# Patient Record
Sex: Female | Born: 1945 | Race: White | Hispanic: No | Smoking: Former smoker
Health system: Southern US, Community
[De-identification: ages and names within clinical notes are randomized; demographics above are authoritative.]

## PROBLEM LIST (undated history)

## (undated) DIAGNOSIS — K7460 Unspecified cirrhosis of liver: Secondary | ICD-10-CM

## (undated) DIAGNOSIS — I8500 Esophageal varices without bleeding: Secondary | ICD-10-CM

## (undated) DIAGNOSIS — C73 Malignant neoplasm of thyroid gland: Secondary | ICD-10-CM

## (undated) DIAGNOSIS — F0390 Unspecified dementia without behavioral disturbance: Secondary | ICD-10-CM

## (undated) DIAGNOSIS — K219 Gastro-esophageal reflux disease without esophagitis: Secondary | ICD-10-CM

## (undated) DIAGNOSIS — I10 Essential (primary) hypertension: Secondary | ICD-10-CM

## (undated) DIAGNOSIS — R413 Other amnesia: Secondary | ICD-10-CM

## (undated) DIAGNOSIS — F319 Bipolar disorder, unspecified: Secondary | ICD-10-CM

## (undated) DIAGNOSIS — S62109A Fracture of unspecified carpal bone, unspecified wrist, initial encounter for closed fracture: Secondary | ICD-10-CM

## (undated) DIAGNOSIS — C801 Malignant (primary) neoplasm, unspecified: Secondary | ICD-10-CM

## (undated) DIAGNOSIS — S0990XA Unspecified injury of head, initial encounter: Secondary | ICD-10-CM

## (undated) DIAGNOSIS — I6529 Occlusion and stenosis of unspecified carotid artery: Secondary | ICD-10-CM

## (undated) HISTORY — DX: Fracture of unspecified carpal bone, unspecified wrist, initial encounter for closed fracture: S62.109A

## (undated) HISTORY — DX: Malignant (primary) neoplasm, unspecified: C80.1

## (undated) HISTORY — DX: Malignant neoplasm of thyroid gland: C73

## (undated) HISTORY — DX: Gastro-esophageal reflux disease without esophagitis: K21.9

## (undated) HISTORY — DX: Essential (primary) hypertension: I10

## (undated) HISTORY — DX: Unspecified injury of head, initial encounter: S09.90XA

## (undated) HISTORY — DX: Bipolar disorder, unspecified: F31.9

## (undated) HISTORY — DX: Other amnesia: R41.3

## (undated) HISTORY — PX: SKIN CANCER EXCISION: SHX779

---

## 2001-07-23 LAB — HM MAMMOGRAPHY

## 2004-03-02 LAB — HM COLONOSCOPY: HM Colonoscopy: DECREASED

## 2004-12-02 HISTORY — PX: CHOLECYSTECTOMY: SHX55

## 2005-01-07 ENCOUNTER — Other Ambulatory Visit: Payer: Self-pay

## 2005-01-08 ENCOUNTER — Inpatient Hospital Stay: Payer: Self-pay

## 2005-01-09 ENCOUNTER — Other Ambulatory Visit: Payer: Self-pay

## 2005-01-31 ENCOUNTER — Emergency Department: Payer: Self-pay | Admitting: Emergency Medicine

## 2005-01-31 ENCOUNTER — Other Ambulatory Visit: Payer: Self-pay

## 2005-02-02 ENCOUNTER — Inpatient Hospital Stay: Payer: Self-pay | Admitting: Internal Medicine

## 2005-02-02 ENCOUNTER — Other Ambulatory Visit: Payer: Self-pay

## 2005-02-06 ENCOUNTER — Ambulatory Visit: Payer: Self-pay | Admitting: Internal Medicine

## 2005-03-05 ENCOUNTER — Inpatient Hospital Stay: Payer: Self-pay

## 2005-03-05 ENCOUNTER — Ambulatory Visit: Payer: Self-pay | Admitting: Nurse Practitioner

## 2005-09-20 ENCOUNTER — Ambulatory Visit: Payer: Self-pay | Admitting: Internal Medicine

## 2005-10-15 ENCOUNTER — Ambulatory Visit: Payer: Self-pay | Admitting: *Deleted

## 2005-10-21 ENCOUNTER — Ambulatory Visit: Payer: Self-pay | Admitting: Internal Medicine

## 2005-11-15 ENCOUNTER — Ambulatory Visit (HOSPITAL_COMMUNITY): Admission: RE | Admit: 2005-11-15 | Discharge: 2005-11-15 | Payer: Self-pay | Admitting: Internal Medicine

## 2005-12-02 HISTORY — PX: HERNIA REPAIR: SHX51

## 2005-12-02 HISTORY — PX: CORONARY STENT PLACEMENT: SHX1402

## 2006-01-23 ENCOUNTER — Other Ambulatory Visit: Payer: Self-pay

## 2006-01-30 ENCOUNTER — Ambulatory Visit: Payer: Self-pay | Admitting: General Surgery

## 2006-02-27 ENCOUNTER — Emergency Department: Payer: Self-pay | Admitting: Emergency Medicine

## 2006-05-05 ENCOUNTER — Emergency Department: Payer: Self-pay | Admitting: Emergency Medicine

## 2006-10-14 ENCOUNTER — Ambulatory Visit (HOSPITAL_COMMUNITY): Admission: RE | Admit: 2006-10-14 | Discharge: 2006-10-14 | Payer: Self-pay | Admitting: Internal Medicine

## 2006-12-02 HISTORY — PX: NASAL SINUS SURGERY: SHX719

## 2006-12-02 HISTORY — PX: COLONOSCOPY: SHX174

## 2007-03-13 ENCOUNTER — Ambulatory Visit: Payer: Self-pay | Admitting: Internal Medicine

## 2008-07-01 ENCOUNTER — Ambulatory Visit: Payer: Self-pay | Admitting: Internal Medicine

## 2008-07-01 ENCOUNTER — Encounter (INDEPENDENT_AMBULATORY_CARE_PROVIDER_SITE_OTHER): Payer: Self-pay | Admitting: Family Medicine

## 2008-07-01 LAB — CONVERTED CEMR LAB
ALT: 20 units/L (ref 0–35)
AST: 29 units/L (ref 0–37)
Albumin: 4.3 g/dL (ref 3.5–5.2)
Alkaline Phosphatase: 97 units/L (ref 39–117)
BUN: 12 mg/dL (ref 6–23)
Basophils Absolute: 0 10*3/uL (ref 0.0–0.1)
Basophils Relative: 1 % (ref 0–1)
CO2: 23 meq/L (ref 19–32)
Calcium: 8.5 mg/dL (ref 8.4–10.5)
Chloride: 107 meq/L (ref 96–112)
Cholesterol: 128 mg/dL (ref 0–200)
Creatinine, Ser: 0.74 mg/dL (ref 0.40–1.20)
Eosinophils Absolute: 0.1 10*3/uL (ref 0.0–0.7)
Eosinophils Relative: 2 % (ref 0–5)
Glucose, Bld: 103 mg/dL — ABNORMAL HIGH (ref 70–99)
HCT: 34.6 % — ABNORMAL LOW (ref 36.0–46.0)
HDL: 45 mg/dL (ref >39)
Hemoglobin: 11.5 g/dL — ABNORMAL LOW (ref 12.0–15.0)
LDL Cholesterol: 51 mg/dL (ref 0–99)
Lymphocytes Relative: 30 % (ref 12–46)
Lymphs Abs: 1.2 10*3/uL (ref 0.7–4.0)
MCHC: 33.2 g/dL (ref 30.0–36.0)
MCV: 98.3 fL (ref 78.0–100.0)
Monocytes Absolute: 0.4 10*3/uL (ref 0.1–1.0)
Monocytes Relative: 9 % (ref 3–12)
Neutro Abs: 2.4 10*3/uL (ref 1.7–7.7)
Neutrophils Relative %: 58 % (ref 43–77)
Platelets: 135 10*3/uL — ABNORMAL LOW (ref 150–400)
Potassium: 4.1 meq/L (ref 3.5–5.3)
RBC: 3.52 M/uL — ABNORMAL LOW (ref 3.87–5.11)
RDW: 13.9 % (ref 11.5–15.5)
Sodium: 141 meq/L (ref 135–145)
TSH: 1.882 microintl units/mL (ref 0.350–4.50)
Total Bilirubin: 0.9 mg/dL (ref 0.3–1.2)
Total CHOL/HDL Ratio: 2.8
Total Protein: 6.7 g/dL (ref 6.0–8.3)
Triglycerides: 160 mg/dL — ABNORMAL HIGH (ref <150)
VLDL: 32 mg/dL (ref 0–40)
WBC: 4.1 10*3/uL (ref 4.0–10.5)

## 2008-07-04 ENCOUNTER — Ambulatory Visit (HOSPITAL_COMMUNITY): Admission: RE | Admit: 2008-07-04 | Discharge: 2008-07-04 | Payer: Self-pay | Admitting: Family Medicine

## 2008-07-19 ENCOUNTER — Ambulatory Visit: Payer: Self-pay

## 2008-07-21 ENCOUNTER — Ambulatory Visit: Payer: Self-pay | Admitting: Otolaryngology

## 2008-07-28 ENCOUNTER — Ambulatory Visit: Payer: Self-pay | Admitting: Otolaryngology

## 2008-09-08 ENCOUNTER — Ambulatory Visit: Payer: Self-pay | Admitting: Family Medicine

## 2009-12-29 LAB — HM PAP SMEAR: HM Pap smear: NEGATIVE

## 2010-07-11 ENCOUNTER — Ambulatory Visit: Payer: Self-pay | Admitting: Cardiology

## 2010-12-02 HISTORY — PX: THYROIDECTOMY: SHX17

## 2011-01-15 ENCOUNTER — Ambulatory Visit: Payer: Self-pay | Admitting: General Surgery

## 2011-01-17 ENCOUNTER — Other Ambulatory Visit: Payer: Self-pay | Admitting: General Surgery

## 2011-01-18 LAB — CEA: CEA: 32.1 ng/mL — ABNORMAL HIGH (ref 0.0–4.7)

## 2011-01-23 ENCOUNTER — Ambulatory Visit: Payer: Self-pay | Admitting: General Surgery

## 2011-01-23 DIAGNOSIS — C73 Malignant neoplasm of thyroid gland: Secondary | ICD-10-CM

## 2011-01-23 HISTORY — DX: Malignant neoplasm of thyroid gland: C73

## 2011-01-25 ENCOUNTER — Other Ambulatory Visit: Payer: Self-pay | Admitting: General Surgery

## 2011-01-31 LAB — PATHOLOGY REPORT

## 2011-05-09 ENCOUNTER — Other Ambulatory Visit: Payer: Self-pay | Admitting: General Surgery

## 2011-06-09 ENCOUNTER — Emergency Department: Payer: Self-pay | Admitting: Emergency Medicine

## 2013-02-27 ENCOUNTER — Other Ambulatory Visit: Payer: Self-pay | Admitting: General Surgery

## 2013-02-27 ENCOUNTER — Emergency Department: Payer: Self-pay | Admitting: Emergency Medicine

## 2013-03-01 NOTE — Telephone Encounter (Signed)
Please approve or deny this refill request.

## 2013-03-02 ENCOUNTER — Inpatient Hospital Stay: Payer: Self-pay | Admitting: Internal Medicine

## 2013-03-02 LAB — COMPREHENSIVE METABOLIC PANEL
Anion Gap: 6 — ABNORMAL LOW (ref 7–16)
BUN: 14 mg/dL (ref 7–18)
Bilirubin,Total: 0.8 mg/dL (ref 0.2–1.0)
Calcium, Total: 8.3 mg/dL — ABNORMAL LOW (ref 8.5–10.1)
Chloride: 105 mmol/L (ref 98–107)
Glucose: 97 mg/dL (ref 65–99)
Osmolality: 276 (ref 275–301)
Potassium: 4.1 mmol/L (ref 3.5–5.1)
SGPT (ALT): 45 U/L (ref 12–78)
Total Protein: 7.1 g/dL (ref 6.4–8.2)

## 2013-03-02 LAB — CBC
HCT: 34.8 % — ABNORMAL LOW (ref 35.0–47.0)
MCH: 32.7 pg (ref 26.0–34.0)
MCHC: 33.6 g/dL (ref 32.0–36.0)
Platelet: 95 10*3/uL — ABNORMAL LOW (ref 150–440)
RBC: 3.59 10*6/uL — ABNORMAL LOW (ref 3.80–5.20)
RDW: 14.1 % (ref 11.5–14.5)
WBC: 3.4 10*3/uL — ABNORMAL LOW (ref 3.6–11.0)

## 2013-03-02 LAB — URINALYSIS, COMPLETE
Glucose,UR: NEGATIVE mg/dL (ref 0–75)
Nitrite: NEGATIVE
Ph: 6 (ref 4.5–8.0)
Protein: NEGATIVE
Specific Gravity: 1.005 (ref 1.003–1.030)
Squamous Epithelial: 1

## 2013-03-02 LAB — TROPONIN I: Troponin-I: <0.02 ng/mL

## 2013-03-03 LAB — CBC WITH DIFFERENTIAL/PLATELET
Eosinophil #: 0 10*3/uL (ref 0.0–0.7)
Eosinophil %: 2.3 %
HGB: 10.6 g/dL — ABNORMAL LOW (ref 12.0–16.0)
Lymphocyte #: 0.8 10*3/uL — ABNORMAL LOW (ref 1.0–3.6)
Lymphocyte %: 38.3 %
MCH: 33.2 pg (ref 26.0–34.0)
MCHC: 34.1 g/dL (ref 32.0–36.0)
Monocyte #: 0.3 x10 3/mm (ref 0.2–0.9)
Monocyte %: 12.3 %
Neutrophil #: 1 10*3/uL — ABNORMAL LOW (ref 1.4–6.5)
RDW: 13.9 % (ref 11.5–14.5)
WBC: 2.1 10*3/uL — ABNORMAL LOW (ref 3.6–11.0)

## 2013-03-03 LAB — BASIC METABOLIC PANEL
Anion Gap: 6 — ABNORMAL LOW (ref 7–16)
BUN: 10 mg/dL (ref 7–18)
Calcium, Total: 7.6 mg/dL — ABNORMAL LOW (ref 8.5–10.1)
Creatinine: 0.87 mg/dL (ref 0.60–1.30)
EGFR (African American): >60
EGFR (Non-African Amer.): >60
Osmolality: 283 (ref 275–301)
Potassium: 3.9 mmol/L (ref 3.5–5.1)

## 2013-03-04 LAB — CBC WITH DIFFERENTIAL/PLATELET
Basophil #: 0 10*3/uL (ref 0.0–0.1)
Basophil %: 0.3 %
Comment - H1-Com1: NORMAL
Comment - H1-Com2: NORMAL
Eosinophil #: 0 10*3/uL (ref 0.0–0.7)
Eosinophil %: 2.6 %
HGB: 10.6 g/dL — ABNORMAL LOW (ref 12.0–16.0)
Lymphocyte #: 0.6 10*3/uL — ABNORMAL LOW (ref 1.0–3.6)
MCH: 32.4 pg (ref 26.0–34.0)
MCHC: 33.4 g/dL (ref 32.0–36.0)
MCV: 97 fL (ref 80–100)
Monocytes: 7 %
Neutrophil %: 49.5 %
Platelet: 58 10*3/uL — ABNORMAL LOW (ref 150–440)
RDW: 13.9 % (ref 11.5–14.5)

## 2013-03-04 LAB — BASIC METABOLIC PANEL
BUN: 7 mg/dL (ref 7–18)
Calcium, Total: 7.4 mg/dL — ABNORMAL LOW (ref 8.5–10.1)
Chloride: 110 mmol/L — ABNORMAL HIGH (ref 98–107)
Creatinine: 0.88 mg/dL (ref 0.60–1.30)
EGFR (Non-African Amer.): >60
Osmolality: 279 (ref 275–301)

## 2013-03-04 LAB — RETICULOCYTES
Absolute Retic Count: 0.0398 10*6/uL
Reticulocyte: 1.24 %

## 2013-03-04 LAB — TSH: Thyroid Stimulating Horm: 4.47 u[IU]/mL

## 2013-03-05 LAB — STOOL CULTURE

## 2013-03-05 LAB — BASIC METABOLIC PANEL
BUN: 6 mg/dL — ABNORMAL LOW (ref 7–18)
Calcium, Total: 7.6 mg/dL — ABNORMAL LOW (ref 8.5–10.1)
Chloride: 112 mmol/L — ABNORMAL HIGH (ref 98–107)
Creatinine: 0.74 mg/dL (ref 0.60–1.30)
EGFR (African American): >60
EGFR (Non-African Amer.): >60
Sodium: 143 mmol/L (ref 136–145)

## 2013-03-05 LAB — CBC WITH DIFFERENTIAL/PLATELET
Comment - H1-Com1: NORMAL
Comment - H1-Com2: NORMAL
Eosinophil: 1 %
HCT: 31.8 % — ABNORMAL LOW (ref 35.0–47.0)
HGB: 10.6 g/dL — ABNORMAL LOW (ref 12.0–16.0)
Lymphocytes: 44 %
MCH: 32.3 pg (ref 26.0–34.0)
Monocytes: 5 %
RBC: 3.29 10*6/uL — ABNORMAL LOW (ref 3.80–5.20)
Segmented Neutrophils: 50 %

## 2013-03-06 LAB — CBC WITH DIFFERENTIAL/PLATELET
Basophil #: 0 10*3/uL (ref 0.0–0.1)
Basophil %: 0.4 %
Eosinophil %: 1.9 %
HCT: 33 % — ABNORMAL LOW (ref 35.0–47.0)
HGB: 11.2 g/dL — ABNORMAL LOW (ref 12.0–16.0)
Lymphocyte #: 0.9 10*3/uL — ABNORMAL LOW (ref 1.0–3.6)
Lymphocyte %: 29.6 %
MCH: 32.8 pg (ref 26.0–34.0)
MCHC: 34.1 g/dL (ref 32.0–36.0)
MCV: 96 fL (ref 80–100)
Monocyte #: 0.3 x10 3/mm (ref 0.2–0.9)
Monocyte %: 9.1 %
Neutrophil #: 1.7 10*3/uL (ref 1.4–6.5)
Neutrophil %: 59 %
RBC: 3.43 10*6/uL — ABNORMAL LOW (ref 3.80–5.20)
RDW: 14 % (ref 11.5–14.5)

## 2013-03-06 LAB — BASIC METABOLIC PANEL
Calcium, Total: 8.1 mg/dL — ABNORMAL LOW (ref 8.5–10.1)
Chloride: 110 mmol/L — ABNORMAL HIGH (ref 98–107)
Co2: 26 mmol/L (ref 21–32)
EGFR (African American): >60
Glucose: 92 mg/dL (ref 65–99)
Osmolality: 280 (ref 275–301)
Potassium: 3.9 mmol/L (ref 3.5–5.1)

## 2013-06-01 ENCOUNTER — Encounter: Payer: Self-pay | Admitting: *Deleted

## 2013-08-16 ENCOUNTER — Emergency Department: Payer: Self-pay | Admitting: Emergency Medicine

## 2013-08-16 LAB — CBC
HGB: 11 g/dL — ABNORMAL LOW (ref 12.0–16.0)
MCH: 32.4 pg (ref 26.0–34.0)
MCV: 97 fL (ref 80–100)
Platelet: 93 10*3/uL — ABNORMAL LOW (ref 150–440)
RDW: 14.4 % (ref 11.5–14.5)
WBC: 6.4 10*3/uL (ref 3.6–11.0)

## 2013-08-16 LAB — BASIC METABOLIC PANEL
BUN: 14 mg/dL (ref 7–18)
Calcium, Total: 8.6 mg/dL (ref 8.5–10.1)
EGFR (African American): >60
Glucose: 109 mg/dL — ABNORMAL HIGH (ref 65–99)
Potassium: 4 mmol/L (ref 3.5–5.1)
Sodium: 139 mmol/L (ref 136–145)

## 2013-08-16 LAB — TROPONIN I: Troponin-I: <0.02 ng/mL

## 2013-08-16 LAB — PROTIME-INR: Prothrombin Time: 14.8 secs — ABNORMAL HIGH (ref 11.5–14.7)

## 2013-08-19 ENCOUNTER — Ambulatory Visit: Payer: Self-pay | Admitting: Vascular Surgery

## 2013-11-06 ENCOUNTER — Other Ambulatory Visit: Payer: Self-pay | Admitting: General Surgery

## 2013-12-16 ENCOUNTER — Other Ambulatory Visit: Payer: Self-pay

## 2013-12-16 DIAGNOSIS — Z85850 Personal history of malignant neoplasm of thyroid: Secondary | ICD-10-CM

## 2014-01-07 LAB — CALCITONIN: Calcitonin: 5 pg/mL (ref 0.0–5.0)

## 2014-01-07 LAB — T4 AND TSH
T4, Total: 10.2 ug/dL (ref 4.5–12.0)
TSH: 15.59 u[IU]/mL — ABNORMAL HIGH (ref 0.450–4.500)

## 2014-01-07 LAB — CEA: CEA: 2.5 ng/mL (ref 0.0–4.7)

## 2014-01-12 ENCOUNTER — Encounter: Payer: Self-pay | Admitting: General Surgery

## 2014-01-12 ENCOUNTER — Ambulatory Visit (INDEPENDENT_AMBULATORY_CARE_PROVIDER_SITE_OTHER): Payer: Medicare Other | Admitting: General Surgery

## 2014-01-12 VITALS — BP 140/66 | HR 76 | Resp 14 | Ht 63.0 in | Wt 137.0 lb

## 2014-01-12 DIAGNOSIS — Z85850 Personal history of malignant neoplasm of thyroid: Secondary | ICD-10-CM

## 2014-01-12 NOTE — Patient Instructions (Signed)
The patient is aware to call back for any questions or concerns.  

## 2014-01-12 NOTE — Progress Notes (Signed)
Patient ID: Margaret Romero, female   DOB: 13-Jun-1946, 68 y.o.   MRN: [id number]  Chief Complaint  Patient presents with  . Follow-up    thyroid cancer    HPI Margaret Romero is a 68 y.o. female.  Here today for follow up thyroid cancer.  The patient reports she is feeling well. She is accompanied by her daughter and).  Followed by Dr Delana Meyer for carotid artery last visit was November, no surgery needed. No new complaints. HPI  Past Medical History  Diagnosis Date  . Heart disease   . Hypertension   . Bipolar disorder   . 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 GERD (gastroesophageal reflux disease)   . Malignant neoplasm of thyroid gland January 23, 2011    medullary carcinoma thyroid T2, Nx.    Past Surgical History  Procedure Laterality Date  . Hernia repair  2007  . Coronary stent placement  2007  . Cholecystectomy  2006  . Thyroidectomy  2012  . Colonoscopy  2008  . Nasal sinus surgery  2008    No family history on file.  Social History History  Substance Use Topics  . Smoking status: Former Smoker -- 1.00 packs/day for 10 years  . Smokeless tobacco: Never Used  . Alcohol Use: No    No Known Allergies  Current Outpatient Prescriptions  Medication Sig Dispense Refill  . aspirin 81 MG tablet Take 81 mg by mouth daily.      . DELZICOL 400 MG CPDR DR capsule       . FLUoxetine (PROZAC) 20 MG capsule Take 1 capsule by mouth 3 x daily with food.      . isosorbide mononitrate (IMDUR) 30 MG 24 hr tablet Take 1 tablet by mouth daily.      Marland Kitchen levothyroxine (SYNTHROID, LEVOTHROID) 75 MCG tablet TAKE ONE TABLET BY MOUTH EVERY DAY  30 tablet  6  . losartan (COZAAR) 50 MG tablet Take 1 tablet by mouth daily.      . Multiple Vitamins-Minerals (MULTIVITAMIN WITH MINERALS) tablet Take 1 tablet by mouth daily.      . pantoprazole (PROTONIX) 40 MG tablet Take 1 tablet by mouth daily.      . simvastatin (ZOCOR) 40 MG tablet Take 1  tablet by mouth daily.       No current facility-administered medications for this visit.    Review of Systems Review of Systems  Constitutional: Negative.   Respiratory: Negative.   Cardiovascular: Negative.     Blood pressure 140/66, pulse 76, resp. rate 14, height 5' 3"  (1.6 m), weight 137 lb (62.143 kg).  Physical Exam Physical Exam  Constitutional: She is oriented to person, place, and time. She appears well-developed and well-nourished.  Neck: Neck supple. No mass (No palpable thyroidal tissue is appreciated.no adenopathy in the central neck appreciated.) present.  Cardiovascular: Normal rate, regular rhythm and normal heart sounds.   Pulmonary/Chest: [facility] normal and breath sounds normal.  Lymphadenopathy:    She has no cervical adenopathy.  Neurological: She is alert and oriented to person, place, and time.  Skin: Skin is warm and dry.    Data Reviewed Laboratory studies show T4 in a normal range of 10.2. TSH was elevated 15. Calcitonin was at the upper limit of normal at 5.0 pg/mL. CEA: 2.5.  Assessment    Doing well status post total thyroidectomy for medullary carcinoma thyroid.     Plan    We'll plan for  a followup examination with repeat laboratory studies in one year.        [name] 01/13/2014, 8:12 PM

## 2014-01-13 ENCOUNTER — Encounter: Payer: Self-pay | Admitting: General Surgery

## 2014-01-13 DIAGNOSIS — Z85850 Personal history of malignant neoplasm of thyroid: Secondary | ICD-10-CM | POA: Insufficient documentation

## 2014-01-17 ENCOUNTER — Encounter: Payer: Self-pay | Admitting: General Surgery

## 2014-04-08 ENCOUNTER — Emergency Department: Payer: Self-pay | Admitting: Emergency Medicine

## 2014-07-22 LAB — BASIC METABOLIC PANEL
BUN: 10 mg/dL (ref 4–21)
Creatinine: 0.8 mg/dL (ref 0.5–1.1)
Glucose: 99 mg/dL
POTASSIUM: 3.7 mmol/L (ref 3.4–5.3)
Sodium: 146 mmol/L (ref 137–147)

## 2014-07-22 LAB — CBC AND DIFFERENTIAL
HCT: 12 % — AB (ref 36–46)
HEMOGLOBIN: 10.8 g/dL — AB (ref 12.0–16.0)
PLATELETS: 119 10*3/uL — AB (ref 150–399)
WBC: 5.8 10*3/mL

## 2014-09-01 ENCOUNTER — Ambulatory Visit: Payer: Self-pay | Admitting: Vascular Surgery

## 2014-09-14 ENCOUNTER — Ambulatory Visit: Payer: Self-pay | Admitting: Vascular Surgery

## 2014-09-14 LAB — BASIC METABOLIC PANEL
Anion Gap: 3 — ABNORMAL LOW (ref 7–16)
BUN: 10 mg/dL (ref 7–18)
CHLORIDE: 112 mmol/L — AB (ref 98–107)
CO2: 31 mmol/L (ref 21–32)
Calcium, Total: 8.2 mg/dL — ABNORMAL LOW (ref 8.5–10.1)
Creatinine: 0.85 mg/dL (ref 0.60–1.30)
EGFR (African American): >60
EGFR (Non-African Amer.): >60
Glucose: 88 mg/dL (ref 65–99)
OSMOLALITY: 289 (ref 275–301)
Potassium: 3.9 mmol/L (ref 3.5–5.1)
SODIUM: 146 mmol/L — AB (ref 136–145)

## 2014-09-30 ENCOUNTER — Ambulatory Visit: Payer: Self-pay | Admitting: Vascular Surgery

## 2014-10-02 ENCOUNTER — Emergency Department: Payer: Self-pay | Admitting: Emergency Medicine

## 2014-10-02 HISTORY — PX: CAROTID ENDARTERECTOMY: SUR193

## 2014-10-03 ENCOUNTER — Encounter: Payer: Self-pay | Admitting: General Surgery

## 2014-10-05 ENCOUNTER — Ambulatory Visit: Payer: Self-pay | Admitting: Vascular Surgery

## 2014-10-05 LAB — CBC WITH DIFFERENTIAL/PLATELET
BASOS ABS: 0 10*3/uL (ref 0.0–0.1)
BASOS PCT: 0.5 %
EOS ABS: 0.1 10*3/uL (ref 0.0–0.7)
EOS PCT: 1.7 %
HCT: 32.5 % — ABNORMAL LOW (ref 35.0–47.0)
HGB: 10.5 g/dL — ABNORMAL LOW (ref 12.0–16.0)
LYMPHS ABS: 0.8 10*3/uL — AB (ref 1.0–3.6)
LYMPHS PCT: 22.9 %
MCH: 31.3 pg (ref 26.0–34.0)
MCHC: 32.2 g/dL (ref 32.0–36.0)
MCV: 97 fL (ref 80–100)
Monocyte #: 0.2 x10 3/mm (ref 0.2–0.9)
Monocyte %: 6.9 %
NEUTROS PCT: 68 %
Neutrophil #: 2.3 10*3/uL (ref 1.4–6.5)
PLATELETS: 87 10*3/uL — AB (ref 150–440)
RBC: 3.35 10*6/uL — ABNORMAL LOW (ref 3.80–5.20)
RDW: 15.7 % — AB (ref 11.5–14.5)
WBC: 3.4 10*3/uL — ABNORMAL LOW (ref 3.6–11.0)

## 2014-10-11 ENCOUNTER — Ambulatory Visit: Payer: Self-pay | Admitting: Vascular Surgery

## 2014-10-14 ENCOUNTER — Inpatient Hospital Stay: Payer: Self-pay | Admitting: Vascular Surgery

## 2014-10-15 LAB — CBC WITH DIFFERENTIAL/PLATELET
Basophil #: 0 10*3/uL (ref 0.0–0.1)
Basophil %: 0.3 %
EOS ABS: 0 10*3/uL (ref 0.0–0.7)
EOS PCT: 0.1 %
HCT: 31.3 % — AB (ref 35.0–47.0)
HGB: 10.1 g/dL — AB (ref 12.0–16.0)
LYMPHS PCT: 17 %
Lymphocyte #: 0.7 10*3/uL — ABNORMAL LOW (ref 1.0–3.6)
MCH: 31.2 pg (ref 26.0–34.0)
MCHC: 32.2 g/dL (ref 32.0–36.0)
MCV: 97 fL (ref 80–100)
MONO ABS: 0.3 x10 3/mm (ref 0.2–0.9)
MONOS PCT: 5.8 %
Neutrophil #: 3.3 10*3/uL (ref 1.4–6.5)
Neutrophil %: 76.8 %
Platelet: 77 10*3/uL — ABNORMAL LOW (ref 150–440)
RBC: 3.24 10*6/uL — AB (ref 3.80–5.20)
RDW: 15.5 % — AB (ref 11.5–14.5)
WBC: 4.3 10*3/uL (ref 3.6–11.0)

## 2014-10-15 LAB — BASIC METABOLIC PANEL
ANION GAP: 8 (ref 7–16)
BUN: 15 mg/dL (ref 7–18)
CHLORIDE: 110 mmol/L — AB (ref 98–107)
Calcium, Total: 7.6 mg/dL — ABNORMAL LOW (ref 8.5–10.1)
Co2: 27 mmol/L (ref 21–32)
Creatinine: 0.99 mg/dL (ref 0.60–1.30)
EGFR (African American): >60
GFR CALC NON AF AMER: 59 — AB
GLUCOSE: 94 mg/dL (ref 65–99)
Osmolality: 289 (ref 275–301)
POTASSIUM: 4.1 mmol/L (ref 3.5–5.1)
Sodium: 145 mmol/L (ref 136–145)

## 2014-10-15 LAB — APTT: Activated PTT: 31.6 secs (ref 23.6–35.9)

## 2014-10-15 LAB — PROTIME-INR
INR: 1.3
Prothrombin Time: 15.5 secs — ABNORMAL HIGH (ref 11.5–14.7)

## 2014-10-15 LAB — DG CHEST 1 VIEW

## 2014-10-17 DIAGNOSIS — J9601 Acute respiratory failure with hypoxia: Secondary | ICD-10-CM

## 2014-10-17 DIAGNOSIS — I340 Nonrheumatic mitral (valve) insufficiency: Secondary | ICD-10-CM

## 2014-10-19 ENCOUNTER — Telehealth: Payer: Self-pay | Admitting: Internal Medicine

## 2014-10-19 NOTE — Telephone Encounter (Signed)
I spoke w/ [facility] @ [facility].  VM has not seen pt outside of [facility].  I gave [facility] VM's pager number as instructed by Joellen Jersey.  Nothing further needed at this time.  Satira Anis

## 2014-10-19 NOTE — Telephone Encounter (Signed)
ATC x 3 fast busy signal wcb

## 2014-10-20 ENCOUNTER — Telehealth: Payer: Self-pay | Admitting: Internal Medicine

## 2014-10-20 NOTE — Telephone Encounter (Signed)
Spoke with the pt's caregiver  She states that the pt was advised by Dr. Stevenson Clinch to only use o2 2lpm with exertion  Then, 2 days later when she was d/c'ed from [facility], her d/c instructions were to use the o2 2lpm 24/7  Pt is doing well with this, but wonders which is correct- the prn use of o2 or 24/7 use  She has HFU in scheduled for 10/31/14 with Dr Stevenson Clinch I advised to use the o2 as they instructed on d/c until further notice  She verbalized understanding and okay with this plan  Dr Stevenson Clinch, please advise, thanks!!

## 2014-10-20 NOTE — Telephone Encounter (Signed)
Based on results from her partial 20mn walk test in the hospital she will require 2L of O2 with exertion (any exertion) and 1L PRN at rest. Orders will be adjusted once she follows up with me.

## 2014-10-21 ENCOUNTER — Telehealth: Payer: Self-pay | Admitting: Internal Medicine

## 2014-10-21 NOTE — Telephone Encounter (Signed)
Duplicate phone note. Please see note from 10/20/14. Will sign off.

## 2014-10-21 NOTE — Telephone Encounter (Signed)
Pt returned call. Informed Flonnie of the recs per VM. Flonnie verbalized understanding with readback. Nothing further needed.

## 2014-10-21 NOTE — Telephone Encounter (Signed)
LMTCB for [employer]

## 2014-10-31 ENCOUNTER — Encounter: Payer: Self-pay | Admitting: Internal Medicine

## 2014-10-31 ENCOUNTER — Telehealth: Payer: Self-pay | Admitting: *Deleted

## 2014-10-31 ENCOUNTER — Inpatient Hospital Stay: Payer: Self-pay | Admitting: Internal Medicine

## 2014-10-31 ENCOUNTER — Ambulatory Visit (INDEPENDENT_AMBULATORY_CARE_PROVIDER_SITE_OTHER): Payer: Medicare Other | Admitting: Internal Medicine

## 2014-10-31 VITALS — BP 142/60 | HR 69 | Ht 63.0 in | Wt 140.0 lb

## 2014-10-31 DIAGNOSIS — R0602 Shortness of breath: Secondary | ICD-10-CM

## 2014-10-31 DIAGNOSIS — R0902 Hypoxemia: Secondary | ICD-10-CM

## 2014-10-31 NOTE — Patient Instructions (Addendum)
We will set you up for a breathing test at [facility]. We will get you set up for overnight oximetry as well. Please schedule a  6-8 week follow-up with Dr. Stevenson Clinch. You may discontinue use of oxygen.

## 2014-10-31 NOTE — Progress Notes (Deleted)
blank

## 2014-10-31 NOTE — Telephone Encounter (Signed)
I called patient per Dr. Merian Capron direction. He feels that the patient could be best evaluated in one month. He would like to reschedule today's office visit for 2 more weeks out. If the patient feels that she needs to be seen today and or has made preparations he will still see her. I left message on her daughter's number.

## 2014-10-31 NOTE — Progress Notes (Signed)
Resting hr 97 Resting Pulse 69 02   HR 02 HR 96 81 95 94  96 89 95 95  95 93 96 91  95 94 96 82 94 91

## 2014-11-01 ENCOUNTER — Encounter: Payer: Self-pay | Admitting: Internal Medicine

## 2014-11-01 DIAGNOSIS — R0602 Shortness of breath: Secondary | ICD-10-CM | POA: Insufficient documentation

## 2014-11-01 DIAGNOSIS — R0902 Hypoxemia: Secondary | ICD-10-CM | POA: Insufficient documentation

## 2014-11-01 NOTE — Assessment & Plan Note (Addendum)
Postop secondary to atelectasis. Now resolving-not requiring oxygen at rest or with exertion. STOP supplemental oxygen (no desaturation events on 6MWT) Plan for overnight pulse oximetry testing.

## 2014-11-01 NOTE — Progress Notes (Signed)
MRN# [id number] Margaret Romero [birth year]   CC:"follow up from hospital and see if I still need oxygen" Chief Complaint  Patient presents with  . Hospitalization Follow-up    Patient here for 6 min walk eval to monitor 02 levels. She has not used 02 for the past 2 nights. She has not used since 11/21 during the day.      Brief History: Ms. Dunford presents today for hospital follow-up, along with her friend, who is HCPOA. See below for Hospital history. Patient was recently admitted for right carotid endarterectomy, postop she was noted to be hypoxic, mainly with ambulation descending down to 78%, and initially requiring 2 L of oxygen with exertion. She was also requiring 1 L of oxygen at night due to desats at sleep.  Upon discharge, she needed 1 L of oxygen with exertion and sleep. CT during hospitalization showed bilateral basal atelectasis. In the office for 6 minute walk test showed no desaturations. Patient states overall she is doing well, and has a portable pulse oximetry monitor with her at times , that has shown no desaturation at rest or with exertion over the past 1 week.  She does have a history of smoking, quit in 1975, previously smoked 1-2 packs per day. Prior to hospitalization she had no cough, no wheeze, no sputum production, she has mild dyspnea on exertion, no shortness of breath at rest.   Hospital Consult HPI : Date of Service - 10/14/14-10/19/14 10/17/14 This is a very pleasant, 68 year old female with a history of carotid artery stenosis, postoperative day #4 for right CEA, hypertension, hypothyroidism. [facility] was consulted for postoperative hypoxia especially with ambulation. Since her procedure on 10/15/14, she was noted to be about 87% to 91% on room air, then started desaturation over the weekend.  Currently, she has desaturation on room air, but with exertion.  A CT Chest showed no PE but moderate basilar bilateral atelecatsis and small bilateral pleural  effusion, with diffuse centrilobular emphysema. . The patient is a very active woman, who does gardening and has no history of lung disease. She had no respiratory issues prior to coming into the hospital. She said she was doing fine. No cough, wheezing, hemoptysis.  She states that she smoked 1-2ppd x 15 years, quit in 1975, has occasional second-hand smoke exposure (once every 3 weeks, she does house cleaning at times). She lives with a friend who does not smoke.       PMHX:   Past Medical History  Diagnosis Date  . Heart disease   . Hypertension   . Bipolar disorder   . 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 GERD (gastroesophageal reflux disease)   . Malignant neoplasm of thyroid gland January 23, 2011    medullary carcinoma thyroid T2, Nx.   Surgical Hx:  Past Surgical History  Procedure Laterality Date  . Hernia repair  2007  . Coronary stent placement  2007  . Cholecystectomy  2006  . Thyroidectomy  2012  . Colonoscopy  2008  . Nasal sinus surgery  2008   Family Hx:  No family history on file. Social Hx:   History  Substance Use Topics  . Smoking status: Former Smoker -- 1.00 packs/day for 10 years  . Smokeless tobacco: Never Used  . Alcohol Use: No   Medication:   Current Outpatient Rx  Name  Route  Sig  Dispense  Refill  . aspirin 81 MG tablet   Oral  Take 81 mg by mouth daily.         . DELZICOL 400 MG CPDR DR capsule               . escitalopram (LEXAPRO) 10 MG tablet   Oral   Take 10 mg by mouth daily.         . Influenza Vac Split High-Dose (FLUZONE HIGH-DOSE) 0.5 ML SUSY   Intramuscular   Inject 0.5 mLs into the muscle once.         . isosorbide mononitrate (IMDUR) 30 MG 24 hr tablet   Oral   Take 1 tablet by mouth daily.         Marland Kitchen levothyroxine (SYNTHROID, LEVOTHROID) 75 MCG tablet      TAKE ONE TABLET BY MOUTH EVERY DAY   30 tablet   6   . losartan (COZAAR) 50 MG tablet   Oral    Take 1 tablet by mouth daily.         . Multiple Vitamins-Minerals (MULTIVITAMIN WITH MINERALS) tablet   Oral   Take 1 tablet by mouth daily.         . pantoprazole (PROTONIX) 40 MG tablet   Oral   Take 1 tablet by mouth daily.         . simvastatin (ZOCOR) 40 MG tablet   Oral   Take 1 tablet by mouth daily.         . nitroGLYCERIN (NITROSTAT) 0.4 MG SL tablet   Sublingual   Place 0.4 mg under the tongue as needed.            Review of Systems: Gen:  Denies  fever, sweats, chills HEENT: Denies blurred vision, double vision, ear pain, eye pain, hearing loss, nose bleeds, sore throat Cvc:  No dizziness, chest pain or heaviness Resp:   Mild sob Gi: Denies swallowing difficulty, stomach pain, nausea or vomiting, diarrhea, constipation, bowel incontinence Gu:  Denies bladder incontinence, burning urine Ext:   No Joint pain, stiffness or swelling Skin: No skin rash, easy bruising or bleeding or hives Endoc:  No polyuria, polydipsia , polyphagia or weight change Psych: No depression, insomnia or hallucinations  Other:  All other systems negative  Allergies:  Review of patient's allergies indicates no known allergies.  Physical Examination:  VS: BP 142/60 mmHg  Pulse 69  Ht 5' 3"  (1.6 m)  Wt 140 lb (63.504 kg)  BMI 24.81 kg/m2  SpO2 96%  General Appearance: No distress  HEENT: PERRLA, EOM intact, no ptosis, Right sided neck scar (5inches) with sutures in place, mild erythema, no drainage Pulmonary:Exam: normal breath sounds., diaphragmatic excursion normal.No wheezing, No rales   Cardiovascular:@ Exam:  Normal S1,S2.  No m/r/g.     Abdomen:Exam: Benign, Soft, non-tender, No masses  Skin:   warm, no rashes, no ecchymosis  Extremities: normal, no cyanosis, clubbing, no edema, warm with normal capillary refill.   Labs results:  BMP Lab Results  Component Value Date   NA 141 07/01/2008   K 4.1 07/01/2008   CL 107 07/01/2008   CO2 23 07/01/2008   GLUCOSE 103*  07/01/2008   BUN 12 07/01/2008   CREATININE 0.74 07/01/2008     CBC CBC Latest Ref Rng 07/01/2008  WBC 4.0-10.5 10*3/microliter 4.1  Hemoglobin 12.0-15.0 g/dL 11.5(L)  Hematocrit 36.0-46.0 % 34.6(L)  Platelets 150-400 K/uL 135(L)     Rad results: (The following images and results were reviewed by Dr. Stevenson Clinch). CT chest 10/16/2014 COMPARISON:  Multiple chest radiographs,  including 1 day prior. No prior CT.  FINDINGS: Lungs/Pleura: Mild motion degradation throughout. Moderate centrilobular emphysema.  Biapical pleural parenchymal scarring, greater on the right.  Right greater than left bibasilar airspace disease. Right greater left small bilateral pleural effusions.  Heart/Mediastinum: The quality of this examination for evaluation of pulmonary embolism is good. No evidence of pulmonary embolism.  Advanced carotid atherosclerosis bilaterally. Prior thyroidectomy. Small left low jugular nodes which were suboptimally evaluated.  Advanced atherosclerosis within the aorta and branch vessels, including the left common carotid and subclavian arteries. Mild cardiomegaly, without pericardial effusion. Multivessel coronary artery atherosclerosis. No mediastinal or hilar adenopathy. Small hiatal hernia. Prevascular mediastinal interstitial thickening is nonspecific, including on image 48 of series 3. Upper Abdomen: Advanced cirrhosis. Probable splenomegaly. Nonspecific hypo attenuating 12 mm low-density central splenic lesion. Normal imaged portions of the stomach, adrenal glands, kidneys. Tortuous enlarged portal vein, suboptimally evaluated secondary to bolus timing. Mildly prominent porta hepatis nodes which are likely reactive.  Bones/Musculoskeletal:  Mild wedging of T8 and T9 vertebral bodies.  Review of the MIP images confirms the above findings.   IMPRESSION: 1.  No evidence of pulmonary embolism. 2. Bibasilar airspace disease is suspicious for infection or aspiration.  Small bilateral pleural effusions. Recommend radiographic follow-up until clearing. 3. Advanced cirrhosis with probable portal venous hypertension. 4. Advanced atherosclerosis, including within the coronary arteries. 5. Interstitial thickening in the anterior mediastinum. Likely related to recent surgery.  ECHO 10/17/14 Summary:  1. Left ventricular ejection fraction, by visual estimation, is 60 to  65%.  2. Normal global left ventricular systolic function.  3. Normal right ventricular size and systolic function.  4. Moderately dilated left atrium.  5. Mild to moderate mitral valve regurgitation.  6. Mild tricuspid regurgitation.  7. Mildly elevated pulmonary artery systolic pressure.     Assessment and Plan: Hypoxia Postop secondary to atelectasis. Now resolving-not requiring oxygen at rest or with exertion. STOP supplemental oxygen (no desaturation events on 6MWT) Plan for overnight pulse oximetry testing.  SOB (shortness of breath) on exertion Multifactorial: Postoperative atelectasis, history of smoking, deconditioning, possible COPD  Patient is now improving, shortness of breath is almost resolved, and she is almost back to baseline breathing.  Plan: - Patient does have a previous significant smoking history, may have some obstructive disease. Will plan for further evaluation with full pulmonary function testing. - Overnight pulse oximetry - Discuss sleep habits and screened for sleep apnea at next visit.   Updated Medication List Outpatient Encounter Prescriptions as of 10/31/2014  Medication Sig  . aspirin 81 MG tablet Take 81 mg by mouth daily.  . DELZICOL 400 MG CPDR DR capsule   . escitalopram (LEXAPRO) 10 MG tablet Take 10 mg by mouth daily.  . Influenza Vac Split High-Dose (FLUZONE HIGH-DOSE) 0.5 ML SUSY Inject 0.5 mLs into the muscle once.  . isosorbide mononitrate (IMDUR) 30 MG 24 hr tablet Take 1 tablet by mouth daily.  Marland Kitchen levothyroxine (SYNTHROID,  LEVOTHROID) 75 MCG tablet TAKE ONE TABLET BY MOUTH EVERY DAY  . losartan (COZAAR) 50 MG tablet Take 1 tablet by mouth daily.  . Multiple Vitamins-Minerals (MULTIVITAMIN WITH MINERALS) tablet Take 1 tablet by mouth daily.  . pantoprazole (PROTONIX) 40 MG tablet Take 1 tablet by mouth daily.  . simvastatin (ZOCOR) 40 MG tablet Take 1 tablet by mouth daily.  . nitroGLYCERIN (NITROSTAT) 0.4 MG SL tablet Place 0.4 mg under the tongue as needed.  . [DISCONTINUED] FLUoxetine (PROZAC) 20 MG capsule Take 1 capsule by mouth 3  x daily with food.    Orders for this visit: Orders Placed This Encounter  Procedures  . Ambulatory Referral for DME    Referral Priority:  Routine    Referral Type:  Durable Medical Equipment Purchase    Number of Visits Requested:  1  . Ambulatory referral to Respiratory Therapy    Referral Priority:  Routine    Referral Type:  Consultation    Referral Reason:  Specialty Services Required    Requested Specialty:  Respiratory Therapy    Number of Visits Requested:  1  . Pulmonary function test    Standing Status: Future     Number of Occurrences:      Standing Expiration Date: 11/01/2015    Scheduling Instructions:     PLEASE SCHEDULE MID JANU 2016. DX DYSPNEA    Order Specific Question:  Where should this test be performed?    Answer:  Other    Order Specific Question:  Full PFT: includes the following: basic spirometry, spirometry pre & post bronchodilator, diffusion capacity (DLCO), lung volumes    Answer:  Full PFT    Order Specific Question:  MIP/MEP    Answer:  No    Order Specific Question:  6 minute walk    Answer:  No    Order Specific Question:  ABG    Answer:  No    Order Specific Question:  Diffusion capacity (DLCO)    Answer:  No    Order Specific Question:  Lung volumes    Answer:  No    Order Specific Question:  Methacholine challenge    Answer:  No    Thank  you for the visitation and for allowing  [facility] to assist  in the care of your patient. Our recommendations are noted above.  Please contact us if we can be of further service.  Vilinda Boehringer, MD [facility] Office Number: [phone]

## 2014-11-01 NOTE — Assessment & Plan Note (Signed)
Multifactorial: Postoperative atelectasis, history of smoking, deconditioning, possible COPD  Patient is now improving, shortness of breath is almost resolved, and she is almost back to baseline breathing.  Plan: - Patient does have a previous significant smoking history, may have some obstructive disease. Will plan for further evaluation with full pulmonary function testing. - Overnight pulse oximetry - Discuss sleep habits and screened for sleep apnea at next visit.

## 2014-11-09 ENCOUNTER — Telehealth: Payer: Self-pay | Admitting: Internal Medicine

## 2014-11-09 DIAGNOSIS — R0902 Hypoxemia: Secondary | ICD-10-CM

## 2014-11-09 NOTE — Telephone Encounter (Signed)
Called and spoke with [facility] and she stated that Dr. Stevenson Clinch stopped the pt from using the [place].  She stated that the pt will have to make 2 more co-pays for the oxygen and would like to have [facility] come and pick this up if she is not going to be on this.  Dr. Stevenson Clinch please advise. Thanks  No Known Allergies  Current Outpatient Prescriptions on File Prior to Visit  Medication Sig Dispense Refill  . aspirin 81 MG tablet Take 81 mg by mouth daily.    . DELZICOL 400 MG CPDR DR capsule     . escitalopram (LEXAPRO) 10 MG tablet Take 10 mg by mouth daily.    . Influenza Vac Split High-Dose (FLUZONE HIGH-DOSE) 0.5 ML SUSY Inject 0.5 mLs into the muscle once.    . isosorbide mononitrate (IMDUR) 30 MG 24 hr tablet Take 1 tablet by mouth daily.    Marland Kitchen levothyroxine (SYNTHROID, LEVOTHROID) 75 MCG tablet TAKE ONE TABLET BY MOUTH EVERY DAY 30 tablet 6  . losartan (COZAAR) 50 MG tablet Take 1 tablet by mouth daily.    . Multiple Vitamins-Minerals (MULTIVITAMIN WITH MINERALS) tablet Take 1 tablet by mouth daily.    . nitroGLYCERIN (NITROSTAT) 0.4 MG SL tablet Place 0.4 mg under the tongue as needed.    . pantoprazole (PROTONIX) 40 MG tablet Take 1 tablet by mouth daily.    . simvastatin (ZOCOR) 40 MG tablet Take 1 tablet by mouth daily.     No current facility-administered medications on file prior to visit.

## 2014-11-10 ENCOUNTER — Other Ambulatory Visit: Payer: Self-pay | Admitting: Internal Medicine

## 2014-11-10 DIAGNOSIS — J9601 Acute respiratory failure with hypoxia: Secondary | ICD-10-CM

## 2014-11-10 NOTE — Telephone Encounter (Signed)
Patient does not require oxygen. She needed it temporarily after her surgery, but now is back to her baseline respiratory status. D\C oxygen and return tanks to DME.  See my clinic note for full details.

## 2014-11-10 NOTE — Addendum Note (Signed)
Addended by: Elie Confer on: 11/10/2014 11:10 AM   Modules accepted: Orders

## 2014-11-30 ENCOUNTER — Telehealth: Payer: Self-pay | Admitting: Internal Medicine

## 2014-12-01 NOTE — Telephone Encounter (Signed)
Left message to call back. x1

## 2014-12-05 NOTE — Telephone Encounter (Signed)
PFT has been scheduled for 12/22/14 at 4pm.

## 2014-12-07 ENCOUNTER — Other Ambulatory Visit: Payer: Self-pay

## 2014-12-07 DIAGNOSIS — Z85850 Personal history of malignant neoplasm of thyroid: Secondary | ICD-10-CM

## 2014-12-14 ENCOUNTER — Telehealth: Payer: Self-pay | Admitting: Internal Medicine

## 2014-12-14 NOTE — Telephone Encounter (Signed)
Margaret Pique do you know why this pt was called?  i cant find anything in the chart.

## 2014-12-15 DIAGNOSIS — K5190 Ulcerative colitis, unspecified, without complications: Secondary | ICD-10-CM | POA: Diagnosis not present

## 2014-12-15 DIAGNOSIS — R195 Other fecal abnormalities: Secondary | ICD-10-CM | POA: Insufficient documentation

## 2014-12-15 DIAGNOSIS — D649 Anemia, unspecified: Secondary | ICD-10-CM | POA: Insufficient documentation

## 2014-12-15 NOTE — Telephone Encounter (Signed)
Dr. Merian Capron schedule changed on 12/22/14. Ria Comment called this patient because he is having morning clinic only. This patient was scheduled for PFT that afternoon that needs to be rescheduled. Thanks.

## 2014-12-15 NOTE — Telephone Encounter (Signed)
Pt needed morning appt for pft. Scheduled & per Ria Comment closed out the note. spm

## 2014-12-22 ENCOUNTER — Ambulatory Visit (INDEPENDENT_AMBULATORY_CARE_PROVIDER_SITE_OTHER): Payer: Medicare Other | Admitting: Internal Medicine

## 2014-12-22 ENCOUNTER — Encounter: Payer: Self-pay | Admitting: Internal Medicine

## 2014-12-22 VITALS — BP 150/52 | HR 76 | Ht 63.0 in | Wt 140.0 lb

## 2014-12-22 DIAGNOSIS — R0602 Shortness of breath: Secondary | ICD-10-CM | POA: Diagnosis not present

## 2014-12-22 LAB — PULMONARY FUNCTION TEST
DL/VA % pred: 50 %
DL/VA: 2.39 ml/min/mmHg/L
DLCO unc % pred: 59 %
DLCO unc: 13.55 ml/min/mmHg
FEF 25-75 Post: 2.6 L/sec
FEF 25-75 Pre: 1.26 L/sec
FEF2575-%Change-Post: 105 %
FEF2575-%Pred-Post: 142 %
FEF2575-%Pred-Pre: 69 %
FEV1-%Change-Post: 19 %
FEV1-%Pred-Post: 112 %
FEV1-%Pred-Pre: 93 %
FEV1-POST: 2.42 L
FEV1-Pre: 2.02 L
FEV1FVC-%CHANGE-POST: 5 %
FEV1FVC-%Pred-Pre: 92 %
FEV6-%Change-Post: 15 %
FEV6-%PRED-PRE: 104 %
FEV6-%Pred-Post: 119 %
FEV6-Post: 3.28 L
FEV6-Pre: 2.84 L
FEV6FVC-%Change-Post: 1 %
FEV6FVC-%Pred-Post: 105 %
FEV6FVC-%Pred-Pre: 103 %
FVC-%Change-Post: 13 %
FVC-%PRED-PRE: 100 %
FVC-%Pred-Post: 114 %
FVC-PRE: 2.88 L
FVC-Post: 3.28 L
Post FEV1/FVC ratio: 74 %
Post FEV6/FVC ratio: 100 %
Pre FEV1/FVC ratio: 70 %
Pre FEV6/FVC Ratio: 99 %
RV % pred: 122 %
RV: 2.63 L
TLC % pred: 123 %
TLC: 6.06 L

## 2014-12-22 MED ORDER — ALBUTEROL SULFATE HFA 108 (90 BASE) MCG/ACT IN AERS: 2.0000 | INHALATION_SPRAY | RESPIRATORY_TRACT | Status: DC | PRN

## 2014-12-22 NOTE — Progress Notes (Signed)
PFT performed today. 

## 2014-12-22 NOTE — Progress Notes (Signed)
MRN# [id number] Harrison G Abel 1945/12/29   CC: Chief Complaint  Patient presents with  . Shortness of Breath    PFT done today.      Brief History: HPI 10/31/14 - Ms. Reames presents today for hospital follow-up, along with her friend, who is HCPOA. See below for Hospital history. Patient was recently admitted for right carotid endarterectomy, postop she was noted to be hypoxic, mainly with ambulation descending down to 78%, and initially requiring 2 L of oxygen with exertion. She was also requiring 1 L of oxygen at night due to desats at sleep. Upon discharge, she needed 1 L of oxygen with exertion and sleep. CT during hospitalization showed bilateral basal atelectasis. In the office for 6 minute walk test showed no desaturations. Patient states overall she is doing well, and has a portable pulse oximetry monitor with her at times , that has shown no desaturation at rest or with exertion over the past 1 week.  She does have a history of smoking, quit in 1975, previously smoked 1-2 packs per day. Prior to hospitalization she had no cough, no wheeze, no sputum production, she has mild dyspnea on exertion, no shortness of breath at rest. PLAN - PFTs, ONO   Events since last clinic visit: She presents today for followup visit. She is accompanied by her health care power of attorney (best friend). Patient states that she is doing well, she is back to her baseline level of activities which includes gardening and being out in the yard. She denies any dyspnea on exertion, cough, wheeze, shortness of breath at rest, fever, night sweats, sudden weight loss. At her last visit given her significant smoking history and postop hypoxia a PFT and overnight pulse ox study was ordered. Today patient would like the results of her ordered studies from her last visit. Overall she states she is doing well.      PMHX:   Past Medical History  Diagnosis Date  . Heart disease   . Hypertension   .  Bipolar disorder   . 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 GERD (gastroesophageal reflux disease)   . Malignant neoplasm of thyroid gland January 23, 2011    medullary carcinoma thyroid T2, Nx.   Surgical Hx:  Past Surgical History  Procedure Laterality Date  . Hernia repair  2007  . Coronary stent placement  2007  . Cholecystectomy  2006  . Thyroidectomy  2012  . Colonoscopy  2008  . Nasal sinus surgery  2008   Family Hx:  No family history on file. Social Hx:   History  Substance Use Topics  . Smoking status: Former Smoker -- 1.00 packs/day for 10 years  . Smokeless tobacco: Never Used  . Alcohol Use: No   Medication:   Current Outpatient Rx  Name  Route  Sig  Dispense  Refill  . aspirin 81 MG tablet   Oral   Take 81 mg by mouth daily.         . DELZICOL 400 MG CPDR DR capsule   Oral   Take 400 mg by mouth 3 (three) times daily.          Marland Kitchen escitalopram (LEXAPRO) 10 MG tablet   Oral   Take 10 mg by mouth daily.         . isosorbide mononitrate (IMDUR) 30 MG 24 hr tablet   Oral   Take 1 tablet by mouth daily.         Marland Kitchen  levothyroxine (SYNTHROID, LEVOTHROID) 75 MCG tablet      TAKE ONE TABLET BY MOUTH EVERY DAY   30 tablet   6   . losartan (COZAAR) 50 MG tablet   Oral   Take 1 tablet by mouth daily.         . Multiple Vitamins-Minerals (MULTIVITAMIN WITH MINERALS) tablet   Oral   Take 1 tablet by mouth daily.         . nitroGLYCERIN (NITROSTAT) 0.4 MG SL tablet   Sublingual   Place 0.4 mg under the tongue as needed.         . pantoprazole (PROTONIX) 40 MG tablet   Oral   Take 1 tablet by mouth daily.         . simvastatin (ZOCOR) 40 MG tablet   Oral   Take 1 tablet by mouth daily.         Marland Kitchen albuterol (PROVENTIL HFA;VENTOLIN HFA) 108 (90 BASE) MCG/ACT inhaler   Inhalation   Inhale 2 puffs into the lungs every 4 (four) hours as needed for wheezing or shortness of breath.   1 Inhaler    5      Review of Systems: Gen:  Denies  fever, sweats, chills HEENT: Denies blurred vision, double vision, ear pain, eye pain, hearing loss, nose bleeds, sore throat Cvc:  No dizziness, chest pain or heaviness Resp:   Denies cough or sputum porduction, shortness of breath Gi: Denies swallowing difficulty, stomach pain, nausea or vomiting, diarrhea, constipation, bowel incontinence Gu:  Denies bladder incontinence, burning urine Ext:   No Joint pain, stiffness or swelling Skin: No skin rash, easy bruising or bleeding or hives Endoc:  No polyuria, polydipsia , polyphagia or weight change Psych: No depression, insomnia or hallucinations  Other:  All other systems negative  Allergies:  Review of patient's allergies indicates no known allergies.  Physical Examination:  VS: BP 150/52 mmHg  Pulse 76  Ht 5' 3"  (1.6 m)  Wt 140 lb (63.504 kg)  BMI 24.81 kg/m2  SpO2 98%  General Appearance: No distress  Neuro: EXAM: without focal findings, mental status, speech normal, alert and oriented, cranial nerves 2-12 grossly normal  HEENT: PERRLA, EOM intact, no ptosis, no other lesions noticed Pulmonary:Exam: normal breath sounds., diaphragmatic excursion normal.No wheezing, No rales   Cardiovascular:@ Exam:  Normal S1,S2.  No m/r/g.     Abdomen:Exam: Benign, Soft, non-tender, No masses  Skin:   warm, no rashes, no ecchymosis  Extremities: normal, no cyanosis, clubbing, no edema, warm with normal capillary refill.   Labs results:  BMP Lab Results  Component Value Date   NA 141 07/01/2008   K 4.1 07/01/2008   CL 107 07/01/2008   CO2 23 07/01/2008   GLUCOSE 103* 07/01/2008   BUN 12 07/01/2008   CREATININE 0.74 07/01/2008     CBC CBC Latest Ref Rng 07/01/2008  WBC 4.0-10.5 10*3/microliter 4.1  Hemoglobin 12.0-15.0 g/dL 11.5(L)  Hematocrit 36.0-46.0 % 34.6(L)  Platelets 150-400 K/uL 135(L)    PFT at this time by ATS criteria, is evident for:  No significant obstructive or restrictive  process, patient has a significant response to bronchodilators.  Moderate reduction in DLCO   Pre-Bronch        Post-Bronch     FEV1: Actual 2.02    Predicted 93%   Actual 2.42 Pred 112%  19%Chg  FEV1/FVC: Actual 70    Predicted 92%   Actual 74 Pred 97%  5%Chg  RV: Actual 2.63  Predicted 122%     TLC: Actual 6.06    Predicted 123%     RV/TLC ([place])(%): Actual 43  Predicted 98%    DLCO2:cor: Actual 13.55   Predicted 59%    Assessment and Plan: SOB (shortness of breath) on exertion Multifactorial: Postoperative atelectasis, history of smoking, deconditioning, possible COPD  Patient is now back to baseline, she is able to perform her daily activities without any significant shortness of breath. A pulmonary function testing showed a very mild reduction in FEV1/FVC, however she did have a significant bronchodilator response.  Plan: - Patient does have a previous significant smoking history, may have some mild obstructive disease, but her symptomatic level is essentially nonexistent, this is also confirmed her pulmonary function testing. Given her significant bronchodilator response, albuterol be ordered as a rescue inhaler only. Patient was educated on the use, demonstration, technique of albuterol (1-2 puffs every 4 hours when necessary shortness of breath/wheezing, coughing spell). - Overnight pulse oximetry performed at last visit -no significant desaturation events requiring oxygen was noted. She does not need and does not qualify for overnight supplemental oxygen.     Updated Medication List Outpatient Encounter Prescriptions as of 12/22/2014  Medication Sig  . aspirin 81 MG tablet Take 81 mg by mouth daily.  . DELZICOL 400 MG CPDR DR capsule Take 400 mg by mouth 3 (three) times daily.   Marland Kitchen escitalopram (LEXAPRO) 10 MG tablet Take 10 mg by mouth daily.  . isosorbide mononitrate (IMDUR) 30 MG 24 hr tablet Take 1 tablet by mouth daily.  Marland Kitchen levothyroxine (SYNTHROID, LEVOTHROID) 75 MCG  tablet TAKE ONE TABLET BY MOUTH EVERY DAY  . losartan (COZAAR) 50 MG tablet Take 1 tablet by mouth daily.  . Multiple Vitamins-Minerals (MULTIVITAMIN WITH MINERALS) tablet Take 1 tablet by mouth daily.  . nitroGLYCERIN (NITROSTAT) 0.4 MG SL tablet Place 0.4 mg under the tongue as needed.  . pantoprazole (PROTONIX) 40 MG tablet Take 1 tablet by mouth daily.  . simvastatin (ZOCOR) 40 MG tablet Take 1 tablet by mouth daily.  Marland Kitchen albuterol (PROVENTIL HFA;VENTOLIN HFA) 108 (90 BASE) MCG/ACT inhaler Inhale 2 puffs into the lungs every 4 (four) hours as needed for wheezing or shortness of breath.  . [DISCONTINUED] Influenza Vac Split High-Dose (FLUZONE HIGH-DOSE) 0.5 ML SUSY Inject 0.5 mLs into the muscle once.    Orders for this visit: No orders of the defined types were placed in this encounter.    Thank  you for the visitation and for allowing  [facility] to assist in the care of your patient. Our recommendations are noted above.  Please contact us if we can be of further service.  Vilinda Boehringer, MD [facility] Office Number: [phone]

## 2014-12-22 NOTE — Assessment & Plan Note (Signed)
Multifactorial: Postoperative atelectasis, history of smoking, deconditioning, possible COPD  Patient is now back to baseline, she is able to perform her daily activities without any significant shortness of breath. A pulmonary function testing showed a very mild reduction in FEV1/FVC, however she did have a significant bronchodilator response.  Plan: - Patient does have a previous significant smoking history, may have some mild obstructive disease, but her symptomatic level is essentially nonexistent, this is also confirmed her pulmonary function testing. Given her significant bronchodilator response, albuterol be ordered as a rescue inhaler only. Patient was educated on the use, demonstration, technique of albuterol (1-2 puffs every 4 hours when necessary shortness of breath/wheezing, coughing spell). - Overnight pulse oximetry performed at last visit -no significant desaturation events requiring oxygen was noted. She does not need and does not qualify for overnight supplemental oxygen.

## 2014-12-22 NOTE — Patient Instructions (Signed)
Follow up with Dr. Stevenson Clinch in 6 months.

## 2015-01-11 ENCOUNTER — Encounter: Payer: Self-pay | Admitting: Internal Medicine

## 2015-01-24 ENCOUNTER — Ambulatory Visit: Payer: Self-pay | Admitting: General Surgery

## 2015-01-24 DIAGNOSIS — Z85850 Personal history of malignant neoplasm of thyroid: Secondary | ICD-10-CM | POA: Diagnosis not present

## 2015-01-25 LAB — CALCITONIN: Calcitonin: 26.9 pg/mL — ABNORMAL HIGH (ref 0.0–5.0)

## 2015-01-25 LAB — CEA: CEA: 4.7 ng/mL (ref 0.0–4.7)

## 2015-01-25 LAB — T4 AND TSH
T4, Total: 10.9 ug/dL (ref 4.5–12.0)
TSH: 7.53 u[IU]/mL — ABNORMAL HIGH (ref 0.450–4.500)

## 2015-01-30 ENCOUNTER — Ambulatory Visit (INDEPENDENT_AMBULATORY_CARE_PROVIDER_SITE_OTHER): Payer: Medicare Other | Admitting: General Surgery

## 2015-01-30 ENCOUNTER — Encounter: Payer: Self-pay | Admitting: General Surgery

## 2015-01-30 ENCOUNTER — Other Ambulatory Visit: Payer: Medicare Other

## 2015-01-30 VITALS — BP 110/58 | HR 68 | Resp 14 | Ht 63.0 in | Wt 141.0 lb

## 2015-01-30 DIAGNOSIS — Z85850 Personal history of malignant neoplasm of thyroid: Secondary | ICD-10-CM | POA: Diagnosis not present

## 2015-01-30 NOTE — Patient Instructions (Addendum)
Patient to return in six months with labs.  Calcitonin and CEA

## 2015-01-30 NOTE — Progress Notes (Signed)
Patient ID: Margaret Romero, female   DOB: Apr 14, 1946, 69 y.o.   MRN: [id number]  Chief Complaint  Patient presents with  . Follow-up    Thyroid Cancer    HPI Margaret Romero is a 69 y.o. female who presents for a 1 year follow up thyroid cancer evaluation. The patient is doing well. No new complaints at this time. Since her last visit she did undergo a right carotid endarterectomy. Apparently there was a more extensive dissection required and she has had some issues with memory since the event.  HPI  Past Medical History  Diagnosis Date  . Heart disease   . Hypertension   . Bipolar disorder   . 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 GERD (gastroesophageal reflux disease)   . Malignant neoplasm of thyroid gland January 23, 2011    medullary carcinoma thyroid T2, Nx.    Past Surgical History  Procedure Laterality Date  . Hernia repair  2007  . Coronary stent placement  2007  . Cholecystectomy  2006  . Thyroidectomy  2012  . Colonoscopy  2008  . Nasal sinus surgery  2008  . Carotid endarterectomy Right November 2015    [name], MD    History reviewed. No pertinent family history.  Social History History  Substance Use Topics  . Smoking status: Former Smoker -- 1.00 packs/day for 10 years  . Smokeless tobacco: Never Used  . Alcohol Use: No    No Known Allergies  Current Outpatient Prescriptions  Medication Sig Dispense Refill  . albuterol (PROVENTIL HFA;VENTOLIN HFA) 108 (90 BASE) MCG/ACT inhaler Inhale 2 puffs into the lungs every 4 (four) hours as needed for wheezing or shortness of breath. 1 Inhaler 5  . aspirin 81 MG tablet Take 81 mg by mouth daily.    . DELZICOL 400 MG CPDR DR capsule Take 400 mg by mouth 3 (three) times daily.     Marland Kitchen escitalopram (LEXAPRO) 10 MG tablet Take 10 mg by mouth daily.    . isosorbide mononitrate (IMDUR) 30 MG 24 hr tablet Take 1 tablet by mouth daily.    Marland Kitchen levothyroxine (SYNTHROID,  LEVOTHROID) 75 MCG tablet TAKE ONE TABLET BY MOUTH EVERY DAY 30 tablet 6  . losartan (COZAAR) 50 MG tablet Take 1 tablet by mouth daily.    . Multiple Vitamins-Minerals (MULTIVITAMIN WITH MINERALS) tablet Take 1 tablet by mouth daily.    . pantoprazole (PROTONIX) 40 MG tablet Take 1 tablet by mouth daily.    . simvastatin (ZOCOR) 40 MG tablet Take 1 tablet by mouth daily.     No current facility-administered medications for this visit.    Review of Systems Review of Systems  Constitutional: Negative.   Respiratory: Negative.   Cardiovascular: Negative.     Blood pressure 110/58, pulse 68, resp. rate 14, height 5' 3"  (1.6 m), weight 141 lb (63.957 kg).  Physical Exam Physical Exam  Constitutional: She is oriented to person, place, and time. She appears well-developed and well-nourished.  Eyes: Conjunctivae are normal. No scleral icterus.  Cardiovascular: Normal rate, regular rhythm and normal heart sounds.   Pulmonary/Chest: Effort normal and breath sounds normal.  Neurological: She is alert and oriented to person, place, and time.  Skin: Skin is warm and dry.    Data Reviewed Serum calcitonin has increased from less than 5 to greater than 26. Mild elevation of CEA. TSH is improved at 7.4. T4 level remains within normal values.  Ultrasound  examination of the neck showed no evidence of recurrent disease. No adenopathy or evidence of residual thyroidal tissue. No images.  Assessment    Rising serum calcitonin now 4 years out from total thyroidectomy for medullary carcinoma.    Plan    Review of the literature as suggested a benefit from blind neck dissection. For values of calcitonin less than 150, observation is reasonable.  The patient's case will be presented at the [facility] tumor board in the near future.  Patient to return in six months with labs. Calcitonin and CEA    PCP:  Etheleen Mayhew 01/31/2015, 8:38 PM

## 2015-01-31 ENCOUNTER — Encounter: Payer: Self-pay | Admitting: General Surgery

## 2015-02-02 ENCOUNTER — Telehealth: Payer: Self-pay | Admitting: *Deleted

## 2015-02-02 ENCOUNTER — Emergency Department: Payer: Self-pay | Admitting: Emergency Medicine

## 2015-02-02 DIAGNOSIS — R58 Hemorrhage, not elsewhere classified: Secondary | ICD-10-CM | POA: Diagnosis not present

## 2015-02-02 DIAGNOSIS — S0190XA Unspecified open wound of unspecified part of head, initial encounter: Secondary | ICD-10-CM | POA: Diagnosis not present

## 2015-02-02 DIAGNOSIS — S0101XA Laceration without foreign body of scalp, initial encounter: Secondary | ICD-10-CM | POA: Diagnosis not present

## 2015-02-02 DIAGNOSIS — W19XXXA Unspecified fall, initial encounter: Secondary | ICD-10-CM | POA: Diagnosis not present

## 2015-02-02 DIAGNOSIS — I10 Essential (primary) hypertension: Secondary | ICD-10-CM | POA: Diagnosis not present

## 2015-02-02 DIAGNOSIS — S0181XA Laceration without foreign body of other part of head, initial encounter: Secondary | ICD-10-CM | POA: Diagnosis not present

## 2015-02-02 NOTE — Telephone Encounter (Signed)
-----   Message from [name], MD sent at 02/02/2015  3:56 PM EST ----- Please notify the patient that our afternoon tumor conference suggested that a CT scan be obtained to look for any possible hiding places of cancer. The patient needs a CT of the neck, chest, abdomen and pelvis with contrast. Reynaldo Minium

## 2015-02-02 NOTE — Telephone Encounter (Signed)
Message left for patient to call the office.

## 2015-02-03 NOTE — Telephone Encounter (Signed)
Spoke with patient's caregiver [name] and she wanted to let you know that the patient was seen in the ER yesterday due to hitting her head on a tree branch. She had a CT of her head done at the hospital. She wants you to review this and then let her know if you still want to have a CT done of Margaret Romero's head, neck, abdomen, and pelvis based on what you find.

## 2015-02-06 NOTE — Telephone Encounter (Signed)
Please notify the patient/caregiver that the CT of the head was reviewed. We do still need to proceed with examination of the neck/chest/abdomen and pelvis.

## 2015-02-07 NOTE — Telephone Encounter (Signed)
Spoke with [name] patent's care giver. Patient is scheduled for a CT of the neck, chest, abdomen, and pelvis with contrast at [facility] location on 02/14/15 at 3:30 pm. She is to pick up a prep kit for this. She is to arrive by 3:15 pm with a list of her medications. She is to have no solid foods 4 hours prior but may have clear liquids up until test time. Patient is aware of date, time, and instructions.

## 2015-02-11 ENCOUNTER — Emergency Department: Payer: Self-pay | Admitting: Emergency Medicine

## 2015-02-11 DIAGNOSIS — F419 Anxiety disorder, unspecified: Secondary | ICD-10-CM | POA: Diagnosis not present

## 2015-02-11 DIAGNOSIS — Z4802 Encounter for removal of sutures: Secondary | ICD-10-CM | POA: Diagnosis not present

## 2015-02-11 DIAGNOSIS — I10 Essential (primary) hypertension: Secondary | ICD-10-CM | POA: Diagnosis not present

## 2015-02-11 DIAGNOSIS — F329 Major depressive disorder, single episode, unspecified: Secondary | ICD-10-CM | POA: Diagnosis not present

## 2015-02-11 DIAGNOSIS — L089 Local infection of the skin and subcutaneous tissue, unspecified: Secondary | ICD-10-CM | POA: Diagnosis not present

## 2015-02-14 ENCOUNTER — Ambulatory Visit: Payer: Self-pay | Admitting: General Surgery

## 2015-02-14 DIAGNOSIS — J432 Centrilobular emphysema: Secondary | ICD-10-CM | POA: Diagnosis not present

## 2015-02-14 DIAGNOSIS — Z09 Encounter for follow-up examination after completed treatment for conditions other than malignant neoplasm: Secondary | ICD-10-CM | POA: Diagnosis not present

## 2015-02-14 DIAGNOSIS — R161 Splenomegaly, not elsewhere classified: Secondary | ICD-10-CM | POA: Diagnosis not present

## 2015-02-14 DIAGNOSIS — K7460 Unspecified cirrhosis of liver: Secondary | ICD-10-CM | POA: Diagnosis not present

## 2015-02-14 DIAGNOSIS — C73 Malignant neoplasm of thyroid gland: Secondary | ICD-10-CM | POA: Diagnosis not present

## 2015-02-14 DIAGNOSIS — K766 Portal hypertension: Secondary | ICD-10-CM | POA: Diagnosis not present

## 2015-02-14 DIAGNOSIS — I2510 Atherosclerotic heart disease of native coronary artery without angina pectoris: Secondary | ICD-10-CM | POA: Diagnosis not present

## 2015-02-14 DIAGNOSIS — Z85850 Personal history of malignant neoplasm of thyroid: Secondary | ICD-10-CM | POA: Diagnosis not present

## 2015-02-15 ENCOUNTER — Encounter: Payer: Self-pay | Admitting: General Surgery

## 2015-02-17 ENCOUNTER — Telehealth: Payer: Self-pay

## 2015-02-17 ENCOUNTER — Telehealth: Payer: Self-pay | Admitting: General Surgery

## 2015-02-17 NOTE — Telephone Encounter (Signed)
The CT results to evaluate for the elevated serum calcitonin level were discussed with the patient's daughter, Fabienne Bruns. (Family contact requested because of the patient's mental status changes post carotid endarterectomy) sees.  The CT scan did not show evidence of recurrent disease in the neck or chest. There were however multiple lesions in the spleen which were not present on a 2006 CT when she was evaluated for acute cholecystitis. An MRI of the spleen is been recommended.  I've spoken to Dr. Posey Pronto in radiology and reviewed this with him and a MRI will be scheduled.  We will contact the patient's daughter to confirm the time and move forward after the scan is complete.

## 2015-02-17 NOTE — Telephone Encounter (Signed)
Message from [name], MD sent at 02/17/2015 10:30 AM EDT -----    Please arrange for an MRI of the spleen: Evaluate multiple filling defects on recent CT. Please contact the patient's daughter, Molli Hazard, for transportation and time. Thank you   Spoke with patient's daughter Molli Hazard to discuss setting up an MRI for her mother. Patient is scheduled for a MRI at [facility] on 03/01/15 at 12:30 pm. She is to arrive there by 12:00 pm. Their is no prep for this procedure. She is aware of date, time, and instructions.

## 2015-02-27 DIAGNOSIS — I10 Essential (primary) hypertension: Secondary | ICD-10-CM | POA: Diagnosis not present

## 2015-02-27 DIAGNOSIS — E785 Hyperlipidemia, unspecified: Secondary | ICD-10-CM | POA: Diagnosis not present

## 2015-02-27 DIAGNOSIS — I6529 Occlusion and stenosis of unspecified carotid artery: Secondary | ICD-10-CM | POA: Diagnosis not present

## 2015-02-27 DIAGNOSIS — J449 Chronic obstructive pulmonary disease, unspecified: Secondary | ICD-10-CM | POA: Diagnosis not present

## 2015-03-01 ENCOUNTER — Ambulatory Visit
Admit: 2015-03-01 | Disposition: A | Discharge status: Home / Self Care | Payer: Self-pay | Attending: General Surgery | Admitting: General Surgery

## 2015-03-01 DIAGNOSIS — R1012 Left upper quadrant pain: Secondary | ICD-10-CM | POA: Diagnosis not present

## 2015-03-01 DIAGNOSIS — K7460 Unspecified cirrhosis of liver: Secondary | ICD-10-CM | POA: Diagnosis not present

## 2015-03-01 DIAGNOSIS — K766 Portal hypertension: Secondary | ICD-10-CM | POA: Diagnosis not present

## 2015-03-01 DIAGNOSIS — R161 Splenomegaly, not elsewhere classified: Secondary | ICD-10-CM | POA: Diagnosis not present

## 2015-03-03 ENCOUNTER — Encounter: Payer: Self-pay | Admitting: General Surgery

## 2015-03-22 DIAGNOSIS — I2510 Atherosclerotic heart disease of native coronary artery without angina pectoris: Secondary | ICD-10-CM | POA: Diagnosis not present

## 2015-03-22 DIAGNOSIS — I6521 Occlusion and stenosis of right carotid artery: Secondary | ICD-10-CM | POA: Diagnosis not present

## 2015-03-22 DIAGNOSIS — R0602 Shortness of breath: Secondary | ICD-10-CM | POA: Diagnosis not present

## 2015-03-22 DIAGNOSIS — I10 Essential (primary) hypertension: Secondary | ICD-10-CM | POA: Diagnosis not present

## 2015-03-24 NOTE — Consult Note (Signed)
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    HISTORY OF PRESENT ILLNESS: A 69 year old female with history of chronic diarrhea and suspected ulcerative colitis. The patient has been on Asacol for about 10 years or so, but about a year ago, her Asacol was changed to be Lialda due to insurance reasons. The patient was admitted with some dizziness as well as some increased diarrhea. She was found to have pyelonephritis and was admitted for further management of urinary tract infection. The patient's cell counts started to go down, with leukopenia, anemia and thrombocytopenia. Hematology was consulted, and there is a possibility that [place] may be contributing to pancytopenia. The patient was evaluated on the request of Dr. Laurin Coder yesterday. Her diarrhea is better. She denies any other specific GI symptoms.   PAST MEDICAL HISTORY: Significant for history of questionable ulcerative colitis. The patient has been on 5-ASA preparations for several years. History of hypertension, hyperlipidemia, hypothyroidism, GERD, history of thyroid cancer, carotid artery stenosis and depression.   ALLERGIES: None.   SOCIAL HISTORY: She does not smoke or drink.   FAMILY HISTORY: Unremarkable.   MEDICINES AT HOME: Include: 1. Aspirin 81 mg a day. 2. Cozaar 50 mg a day.  3. Fluoxetine 20 mg a day. 4. Imdur 60 mg a day. 5. Synthroid 75 mcg daily. 6. Lialda 1.2 gram tablets 2 tablets a day. 7. Protonix 40 mg a day. 8. Zocor 40 mg a day. 9. Zofran p.r.n.   PHYSICAL EXAMINATION:  GENERAL: Fairly well-built female, does not appear to be in any acute distress, quite awake and alert. She appears somewhat pale. No jaundice was noticed.  LUNGS:  Clear. CARDIOVASCULAR: Regular rate and rhythm, a 2/6 systolic murmur.  ABDOMEN: Soft and benign, nontender, nondistended. No rebound or guarding was noted.  NEUROLOGIC: Appears to be unremarkable.  VITAL SIGNS: Temperature 98.7, pulse 68, respirations 18, blood pressure 162/60   LABORATORIES: Her white cell count was about 3.4, which dropped down slowly to 1.7 and is now 3. Hemoglobin was 11.7, that dropped down to 10.6, but now is 11.2. Platelet count was 95 on admission, that dropped down to 58 and is now 69. Electrolytes are fairly unremarkable as well as BUN and creatinine. Ultrasound of the abdomen did not show splenomegaly or evidence of cirrhosis of liver.   ASSESSMENT AND PLAN: The patient with pancytopenia, with further drop in her cell counts. Lialda was suspected by the hematologist. The patient has done very well on Asacol in the past, and I would recommend switching her back to Asacol. The case was discussed with the pharmacist. Asacol is not available, but Delzicol, which is exactly the same preparation as Asacol, is available. The patient has been started on Delzicol 800 mg 3 times a day. Discontinue Lialda. The patient's cell counts seem to be improving, and they may very well be secondary to bone marrow suppression secondary to urinary tract infection. The patient's diarrhea seems to be under control as well. Will follow.   ____________________________ Jill Side, MD si:OSi D: 03/06/2013 08:53:54 ET T: 03/06/2013 10:58:17 ET JOB#: [id number]  cc: Jill Side, MD, <Dictator> Jill Side MD ELECTRONICALLY SIGNED 03/07/2013 17:27

## 2015-03-24 NOTE — Consult Note (Signed)
Chief Complaint:  Subjective/Chief Complaint No new complaints. Cell counts improving. Continue Delzicol and follow CBC.   Electronic Signatures: Jill Side (MD)  (Signed 05-Apr-14 08:54)  Authored: Chief Complaint   Last Updated: 05-Apr-14 08:54 by Jill Side (MD)

## 2015-03-24 NOTE — Consult Note (Signed)
History of Present Illness:  Reason for Consult Pancytopenia   Date of Diagnosis 03-Mar-2013   HPI   This is a 69 year old female who comes into the hospital due to persistent dizziness and diarrhea. On 03/02/13 In the Emergency Room, the patient was noted to be orthostatic and also noted to have a positive urinalysis with some flank pain. She was clinically diagnosed with possibly acute pyelonephritis with acute orthostatic hypotension.was found to have progressive decrease in Wcc,Hb and platelet count and we are consulted for panyctopenia/  PFSH:  Family History positive, mum,dad with heart disease   Social History negative alcohol, negative tobacco   Additional Past Medical and Surgical History possible ulcerative colitis   Review of Systems:  General denies complaints   Performance Status (ECOG) 2   HEENT no complaints   Lungs no complaints   Cardiac no complaints   GI no complaints   GU no complaints   Musculoskeletal no complaints   Extremities no complaints   Skin no complaints   Neuro no complaints   Endocrine no complaints   Psych no complaints   NURSING NOTES: ED Vital Sign Flow Sheet:   01-Apr-14 22:22   Pulse Pulse: 74   Respirations Respirations: 18   DBP DBP: 60   Pulse Ox % Pulse Ox %: 98   Pulse Ox Source Source: Room Air   Pain Scale (0-10) Pain Scale (0-10): Scale:0  NURSING NOTES: **Vital Signs.:   03-Apr-14 14:15   Vital Signs Type: Routine   Temperature Temperature (F): 98.2   Celsius: 36.7   Temperature Source: oral   Pulse Pulse: 67   Respirations Respirations: 18   Systolic BP Systolic BP: 432   Diastolic BP (mmHg) Diastolic BP (mmHg): 47   Mean BP: 76   Pulse Ox % Pulse Ox %: 96   Pulse Ox Activity Level: At rest   Oxygen Delivery: Room Air/ 21 %   Physical Exam:  HEENT: normal   Lungs: clear   Cardiac: regular rate, rhythm   Breast: not examined   Abdomen: soft  nontender  positive bowel sounds    Skin: intact   Extremities: No edema, rash or cyanosis   Neuro: AAOx3   Psych: normal appearance    No Known Allergies:   Laboratory Results:  Routine Chem:  01-Apr-14 17:50   Calcium (Total), Serum  8.3   Assessment and Plan: Impression:   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  Plan:   Will continue to hold heparin.B12,Folate and TSH levels.Also check retic count.hold Lialda as can cause aplastic anemia.also hold ASA and Protonix as these may contribute to low platelet count.need to switch antibiotics if counts continue to trend down.bone marrow aspirate/biopsy at this time as unlikely marrow issue with counts normal a few days ago.follow.  Electronic Signatures: Georges Mouse (MD)  (Signed 03-Apr-14 16:36)  Authored: HISTORY OF PRESENT ILLNESS, PFSH, ROS, NURSING NOTES, PE, ALLERGIES, HOME MEDICATIONS, LABS, ASSESSMENT AND PLAN   Last Updated: 03-Apr-14 16:36 by Georges Mouse (MD)

## 2015-03-24 NOTE — H&P (Signed)
PATIENT NAME:  Margaret Romero, Margaret Romero MR#:  [id number] DATE OF BIRTH:  [birth year]  DATE OF ADMISSION:  03/02/2013  PRIMARY CARE PHYSICIAN: Dr. Margarita Rana.   CHIEF COMPLAINT: Dizziness and diarrhea.   HISTORY OF PRESENT ILLNESS: This is a 69 year old female who comes into the hospital due to persistent dizziness and diarrhea. She presented to the hospital about 2 to 3 days ago with similar symptoms, but was discharged home with no etiology of her symptoms known. She went home. She continued to have some dizziness. She was given some Tylenol with codeine for some pain, although that made her more nausea as she was not able to keep anything down. She was feeling more increasingly dizzy and then this morning, started developing diarrhea and she had about 5 loose stools this morning. She therefore came to the ER for further evaluation. In the Emergency Room, the patient was noted to be orthostatic and also noted to have a positive urinalysis with some flank pain. She was clinically diagnosed with possibly acute pyelonephritis with acute orthostatic hypotension. Hospitalist services were contacted for further treatment and evaluation. The patient presently still complains of dizziness, but no nausea, no vomiting, no abdominal pain. No fevers, no chills and no other associated symptoms presently.   REVIEW OF SYSTEMS:  CONSTITUTIONAL: No documented fever. No weight gain or weight loss.  EYES: No blurred or double vision.  ENT: No tinnitus, no postnasal drip. No redness of the oropharynx.  RESPIRATORY: No cough, no wheeze. No hemoptysis.  CARDIOVASCULAR: No chest pain, no orthopnea, no palpitations, no syncope.  GASTROINTESTINAL: Positive nausea, positive vomiting, positive diarrhea. No abdominal pain, no melena or hematochezia.  GENITOURINARY: No dysuria or hematuria.  ENDOCRINE: No polyuria or nocturia. No heat or cold intolerance.   HEMATOLOGIC: No anemia, no bruising, no bleeding.  INTEGUMENTARY: No rashes  or lesions.  MUSCULOSKELETAL: No arthritis, no swelling, no gout.  NEUROLOGIC: No numbness or tingling. No ataxia. No seizure-type activity.  PSYCHIATRIC: No anxiety, no insomnia, no ADD.   PAST MEDICAL HISTORY: Consistent with hypertension, hyperlipidemia, hypothyroidism, GERD, history of thyroid cancer, history of carotid artery stenosis, depression. Suspected ulcerative colitis.    ALLERGIES: No known drug allergies.   SOCIAL HISTORY: No smoking. No alcohol abuse. No illicit drug abuse. Lives at home by herself.   FAMILY HISTORY: Both mother and father died from complications of heart disease.   CURRENT MEDICATIONS: Aspirin 81 mg daily, Cozaar 50 mg daily, fluoxetine 20 mg daily Imdur 60 mg daily, Synthroid 75 mcg daily, Lialda 1.2 mg 2 tabs daily, multivitamin daily, Protonix 40 mg daily, Zocor 40 mg daily and Zofran 4 mg t.i.d. as needed.  PHYSICAL EXAMINATION: On admission:  VITAL SIGNS: Temperature 98.1, pulse 78, respirations 20, blood pressure 165/77, sats 98% on room air.  GENERAL: She is a pleasant appearing female in no apparent distress.  HEENT: Atraumatic, normocephalic. Her extraocular muscles are intact. The pupils are equal and reactive to light. Sclerae is anicteric. No conjunctival injection. No pharyngeal erythema.  NECK: Supple. No jugular venous distention. No bruits, no lymphadenopathy or thyromegaly.  HEART: Regular rate and rhythm. No murmurs, rubs or clicks.  LUNGS: Clear to auscultation bilaterally. No rales, no rhonchi, no wheezes.  ABDOMEN: Soft, flat, tender in the flank area. No rebound, no rigidity. No hyperactive bowel sounds. No hepatosplenomegaly appreciated.  EXTREMITIES: No evidence of any cyanosis, clubbing or peripheral edema. Has +2 pedal and radial pulses bilaterally.  NEUROLOGIC: The patient is alert, awake and oriented x 3  with no focal motor or sensory deficits appreciated bilaterally.  SKIN: Moist and warm with no rash appreciated.  LYMPHATIC:  There is no cervical or axillary lymphadenopathy.   LABORATORY, DIAGNOSTIC, AND RADIOLOGICAL DATA: Serum glucose 97, BUN 14, creatinine 0.9, sodium 138, potassium 4.1, chloride 105, bicarbonate 27. LFTs are within normal limits. Troponin less than 0.02. White cell count is 3.4, hemoglobin 11.7, hematocrit 34.8, platelet count 95. Urinalysis shows 3+ leukocyte esterase, 20 white cells.   ASSESSMENT AND PLAN: This is a 69 year old female with a history of hypertension, hypothyroidism, depression, and ulcerative colitis, gastroesophageal reflux disease who presents to the hospital due to dizziness and diarrhea, also noted to be orthostatic and noted to have a urinary tract infection with suspected acute pyelonephritis.  1.  Acute pyelonephritis. This is likely the cause of the patient's orthostasis and dizziness. The patient has clinically left flank pain and a positive urinalysis. We will go ahead and aggressively hydrate her with IV fluids, supportive care with antiemetics, pain control. We will also give her IV ceftriaxone, follow urine cultures.  2.  Orthostatic hypotension. This is likely secondary to the pyelonephritis and dehydration from her diarrhea and poor oral intake. I will give her aggressive IV fluids and repeat orthostatics in the morning. Hold her Cozaar and Imdur for now.  3.  Hypothyroidism. Continue Synthroid.  4.  Depression. Continue fluoxetine.  5.  Gastroesophageal reflux disease. Continue Protonix.  6. Colitis. Continue with the mesalamine. She is currently having some diarrhea. I will check stool for Clostridium difficile and comprehensive culture.  7.  Thrombocytopenia the etiology is unclear, possibly related to the pyelo and the acute infection. We will repeat her platelet count tomorrow. If it continues to be low, consider getting a hematology consult.   CODE STATUS: The patient is a full code.   TIME SPENT WITH ADMISSION: 50 minutes    ____________________________ Belia Heman. Verdell Carmine, MD vjs:cc D: 03/02/2013 22:33:10 ET T: 03/02/2013 23:15:31 ET JOB#: [id number]  cc: Belia Heman. Verdell Carmine, MD, <Dictator> Henreitta Leber MD ELECTRONICALLY SIGNED 03/03/2013 21:19

## 2015-03-24 NOTE — Consult Note (Addendum)
Brief Consult Note: Diagnosis: Pancytopenia, ? secondary to Lialda.   Patient was seen by consultant.   Discussed with Attending MD.   Comments: Pancytopenia ina patient with UTI. Doubt that the pancytopenia is due to Lialda although possible. patieny has done well on Asacol in the past. UC is well controlled.  Recommendations: DC Lialda. Start Delzicol 800 TID. Follow cell counts. Will follow..  Electronic Signatures: Jill Side (MD)  (Signed 04-Apr-14 17:06)  Authored: Brief Consult Note   Last Updated: 04-Apr-14 17:06 by Jill Side (MD)

## 2015-03-24 NOTE — Discharge Summary (Signed)
PATIENT NAME:  Margaret Romero, Margaret Romero MR#:  [id number] DATE OF BIRTH:  [birth year]  DATE OF ADMISSION:  03/02/2013 DATE OF DISCHARGE:  03/06/2013  ADMITTING DIAGNOSES:  1.  Acute pyelonephritis.  2.  Orthostatic hypotension.  3.  Hypothyroidism.  4.  Depression.  5.  Gastroesophageal reflux disease  6.  Ulcerative colitis.   DISCHARGE DIAGNOSES: 1.  Systemic inflammatory response reaction. 2.  Orthostatic hypotension.  3.  Dehydration.  4.  Diarrhea. 5.  Suspected acute  pyelonephritis; urine cultures were negative.  6.  Pancytopenia; suspected DRUG REACTION TO LIALDA, resolved off the [place].  7.  Cough, unclear etiology.  8.  History of ulcerative colitis.  9.  Hypothyroidism.  10.  Depression.  11.  Gastroesophageal reflux disease.  12.  As well as hypertension.   DISCHARGE CONDITION: Stable.   DISCHARGE MEDICATIONS: The patient is to resume:  1.  Zocor 40 mg p.o. at bedtime.  2.  Protonix 40 mg p.o. daily.  3. Aspirin 81 mg daily.  4.  Multivitamins once daily.  5.  Fluoxetine 20 mg p.o. daily.  6. Levothroid 75 mcg p.o. daily.  7.  Zofran ODT 4 mg 3 times daily as needed.  8.  Mesalamine;  the patient is being discharged on different mesalamine, which is going to be mesalamine DR, which is Delzicol 800 mg 3 times daily.  9.  THE PATIENT IS NOT TO RESTART LIALDA. 10.  Benzonatate 100 mg p.o. every 4 hours as needed.  11.  Codeine/guaifenesin/pseudoephedrine patches, use every 4 hours as needed.  12.  Keflex 500 mg p.o. 3 times daily for 3 more days.   DIET: Low salt, mechanical soft.   ACTIVITY LIMITATIONS: As tolerated.   FOLLOWUP APPOINTMENT: With Dr. Venia Minks in 2 days after discharge. Also have CBC done in 2 days after discharge.    CONSULTANTS: Care management, Dr. Dionne Milo, Gastroenterology, Dr. Kallie Edward, Oncology.   RADIOLOGIC STUDIES: Chest PA and lateral 03/05/2013 showed no acute cardiopulmonary disease.   Ultrasound of abdomen, limited survey, 03/02/2013  revealed no abnormality of the liver or visualized portions of pancreas. Gallbladder is surgically absent.   The patient is a 69 year old Caucasian female with a past medical history significant for history of ulcerative colitis who was recently changed from her usual doses of mesalamine/Asacol which she took for took for approximately 10 years to [place] due to insurance reasons. She was admitted with dizziness as well as increased diarrhea. She was also complaining of flank pain and was felt to have acute pyelonephritis. Please refer to Dr. Edward Jolly admission on 03/02/2013. On arrival to the Emergency Room, the patient's temperature was 98.1, pulse was 78, respiratory rate was 20, blood pressure 165/77, saturation was 98% on room air. Physical exam was unremarkable. The patient was noted to be orthostatic in the Emergency Room and she was felt to have pyelonephritis, so she was admitted to the hospital for further evaluation and Rocephin was initiated for suspected pyelonephritis. She was also given IV fluids, and with this therapy she improved. She was consulted by Dr. Dionne Milo who saw the patient in consultation on 03/05/2013, and he followed the patient on 03/06/2013. He felt that the patient's presentation, pancytopenia could have been related to mesalamine, LIALDA, however he was not really sure about that. He felt that the patient did well in the past on Asacol and he felt that the patient's ulcerative colitis, despite diarrhea, was well-controlled. He recommended to discontinue LIALDA and start  the patient on Delzicol at 800  mg 3 times daily dose. With this, patient was initiated on Delzicol and her white blood cell count improved. Initially on arrival to the hospital, the patient's BMP was unremarkable. Liver enzymes revealed elevated ALT to 107. The patient's white blood cell count was low at 3.4, but hemoglobin was 11.7. Platelet count was 95. With continued therapy with LIALDA, the patient's white  blood cell count decreased to 1.7 on 03/04/2013 and hemoglobin decreased to 10.6 the same day, and the patient's platelet count was 58 on 03/04/2013. On stopping LIALDA, the patient's white blood cell count rebounded, and by 03/06/2013 the patient's white blood cell count was 3.0, with absolute neutrophil count was being normal at 1.7. The patient's hemoglobin level also improved to 11.2, and platelet count  also improved to 69. The patient was also evaluated by Dr. Kallie Edward, cancer/oncology specialist, who recommended to continue to hold heparin, and she felt that the pancytopenia likely multifactorial; it could be secondary to medication. According to Dr. Kallie Edward, the patient's LIALDA could have caused aplastic anemia as well as agranulocytosis; however, with stopping the [place], the patient's CBC improved, and it was felt that she tolerated different medication/different mesalamine combination, DR capsule with Delzicol much better. It was  recommended to follow the patient's CBC as outpatient and make decisions about discontinuation of this medication in their gastroenterology consultation as outpatient, if needed.   Orthostatic hypotension was felt to be due to increased diarrhea as well possibly poor p.o. intake. The patient was rehydrated; and with this, the patient's blood pressure improved. Her diarrhea also subsided. She was treated for acute pyelonephritis. Her urine cultures were taken; however, urine cultures grew mixed bacterial organisms, which was suggestive of contamination. However, since the patient improved on Rocephin, it was felt that the patient is to continue antibiotics for 3 more days to complete a 7-day course.   The patient was complaining of some cough, and cough medications worked for her. It was unclear why she was having a cough, however no sputum production was noted. The patient had an x-ray of her chest done, which was unremarkable.   For her history of hyperthyroidism as well as  depression and gastroesophageal reflux disease, the patient is to continue her outpatient management.   The patient is being discharged in stable condition with the above-mentioned medications and followup.   The patient's vital signs on the day of discharge: Temperature 98, pulse was 71, respiratory rate was 18, patient's blood pressure was 137/57, saturation was 96% on room air at rest.   TIME SPENT: 40 minutes.   ____________________________ Theodoro Grist, MD rv:dm D: 03/06/2013 17:11:00 ET T: 03/07/2013 09:51:49 ET JOB#: [id number]  cc: Jerrell Belfast, MD Theodoro Grist, MD, <Dictator>   [place] MD ELECTRONICALLY SIGNED 03/20/2013 20:50

## 2015-03-25 NOTE — Discharge Summary (Signed)
PATIENT NAME:  Margaret Romero, Margaret Romero MR#:  [id number] DATE OF BIRTH:  [birth year]  DATE OF ADMISSION:  10/14/2014 DATE OF DISCHARGE:  10/19/2014  DISCHARGE DIAGNOSES:  1.  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  2.  Exacerbation of chronic lung disease.  3.  Postoperative hypoxia.   OPERATIONS PERFORMED: Right carotid endarterectomy with CorMatrix patch angioplasty on 10/14/2014.   CONSULTATIONS:  1.  Internal medicine: [facility].  2.  Pulmonary medicine: Vilinda Boehringer, MD  HISTORY: [name] is a 69 year old woman who presented to the office and was found to critical stenosis of the right internal carotid artery. After workup and preoperative clearance, she was scheduled for surgery.   HOSPITAL COURSE: On the day of admission, she underwent a right carotid endarterectomy.  Intraoperatively she did just fine. Postoperatively, her course was complicated with some mild hypoxia; at rest she would be a 90% saturation, but upon getting up and walking, she would drop into the high 70s and low 80s. The hospital physicians were contacted and workup showed some atelectasis. Appropriate pulmonary interventions were managed; however, over the course of the next 48 hours, she did not seem to improve, and therefore pulmonary medicine was consult. Over the course of the next 3 days, she did somewhat better, but still was not maintaining saturations greater than 90% with walking, and therefore she was arranged to have oxygen at home.   On postoperative day 5, she was discharged to home. She was in stable condition. She has had no neurological sequela secondary to her surgery and her surgery site is healing quite nicely. She is to maintain her surgery site clean and dry. Diet is healthy heart. Activities are light activities as tolerated; no driving until seen back in the office. 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 She will follow up with me in  7-10 days. She will follow up with Dr. Stevenson Clinch, in 1-2 weeks. She will follow up with her primary medical service in 1-2 weeks. She will continue oxygen therapy at home per Dr. Stevenson Clinch.    ____________________________ Katha Cabal, MD ggs:MT D: 11/07/2014 16:22:43 ET T: 11/07/2014 16:57:49 ET JOB#: [id number]  cc: Katha Cabal, MD, <Dictator> Vilinda Boehringer, MD Jerrell Belfast, MD Katha Cabal MD ELECTRONICALLY SIGNED 11/11/2014 11:27

## 2015-03-25 NOTE — Op Note (Signed)
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   POSTOPERATIVE DIAGNOSIS: Critical stenosis of the right internal carotid artery.   PROCEDURES PERFORMED: 1.  Right carotid endarterectomy with CorMatrix patch angioplasty.  2.  Repair of arterial defect with xenograft CorMatrix patch.   SURGEON: [name], M.D.   ANESTHESIA: General by endotracheal intubation.   FLUIDS: Per anesthesia record.   ESTIMATED BLOOD LOSS: 100 mL.   SPECIMEN: Plaque to pathology for permanent section.   INDICATIONS: Margaret Romero is a 69 year old woman who was found to have carotid stenosis. CT angiography demonstrated tandem lesions, one right at the bifurcation on the right side, which was greater than 90%, and a second approximately 2 to 3 cm more proximal in the common carotid, which was approximately 70%. The risks and benefits for endarterectomy were reviewed. All questions answered. Alternative therapies were discussed as well. The patient agrees to proceed with surgery.   DESCRIPTION OF PROCEDURE: The patient is taken to the operating room and placed in the supine position. After adequate general anesthesia is induced, appropriate invasive monitors are placed, she is positioned supine. Her neck is then extended slightly, rotated toward the left and the neck and chest wall are prepped and draped in a sterile fashion. Appropriate timeout is called.   A curvilinear incision is created along the anterior margin of the sternocleidomastoid muscle and carried down through the soft tissues transecting the platysma and then ligating the external jugular vein between 2-0 silk ties. The sternocleidomastoid muscle was then reflected posterolaterally exposing the omohyoid and the common carotid artery was identified at this level. Dissection was then carried along the anterior  margin of the common carotid artery up to the bifurcation. Superior thyroidal artery as well as the external carotid artery are looped with Silastic vessel loops. The facial vein was ligated and divided between 2-0 silk ties. Several small venous tributaries are ligated and divided between 3-0 silk ties. The vagus as well as the hypoglossal nerves were readily identified and left undisturbed. Circumferential dissection is then carried out along the common carotid. Palpation of this proximal lesion is then used to localize it and the dissection is extended to approximately 1.5 cm below this lesion. The internal carotid artery is dissected circumferentially. Then 7000 units of heparin is given and allowed to circulate for 5 minutes.   The common followed by the external followed by internal carotid artery are clamped. Arteriotomy is made with a #11 blade and extended with Potts scissors. An indwelling Sundt shunt is then placed without difficulty and flow is re-established to the brain.   Endarterectomy is then performed under direct visualization for the bulb and internal. The external is created with eversion. The more proximal lesion is then addressed. It 1 continuous endarterectomy site extending from the proximal portion through the distal and common carotid artery, the bulb and then the internal. Interrupted 7-0 Prolene sutures are used to tack the distal intimal edge within the internal carotid artery and interrupted 6-0 Prolene sutures are used to tack the proximal intimal edge within the common carotid artery. A CorMatrix patch is then hydrated and delivered onto the field, beveled and applied to repair the arterial defect using running 6-0 Prolene in a 4 quadrant technique. The shunt is removed. Copious irrigation of the endarterectomy site is performed with heparinized saline, and the suture line is completed. Flow  is re-established first to the external carotid artery and then the internal carotid  artery to prevent distal embolization. The suture line is then evaluated. A small area is noted to be bleeding and this is easily controlled with a figure-of-eight 6-0 Prolene interrupted suture. Evicel is placed along the deep portions in the wound and Surgicel placed along the suture line. The platysma is then reapproximated followed by closure of the skin with 4-0 Monocryl subcuticular and Dermabond is applied. The patient tolerated the procedure well. She was awakened in the operating room, moving all extremities, and is taken to the recovery area in stable condition. ____________________________ Margaret Cabal, MD ggs:sb D: 10/14/2014 10:30:10 ET T: 10/14/2014 10:52:48 ET JOB#: [id number]  cc: Margaret Cabal, MD, <Dictator> Jerrell Belfast, MD Margaret Cabal MD ELECTRONICALLY SIGNED 11/01/2014 12:59

## 2015-03-25 NOTE — Op Note (Signed)
PATIENT NAME:  Margaret Romero, Margaret Romero MR#:  [id number] DATE OF BIRTH:  Aug 30, 1946  DATE OF PROCEDURE:  09/14/2014  PREOPERATIVE DIAGNOSES: 1. Significant right carotid artery stenosis with disparate findings on CT and ultrasound.  2. Irritable bowel syndrome/colitis.  3. Coronary artery disease, status post stent placement.  4. Hypertension.  5. Hyperlipidemia.   POSTOPERATIVE DIAGNOSES:  1. Significant right carotid artery stenosis with disparate findings on CT and ultrasound.  2. Irritable bowel syndrome/colitis.  3. Coronary artery disease, status post stent placement.  4. Hypertension.  5. Hyperlipidemia.   PROCEDURES: 1. Ultrasound guidance for vascular access to right femoral artery.  2. Catheter placement into right common carotid artery and left common carotid artery from right femoral approach.  3. Thoracic aortogram and bilateral cervical and cerebral carotid angiograms.  4. StarClose closure device, right femoral artery.   SURGEON: Algernon Huxley, MD.   ANESTHESIA: Local with moderate conscious sedation.   ESTIMATED BLOOD LOSS: 25 mL.   FLUOROSCOPY TIME: Approximately 5 minutes.   CONTRAST: 70 mL Visipaque.   INDICATION FOR PROCEDURE: A 69 year old female with a history of carotid artery stenosis. She was seen and had a duplex, which suggested a high-grade right carotid artery stenosis and mild to moderate left carotid artery stenosis. She then had a CT angiogram, which was officially read out as a 50% stenosis of the right internal carotid artery. I disagreed with the CT angiogram and felt this was a higher degree of stenosis, but with the disparate findings on ultrasound and CT scan, an angiogram was performed for more thorough evaluation prior to planning whether or not surgical intervention is recommended. Risks and benefits were discussed. Informed consent was obtained.   DESCRIPTION OF PROCEDURE: The patient was brought to the vascular suite. Groins were shaved and prepped  and a sterile surgical field was created. The right femoral artery was visualized with ultrasound and found to be patent. It was then accessed under direct ultrasound guidance without difficulty with the Seldinger needle. A J-wire and 5-French sheath were placed. The patient was given 3000 units of intravenous heparin. A pigtail catheter was then placed into the ascending aorta and a LAO projection thoracic aortogram was performed. This demonstrated a normal configuration of the aortic arch with no significant stenosis in the innominate, proximal left common carotid artery or proximal left subclavian artery. I then used a Headhunter catheter. I selectively cannulated the innominate artery and advanced into the right common carotid artery and selective right lower extremity angiogram was then performed.   This demonstrated what measured out to be an 88% stenosis of the right internal carotid artery just beyond its origin. This was a short segment lesion and the remainder of the internal carotid artery distally was of reasonably normal caliber. Intracerebrally on the right, she had a normal middle cerebral artery, but no significant anterior cerebral artery filling whatsoever on our imaging. With the high-grade stenosis, I elected to evaluate the left carotid as well, as she would be a candidate for surgical revascularization and this will help identify any intracerebral defects with cross-filling or other issues. I back to the LAO projection in the chest, cannulated the left common carotid artery without difficulty with a Headhunter catheter and advanced into the mid left common carotid artery. Cervical and cerebral carotid angiogram was then performed.   This showed very mild disease in the proximal internal carotid artery that was less than 30% with brisk flow intracerebrally. There was brisk cross-filling in the anterior cerebral artery,  left to right, and a normal middle cerebral artery with no focal deficits  appreciated. At this point, I elected to terminate the procedure. The diagnostic catheter was removed. Oblique arteriogram was performed of the right femoral artery and a StarClose closure device was deployed in the usual fashion with excellent hemostatic result. The patient tolerated the procedure well and was taken to the recovery room in stable condition.     ____________________________ Algernon Huxley, MD jsd:TT D: 09/14/2014 13:31:51 ET T: 09/14/2014 14:52:25 ET JOB#: [id number]  cc: Algernon Huxley, MD, <Dictator> Jerrell Belfast, MD Algernon Huxley MD ELECTRONICALLY SIGNED 09/17/2014 12:37

## 2015-03-25 NOTE — Consult Note (Signed)
PATIENT NAME:  Margaret Romero, Margaret Romero MR#:  [id number] DATE OF BIRTH:  March 18, 1946  DATE OF CONSULTATION:  10/15/2014  PRIMARY CARE PHYSICIAN: Jerrell Belfast, MD  REFERRING PHYSICIAN:  Katha Cabal, MD  CONSULTING PHYSICIAN:  Dimitri Dsouza P. Benjie Karvonen, MD  REASON FOR REQUEST: Postoperative hypoxia.   IMPRESSION: 1. Postoperative hypoxia, likely secondary to atelectasis.  2. Postoperative day #1 right carotid endarterectomy.  3. History of hypertension.  4. History of thyroid cancer and hypothyroidism on Synthroid.  5. History of hyperlipidemia.   PLAN: 1. I have ordered a chest x-ray to evaluate her lung function.  2. Continue to wean oxygen as you are.  3. Would recommend incentive spirometer at bedside.  4. Would recommend that patient be out of bed, ambulate as possible and up to chair.  5. Continue outpatient medications.   HISTORY OF PRESENT ILLNESS: This is a very pleasant, 69 year old female with a history of carotid artery stenosis, postoperative day #1 for right CEA, hypertension, hypothyroidism. Hospitalist was consulted for postoperative hypoxia. Since her procedure yesterday, she was noted to be about 87% to 91% on room air. The patient is a very active woman, it out gardening and has no history of lung disease. She had no respiratory issues prior to coming into the hospital. She said she was doing fine. No cough, wheezing, hemoptysis.   REVIEW OF SYSTEMS:  CONSTITUTIONAL: No fever, chills, weight gain or weight loss.  EYES:  No blurred or double vision.   EARS, NOSE, THROAT: No ear pain, redness of oropharynx, tinnitus.  RESPIRATORY: No cough, wheezing, hemoptysis, COPD.   CARDIOVASCULAR: No orthopnea, palpitation or syncope.   GASTROINESTINAL: No nausea, vomiting, diarrhea, abdominal pain, melena or ulcers.   GENITOURINARY: No hematuria.   ENDOCRINE: No polyuria or polydipsia. No heat or cold intolerance.   HEMATOLOGIC/LYMPHATIC: No bruising, bleeding or swollen glands.    SKIN: No rash or lesions.   MUSCULOSKELETAL: No arthritis, swelling or gout.   NEUROLOGIC: No history of seizures. No ataxia.   PSYCHIATRIC:  Positive history of bipolar depression.   PAST MEDICAL HISTORY: 1. Hypertension.  2. Hyperlipidemia.  3. Hypothyroidism.  4. GERD.  5. History of thyroid cancer.  6. History of carotid artery stenosis, status post right CEA postoperative day #1.  7. Colitis with irritable bowel syndrome.  8. Bipolar disorder with depression.   ALLERGIES: No known drug allergies.   SOCIAL HISTORY: No tobacco, alcohol or drug use.   FAMILY HISTORY: Positive for CAD.   MEDICATIONS:  1. Aspirin 81 mg daily.  2. Cozaar 50 mg daily.  3. Lexapro 10 mg daily.  4. Imdur 30 mg daily.  5. Synthroid 75 mcg daily.  6. Mesalamine 800 mg p.o. b.i.d.  7. Nitroglycerin sublingual p.r.n. chest pain.  8. Gemfibrozil 40 mg daily.  9. Zocor 40 mg daily.   PHYSICAL EXAMINATION:  VITAL SIGNS: The patient is afebrile. Temperature is 97.9, pulse is 74, respirations 16, blood pressure 119/75, 90% on room air.  GENERAL: The patient is alert, oriented, not in acute distress.   HEENT: Head is atraumatic. Pupils equal and anicteric sclerae. Mucous membranes are moist. Oropharynx clear.   NECK: Supple. She has an incision on the right side which is clean and intact. No lymphadenopathy.   CARDIOVASCULAR: Regular rate and rhythm. No murmurs, gallops or rubs. PMI is not displaced.   LUNGS: She has decreased breath sounds bilaterally at the bases without any crackles, rales, rhonchi or wheezing.   ABDOMEN: Bowel sounds are positive. Nontender,  nondistended. No hepatosplenomegaly.   EXTREMITIES: No clubbing, cyanosis or edema.   NEUROLOGIC: Cranial nerves 2 through 12 are intact. There are no focal deficits.   SKIN: Without rashes or lesions.   LABORATORY DATA: White blood cells 4.3, hemoglobin 10, hematocrit 32, platelets are 77,000. Sodium 145, potassium 4.1,  chloride 110, bicarbonate 27, BUN 15, creatinine 0.99 glucose is 94 and INR is 1.3.   DIAGNOSTIC DATA: Chest x-ray shows on my interpretation of the chest x-ray looks like bilateral atelectasis.   Plan of care was discussed with the family. Thank you for allowing me to participate in the care of this patient. We will continue to follow. Time spent on this consult was 45 minutes.     ____________________________ Donell Beers. Benjie Karvonen, MD spm:TT D: 10/15/2014 16:02:45 ET T: 10/15/2014 16:15:37 ET JOB#: [id number]  cc: Zakiah Gauthreaux P. Benjie Karvonen, MD, <Dictator> Donell Beers Krisna Omar MD ELECTRONICALLY SIGNED 10/15/2014 18:31

## 2015-03-27 LAB — SURGICAL PATHOLOGY

## 2015-04-05 ENCOUNTER — Encounter: Payer: Self-pay | Admitting: General Surgery

## 2015-06-01 ENCOUNTER — Other Ambulatory Visit: Payer: Self-pay | Admitting: Family Medicine

## 2015-06-01 DIAGNOSIS — K51919 Ulcerative colitis, unspecified with unspecified complications: Secondary | ICD-10-CM

## 2015-06-01 DIAGNOSIS — K5190 Ulcerative colitis, unspecified, without complications: Secondary | ICD-10-CM | POA: Insufficient documentation

## 2015-06-04 ENCOUNTER — Other Ambulatory Visit: Payer: Self-pay | Admitting: General Surgery

## 2015-06-13 ENCOUNTER — Other Ambulatory Visit: Payer: Self-pay | Admitting: Family Medicine

## 2015-06-13 DIAGNOSIS — I10 Essential (primary) hypertension: Secondary | ICD-10-CM

## 2015-06-29 ENCOUNTER — Other Ambulatory Visit: Payer: Self-pay

## 2015-06-29 DIAGNOSIS — Z85850 Personal history of malignant neoplasm of thyroid: Secondary | ICD-10-CM

## 2015-08-09 DIAGNOSIS — Z85850 Personal history of malignant neoplasm of thyroid: Secondary | ICD-10-CM | POA: Diagnosis not present

## 2015-08-10 LAB — CALCITONIN: CALCITONIN: 29 pg/mL — AB (ref 0.0–5.0)

## 2015-08-10 LAB — CEA: CEA: 5 ng/mL — AB (ref 0.0–4.7)

## 2015-08-12 ENCOUNTER — Other Ambulatory Visit: Payer: Self-pay | Admitting: Family Medicine

## 2015-08-12 DIAGNOSIS — K219 Gastro-esophageal reflux disease without esophagitis: Secondary | ICD-10-CM

## 2015-08-12 DIAGNOSIS — I10 Essential (primary) hypertension: Secondary | ICD-10-CM

## 2015-08-12 DIAGNOSIS — F419 Anxiety disorder, unspecified: Secondary | ICD-10-CM

## 2015-08-14 DIAGNOSIS — F419 Anxiety disorder, unspecified: Secondary | ICD-10-CM | POA: Insufficient documentation

## 2015-08-14 NOTE — Telephone Encounter (Signed)
Last OV 08/2014  Thanks,   -Mickel Baas

## 2015-08-16 ENCOUNTER — Ambulatory Visit (INDEPENDENT_AMBULATORY_CARE_PROVIDER_SITE_OTHER): Payer: Medicare Other | Admitting: General Surgery

## 2015-08-16 ENCOUNTER — Encounter: Payer: Self-pay | Admitting: General Surgery

## 2015-08-16 VITALS — BP 114/68 | HR 70 | Resp 12 | Ht 63.0 in | Wt 141.0 lb

## 2015-08-16 DIAGNOSIS — Z85850 Personal history of malignant neoplasm of thyroid: Secondary | ICD-10-CM | POA: Diagnosis not present

## 2015-08-16 NOTE — Patient Instructions (Addendum)
Call with any questions or concerns. Return in 6 months. Get CEA and Calcitonin prior to 6 months.

## 2015-08-16 NOTE — Progress Notes (Signed)
Patient ID: Margaret Romero, female   DOB: 1946/01/16, 69 y.o.   MRN: [id number]  Chief Complaint  Patient presents with  . Follow-up    Thyroid Cancer    HPI Margaret Romero is a 69 y.o. female here today to follow up from her thyroid cancer. Patient feeling well, denies any dietary or bowel issues. Her  Friend/caregiver [name] is present for today. She does have some memory issues at times. 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 She denies any neck symptoms.  HPI  Past Medical History  Diagnosis Date  . Heart disease   . Hypertension   . Bipolar disorder   . 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 GERD (gastroesophageal reflux disease)   . Malignant neoplasm of thyroid gland January 23, 2011    medullary carcinoma thyroid T2, Nx.  . IBS (irritable bowel syndrome) 2000    Past Surgical History  Procedure Laterality Date  . Hernia repair  2007  . Coronary stent placement  2007  . Cholecystectomy  2006  . Thyroidectomy  2012  . Colonoscopy  2008  . Nasal sinus surgery  2008  . Carotid endarterectomy Right November 2015    [name], MD    Family History  Problem Relation Age of Onset  . Cancer Sister     Bone  . Cancer Brother     Bone  . Cancer Sister     Breast  . Cancer Sister     Colon     Social History Social History  Substance Use Topics  . Smoking status: Former Smoker -- 1.00 packs/day for 10 years  . Smokeless tobacco: Never Used  . Alcohol Use: No    No Known Allergies  Current Outpatient Prescriptions  Medication Sig Dispense Refill  . albuterol (PROVENTIL HFA;VENTOLIN HFA) 108 (90 BASE) MCG/ACT inhaler Inhale 2 puffs into the lungs every 4 (four) hours as needed for wheezing or shortness of breath. 1 Inhaler 5  . aspirin 81 MG tablet Take 81 mg by mouth daily.    Marland Kitchen escitalopram (LEXAPRO) 10 MG tablet TAKE ONE TABLET BY MOUTH EVERY DAY 90 tablet  0  . isosorbide mononitrate (IMDUR) 30 MG 24 hr tablet TAKE ONE TABLET BY MOUTH EVERY DAY 90 tablet 3  . levothyroxine (SYNTHROID, LEVOTHROID) 75 MCG tablet TAKE ONE TABLET BY MOUTH EVERY DAY 30 tablet 10  . losartan (COZAAR) 50 MG tablet Take 1 tablet by mouth daily.    . Mesalamine (DELZICOL) 400 MG CPDR DR capsule Take two capsules by mouth three times daily 180 capsule 5  . Multiple Vitamins-Minerals (MULTIVITAMIN WITH MINERALS) tablet Take 1 tablet by mouth daily.    . nitroGLYCERIN (NITROSTAT) 0.4 MG SL tablet Place under the tongue.    . pantoprazole (PROTONIX) 40 MG tablet TAKE ONE TABLET BY MOUTH EVERY DAY 90 tablet 0  . simvastatin (ZOCOR) 40 MG tablet Take 1 tablet by mouth daily.     No current facility-administered medications for this visit.    Review of Systems Review of Systems  Constitutional: Negative.   Respiratory: Negative.   Cardiovascular: Negative.     Blood pressure 114/68, pulse 70, resp. rate 12, height 5' 3"  (1.6 m), weight 141 lb (63.957 kg).  Physical Exam Physical Exam  Constitutional: She is oriented to person, place, and time. She appears well-developed and well-nourished.  HENT:  Mouth/Throat: Oropharynx is clear and moist.  Eyes: Conjunctivae are normal. No scleral icterus.  Neck: Neck supple. No thyromegaly present.  Well healed scars from previous thyroidectomy   Cardiovascular: Normal rate, regular rhythm and normal heart sounds.   Pulmonary/Chest: Effort normal and breath sounds normal.  Abdominal: Soft. Bowel sounds are normal.  Lymphadenopathy:    She has no cervical adenopathy.       Right: No supraclavicular adenopathy present.       Left: No supraclavicular adenopathy present.  Neurological: She is alert and oriented to person, place, and time.  Skin: Skin is warm and dry.  Psychiatric: Her behavior is normal.    Data Reviewed Calcitonin: 29; CEA 5.0. Both minimal interval change since spring 2016.  Previous CT scan of the neck,  chest, abdomen and pelvis was negative. Brain MRI was negative in spring 2016.  Assessment    Elevated tumor markers without clinically identifiable disease.    Plan    Literature review suggested no benefit from blind neck dissection. For values of calcitonin less than 150, observation is reasonable.         Return in 6 moths. Get a CEA and Calcitonin before visit.    PCP: Etheleen Mayhew 08/17/2015, 7:57 AM

## 2015-09-25 DIAGNOSIS — R0602 Shortness of breath: Secondary | ICD-10-CM | POA: Diagnosis not present

## 2015-09-25 DIAGNOSIS — Z9861 Coronary angioplasty status: Secondary | ICD-10-CM | POA: Diagnosis not present

## 2015-09-25 DIAGNOSIS — E7800 Pure hypercholesterolemia, unspecified: Secondary | ICD-10-CM | POA: Diagnosis not present

## 2015-09-25 DIAGNOSIS — I10 Essential (primary) hypertension: Secondary | ICD-10-CM | POA: Diagnosis not present

## 2015-09-25 DIAGNOSIS — I2510 Atherosclerotic heart disease of native coronary artery without angina pectoris: Secondary | ICD-10-CM | POA: Diagnosis not present

## 2015-10-11 ENCOUNTER — Other Ambulatory Visit: Payer: Self-pay

## 2015-10-11 DIAGNOSIS — K51919 Ulcerative colitis, unspecified with unspecified complications: Secondary | ICD-10-CM

## 2015-10-11 MED ORDER — MESALAMINE 400 MG PO CPDR: 800.0000 mg | DELAYED_RELEASE_CAPSULE | Freq: Three times a day (TID) | ORAL | Status: DC

## 2015-10-25 ENCOUNTER — Telehealth: Payer: Self-pay | Admitting: Family Medicine

## 2015-11-16 DIAGNOSIS — Z6822 Body mass index (BMI) 22.0-22.9, adult: Secondary | ICD-10-CM | POA: Insufficient documentation

## 2015-11-16 DIAGNOSIS — E785 Hyperlipidemia, unspecified: Secondary | ICD-10-CM | POA: Insufficient documentation

## 2015-11-16 DIAGNOSIS — F32A Depression, unspecified: Secondary | ICD-10-CM | POA: Insufficient documentation

## 2015-11-16 DIAGNOSIS — K5792 Diverticulitis of intestine, part unspecified, without perforation or abscess without bleeding: Secondary | ICD-10-CM | POA: Insufficient documentation

## 2015-11-16 DIAGNOSIS — F329 Major depressive disorder, single episode, unspecified: Secondary | ICD-10-CM | POA: Insufficient documentation

## 2015-11-16 DIAGNOSIS — D619 Aplastic anemia, unspecified: Secondary | ICD-10-CM | POA: Insufficient documentation

## 2015-11-16 DIAGNOSIS — Z9889 Other specified postprocedural states: Secondary | ICD-10-CM | POA: Insufficient documentation

## 2015-11-16 DIAGNOSIS — S62109A Fracture of unspecified carpal bone, unspecified wrist, initial encounter for closed fracture: Secondary | ICD-10-CM

## 2015-11-16 DIAGNOSIS — Z955 Presence of coronary angioplasty implant and graft: Secondary | ICD-10-CM | POA: Insufficient documentation

## 2015-11-16 DIAGNOSIS — I6529 Occlusion and stenosis of unspecified carotid artery: Secondary | ICD-10-CM | POA: Insufficient documentation

## 2015-11-16 DIAGNOSIS — R413 Other amnesia: Secondary | ICD-10-CM | POA: Insufficient documentation

## 2015-11-16 DIAGNOSIS — K529 Noninfective gastroenteritis and colitis, unspecified: Secondary | ICD-10-CM | POA: Insufficient documentation

## 2015-11-16 DIAGNOSIS — K5790 Diverticulosis of intestine, part unspecified, without perforation or abscess without bleeding: Secondary | ICD-10-CM | POA: Insufficient documentation

## 2015-11-16 DIAGNOSIS — C73 Malignant neoplasm of thyroid gland: Secondary | ICD-10-CM | POA: Insufficient documentation

## 2015-11-16 DIAGNOSIS — N3000 Acute cystitis without hematuria: Secondary | ICD-10-CM | POA: Insufficient documentation

## 2015-11-16 DIAGNOSIS — I2510 Atherosclerotic heart disease of native coronary artery without angina pectoris: Secondary | ICD-10-CM | POA: Insufficient documentation

## 2015-11-16 DIAGNOSIS — E041 Nontoxic single thyroid nodule: Secondary | ICD-10-CM | POA: Insufficient documentation

## 2015-11-16 HISTORY — DX: Fracture of unspecified carpal bone, unspecified wrist, initial encounter for closed fracture: S62.109A

## 2015-11-20 ENCOUNTER — Encounter: Payer: Self-pay | Admitting: Family Medicine

## 2015-11-20 ENCOUNTER — Ambulatory Visit (INDEPENDENT_AMBULATORY_CARE_PROVIDER_SITE_OTHER): Payer: Medicare Other | Admitting: Family Medicine

## 2015-11-20 ENCOUNTER — Other Ambulatory Visit: Payer: Self-pay

## 2015-11-20 VITALS — BP 140/60 | HR 72 | Temp 97.9°F | Resp 16 | Ht 62.5 in | Wt 143.0 lb

## 2015-11-20 DIAGNOSIS — R413 Other amnesia: Secondary | ICD-10-CM

## 2015-11-20 DIAGNOSIS — K51919 Ulcerative colitis, unspecified with unspecified complications: Secondary | ICD-10-CM

## 2015-11-20 DIAGNOSIS — I10 Essential (primary) hypertension: Secondary | ICD-10-CM

## 2015-11-20 DIAGNOSIS — D61818 Other pancytopenia: Secondary | ICD-10-CM | POA: Insufficient documentation

## 2015-11-20 DIAGNOSIS — F3181 Bipolar II disorder: Secondary | ICD-10-CM | POA: Diagnosis not present

## 2015-11-20 DIAGNOSIS — L989 Disorder of the skin and subcutaneous tissue, unspecified: Secondary | ICD-10-CM | POA: Diagnosis not present

## 2015-11-20 MED ORDER — MESALAMINE 1.2 G PO TBEC: 2.4000 g | DELAYED_RELEASE_TABLET | Freq: Every day | ORAL | Status: DC

## 2015-11-20 NOTE — Progress Notes (Signed)
Subjective:    Patient ID: Margaret Romero, female    DOB: [birth year], 69 y.o.   MRN: [id number]  Depression      The patient presents with depression.  This is a chronic problem.  The current episode started more than 1 year ago.   The onset quality is undetermined.   The problem occurs daily.  The problem has been gradually worsening since onset.  Associated symptoms include decreased concentration, hopelessness (due to memory loss), insomnia, irritable, restlessness and sad.  Associated symptoms include no fatigue, no helplessness, no decreased interest (in television), no appetite change, no body aches and no suicidal ideas.  Past treatments include SSRIs - Selective serotonin reuptake inhibitors (Escitalopram 10 mg. Requesting increase on Lexapro due to worsening depression. Does have some mania. ).  Compliance with treatment is good.  Risk factors include history of mental illness.   Past medical history includes bipolar disorder and depression.     Pertinent negatives include no physical disability.  Memory Loss Pt's memory is worsening. Is believed to be secondary to anesthesia event. Was under anesthesia for longer than anticipated.   Pt made a 14 today on the 6CIT.    Ulcerative Colitis Insurance will no longer cover pt's Mesalamine. Is requesting change to Lialda. Does not know the dose.    Review of Systems  Constitutional: Negative for appetite change and fatigue.  Psychiatric/Behavioral: Positive for depression and decreased concentration. Negative for suicidal ideas. The patient has insomnia.    BP 140/60 mmHg  Pulse 72  Temp(Src) 97.9 F (36.6 C) (Oral)  Resp 16  Ht 5' 2.5" (1.588 m)  Wt 143 lb (64.864 kg)  BMI 25.72 kg/m2   Patient Active Problem List   Diagnosis Date Noted  . Pancytopenia ([place]) 11/20/2015  . Bipolar 1 disorder ([place]) 11/16/2015  . Body mass index (BMI) of 22.0-22.9 in adult 11/16/2015  . Arteriosclerosis of coronary artery 11/16/2015  . Cancer of  thyroid ([place]) 11/16/2015  . Colitis 11/16/2015  . Acute cystitis 11/16/2015  . Clinical depression 11/16/2015  . 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 Presence of stent in coronary artery 11/16/2015  . HLD (hyperlipidemia) 11/16/2015  . Adaptive colitis 11/16/2015  . Bad memory 11/16/2015  . 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 Carotid artery narrowing 11/16/2015  . Thyroid nodule 11/16/2015  . Carpal bone fracture 11/16/2015  . Acid reflux 08/14/2015  . Anxiety 08/14/2015  . Hypertension 06/13/2015  . Ulcerative colitis ([place]) 06/01/2015  . Absolute anemia 12/15/2014  . Fecal occult blood test positive 12/15/2014  . SOB (shortness of breath) on exertion 11/01/2014  . Hypoxemia 11/01/2014  . Breath shortness 11/01/2014  . BP (high blood pressure) 02/24/2014  . Personal history of malignant neoplasm of thyroid 01/13/2014   Past Medical History  Diagnosis Date  . Heart disease   . Hypertension   . Bipolar disorder ([place])   . 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 GERD (gastroesophageal reflux disease)   . Malignant neoplasm of thyroid gland [facility]) January 23, 2011    medullary carcinoma thyroid T2, Nx.  . IBS (irritable bowel syndrome) 2000   Current Outpatient Prescriptions on File Prior to Visit  Medication Sig  . aspirin 81 MG tablet Take 81 mg by mouth daily.  Marland Kitchen escitalopram (LEXAPRO) 10 MG tablet TAKE ONE TABLET BY MOUTH EVERY DAY  . isosorbide mononitrate (IMDUR) 30 MG 24 hr tablet TAKE ONE TABLET BY MOUTH EVERY DAY  .  levothyroxine (SYNTHROID, LEVOTHROID) 75 MCG tablet TAKE ONE TABLET BY MOUTH EVERY DAY  . losartan (COZAAR) 50 MG tablet Take 1 tablet by mouth daily.  . Mesalamine (DELZICOL) 400 MG CPDR DR capsule Take 2 capsules (800 mg total) by mouth 3 (three) times daily.  . Multiple Vitamins-Minerals (MULTIVITAMIN WITH MINERALS) tablet Take 1 tablet by mouth daily.  . nitroGLYCERIN (NITROSTAT) 0.4 MG SL tablet Place  under the tongue.  . pantoprazole (PROTONIX) 40 MG tablet TAKE ONE TABLET BY MOUTH EVERY DAY  . simvastatin (ZOCOR) 40 MG tablet Take 1 tablet by mouth daily.   No current facility-administered medications on file prior to visit.   No Known Allergies Past Surgical History  Procedure Laterality Date  . Hernia repair  2007  . Coronary stent placement  2007  . Cholecystectomy  2006  . Thyroidectomy  2012  . Colonoscopy  2008  . Nasal sinus surgery  2008  . Carotid endarterectomy Right November 2015    [name], MD   Social History   Social History  . Marital Status: Single    Spouse Name: N/A  . Number of Children: N/A  . Years of Education: N/A   Occupational History  . Not on file.   Social History Main Topics  . Smoking status: Former Smoker -- 1.00 packs/day for 10 years    Quit date: 12/01/1974  . Smokeless tobacco: Never Used  . Alcohol Use: No  . Drug Use: No  . Sexual Activity: Not on file   Other Topics Concern  . Not on file   Social History Narrative   Family History  Problem Relation Age of Onset  . Cancer Sister     Bone  . Cancer Brother     Bone  . Cancer Sister     Breast  . Cancer Sister     Colon       Objective:   Physical Exam  Constitutional: She is oriented to person, place, and time. She appears well-developed and well-nourished. She is irritable.  Cardiovascular: Normal rate and regular rhythm.   Pulmonary/Chest: Effort normal and breath sounds normal.  Neurological: She is alert and oriented to person, place, and time.  Skin: Skin is warm.  1 cm lesion, scaly.    Psychiatric: She has a normal mood and affect. Her behavior is normal. Judgment and thought content normal.   BP 140/60 mmHg  Pulse 72  Temp(Src) 97.9 F (36.6 C) (Oral)  Resp 16  Ht 5' 2.5" (1.588 m)  Wt 143 lb (64.864 kg)  BMI 25.72 kg/m2     Assessment & Plan:  1. Bad memory New problem. Worsening.  Will start medication and refer.   - TSH - Vitamin  B12 - Ambulatory referral to Psychiatry - Ambulatory referral to Neurology  2. Bipolar 2 disorder ([place]) Does have some manic symptoms. Will refer to psychiatry. Unclear if increasing medication will make this worse.   - Ambulatory referral to Psychiatry  3. Essential hypertension Stable.  Continue medication.   - CBC with Differential/Platelet - Comprehensive Metabolic Panel (CMET)  4. Ulcerative colitis with complication, unspecified location [facility]) Will change medication to what is on formulary.    5. Skin lesion Will refer.   - Ambulatory referral to Dermatology   Margarita Rana, MD

## 2015-11-20 NOTE — Telephone Encounter (Signed)
This is way Lialda needs to be taken per Gerald Stabs at [facility]. Renaldo Fiddler, CMA

## 2015-11-21 ENCOUNTER — Telehealth: Payer: Self-pay

## 2015-11-21 LAB — COMPREHENSIVE METABOLIC PANEL
A/G RATIO: 1.5 (ref 1.1–2.5)
ALBUMIN: 4 g/dL (ref 3.5–4.8)
ALK PHOS: 128 IU/L — AB (ref 39–117)
ALT: 17 IU/L (ref 0–32)
AST: 47 IU/L — ABNORMAL HIGH (ref 0–40)
BILIRUBIN TOTAL: 1.3 mg/dL — AB (ref 0.0–1.2)
BUN / CREAT RATIO: 14 (ref 11–26)
BUN: 10 mg/dL (ref 8–27)
CHLORIDE: 107 mmol/L — AB (ref 96–106)
CO2: 24 mmol/L (ref 18–29)
Calcium: 8.8 mg/dL (ref 8.7–10.3)
Creatinine, Ser: 0.73 mg/dL (ref 0.57–1.00)
GFR calc non Af Amer: 83 mL/min/{1.73_m2} (ref >59)
GFR, EST AFRICAN AMERICAN: 96 mL/min/{1.73_m2} (ref >59)
GLOBULIN, TOTAL: 2.6 g/dL (ref 1.5–4.5)
GLUCOSE: 74 mg/dL (ref 65–99)
POTASSIUM: 3.9 mmol/L (ref 3.5–5.2)
SODIUM: 144 mmol/L (ref 134–144)
TOTAL PROTEIN: 6.6 g/dL (ref 6.0–8.5)

## 2015-11-21 LAB — VITAMIN B12: VITAMIN B 12: 951 pg/mL — AB (ref 211–946)

## 2015-11-21 LAB — CBC WITH DIFFERENTIAL/PLATELET
BASOS ABS: 0 10*3/uL (ref 0.0–0.2)
BASOS: 0 %
EOS (ABSOLUTE): 0.1 10*3/uL (ref 0.0–0.4)
Eos: 1 %
HEMOGLOBIN: 11.1 g/dL (ref 11.1–15.9)
Hematocrit: 33.5 % — ABNORMAL LOW (ref 34.0–46.6)
Immature Grans (Abs): 0 10*3/uL (ref 0.0–0.1)
Immature Granulocytes: 0 %
LYMPHS ABS: 0.9 10*3/uL (ref 0.7–3.1)
Lymphs: 24 %
MCH: 30.6 pg (ref 26.6–33.0)
MCHC: 33.1 g/dL (ref 31.5–35.7)
MCV: 92 fL (ref 79–97)
MONOCYTES: 6 %
Monocytes Absolute: 0.2 10*3/uL (ref 0.1–0.9)
NEUTROS ABS: 2.5 10*3/uL (ref 1.4–7.0)
Neutrophils: 69 %
Platelets: 100 10*3/uL — CL (ref 150–379)
RBC: 3.63 x10E6/uL — ABNORMAL LOW (ref 3.77–5.28)
RDW: 16.3 % — ABNORMAL HIGH (ref 12.3–15.4)
WBC: 3.7 10*3/uL (ref 3.4–10.8)

## 2015-11-21 LAB — TSH: TSH: 10.07 u[IU]/mL — AB (ref 0.450–4.500)

## 2015-11-21 NOTE — Telephone Encounter (Signed)
-----   Message from Margarita Rana, MD sent at 11/21/2015  1:38 PM EST ----- Thyroid slightly low but stable. B12 normal. Liver enzymes very mildly elevated still. Please clarify if this has been looked at by her GI doctor. Thanks.

## 2015-11-21 NOTE — Telephone Encounter (Signed)
Advised patient of lab results. Patient reports that she had this checked by Dr. Tiffany Kocher and she reports that he said to leave everything "as is". She reports that he is aware of her liver enzymes.

## 2015-12-08 ENCOUNTER — Other Ambulatory Visit: Payer: Self-pay | Admitting: Neurology

## 2015-12-08 DIAGNOSIS — G3184 Mild cognitive impairment, so stated: Secondary | ICD-10-CM | POA: Diagnosis not present

## 2015-12-08 DIAGNOSIS — R413 Other amnesia: Secondary | ICD-10-CM

## 2015-12-08 DIAGNOSIS — I6523 Occlusion and stenosis of bilateral carotid arteries: Secondary | ICD-10-CM | POA: Diagnosis not present

## 2015-12-12 ENCOUNTER — Ambulatory Visit (INDEPENDENT_AMBULATORY_CARE_PROVIDER_SITE_OTHER): Payer: 59 | Admitting: Licensed Clinical Social Worker

## 2015-12-12 DIAGNOSIS — F3181 Bipolar II disorder: Secondary | ICD-10-CM | POA: Diagnosis not present

## 2015-12-12 NOTE — Progress Notes (Signed)
Patient:   Margaret Romero   DOB:   1946/07/11  MR Number:  [id number]  Location:  [facility] [address] [place] [address] Dept: [phone]           Date of Service:   12/12/2015  Start Time:   3p End Time:   4p  Provider/Observer:  Lubertha South Counselor       Billing Code/Service: [id number]  Behavioral Observation: Margaret Romero  presents as a 70 y.o.-year-old Caucasian Female who appeared her stated age. her dress was Appropriate and she was Casual and her manners were Appropriate to the situation.  There were not any physical disabilities noted.  she displayed an appropriate level of cooperation and motivation.    Interactions:    Active   Attention:   within normal limits  Memory:   within normal limits  Speech (Volume):  normal  Speech:   normal pitch and normal volume  Thought Process:  Coherent and Relevant  Though Content:  WNL  Orientation:   person, place, time/date and situation  Judgment:   Fair  Planning:   Fair  Affect:    Appropriate  Mood:    Anxious  Insight:   Fair  Intelligence:   normal  Chief Complaint:     Chief Complaint  Patient presents with  . Other  . 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   Patient is the 2nd child.  Good relationship with her living siblings.  Raised by her mother.  Father was in the [employer] Education officer, community)   Children/Grand-children:    Collie Siad 89  Natural/Informal Support:                           Flonnie [phone]   Substance Use:  No concerns of substance abuse are reported.  Last cigarette was in 1975.  Last beer was in the late 1980s   Medical History:   Past Medical History  Diagnosis Date  . Heart disease   . Hypertension   . Bipolar disorder ([place])   . 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 GERD (gastroesophageal reflux disease)   . Malignant neoplasm of thyroid gland [facility]) January 23, 2011    medullary carcinoma thyroid T2, Nx.  . IBS (irritable bowel syndrome) 2000          Medication List       This list is accurate as of: 12/12/15  3:14 PM.  Always use your most recent med list.  aspirin 81 MG tablet  Take 81 mg by mouth daily.     escitalopram 10 MG tablet  Commonly known as:  LEXAPRO  TAKE ONE TABLET BY MOUTH EVERY DAY     isosorbide mononitrate 30 MG 24 hr tablet  Commonly known as:  IMDUR  TAKE ONE TABLET BY MOUTH EVERY DAY     levothyroxine 75 MCG tablet  Commonly known as:  SYNTHROID, LEVOTHROID  TAKE ONE TABLET BY MOUTH EVERY DAY     losartan 50 MG tablet  Commonly known as:  COZAAR  Take 1 tablet by mouth daily.     mesalamine 1.2 g EC tablet  Commonly known as:  LIALDA  Take 2 tablets (2.4 g total) by mouth daily with breakfast.     multivitamin with minerals tablet  Take 1 tablet by mouth daily.     NITROSTAT 0.4 MG SL tablet  Generic drug:  nitroGLYCERIN  Place under the tongue.     pantoprazole 40 MG tablet  Commonly known as:  PROTONIX  TAKE ONE TABLET BY MOUTH EVERY DAY     simvastatin 40 MG tablet  Commonly known as:  ZOCOR  Take 1 tablet by mouth daily.              Sexual History:   History  Sexual Activity  . Sexual Activity: Not  on file     Abuse/Trauma History: Denies   Psychiatric History:  Attended Dr. Bridgett Larsson once about 7 years ago   Strengths:   [place], "[place]", garden, clip shrubs, can fix lawnmower/weed eater   Recovery Goals:  "Give me some pills for my brain."  Hobbies/Interests:               crafty   Challenges/Barriers: Attitude, behavior     Family Med/Psych History:  Family History  Problem Relation Age of Onset  . Cancer Sister     Bone  . Cancer Brother     Bone  . Cancer Sister     Breast  . Cancer Sister     Colon     Risk of Suicide/Violence: low   History of Suicide/Violence:  Denies  Psychosis:   Denies   Diagnosis:    Bipolar 2 disorder ([place])   Recommendation/Plan: Writer recommends Outpatient Therapy at least twice monthly to include but not limited to individual, group and or family therapy.  Medication Management is also recommended to assist with her mood.

## 2015-12-20 ENCOUNTER — Encounter: Payer: Self-pay | Admitting: *Deleted

## 2015-12-20 ENCOUNTER — Other Ambulatory Visit: Payer: Self-pay | Admitting: *Deleted

## 2015-12-20 DIAGNOSIS — Z85850 Personal history of malignant neoplasm of thyroid: Secondary | ICD-10-CM

## 2015-12-29 ENCOUNTER — Ambulatory Visit
Admission: RE | Admit: 2015-12-29 | Discharge: 2015-12-29 | Disposition: A | Discharge status: Home / Self Care | Payer: Medicare Other | Source: Ambulatory Visit | Attending: Neurology | Admitting: Neurology

## 2015-12-29 DIAGNOSIS — R9082 White matter disease, unspecified: Secondary | ICD-10-CM | POA: Diagnosis not present

## 2015-12-29 DIAGNOSIS — Z8673 Personal history of transient ischemic attack (TIA), and cerebral infarction without residual deficits: Secondary | ICD-10-CM | POA: Insufficient documentation

## 2015-12-29 DIAGNOSIS — G3184 Mild cognitive impairment, so stated: Secondary | ICD-10-CM | POA: Diagnosis not present

## 2015-12-29 DIAGNOSIS — G319 Degenerative disease of nervous system, unspecified: Secondary | ICD-10-CM | POA: Insufficient documentation

## 2015-12-29 DIAGNOSIS — D329 Benign neoplasm of meninges, unspecified: Secondary | ICD-10-CM | POA: Diagnosis not present

## 2015-12-29 DIAGNOSIS — R413 Other amnesia: Secondary | ICD-10-CM

## 2016-01-22 ENCOUNTER — Ambulatory Visit (INDEPENDENT_AMBULATORY_CARE_PROVIDER_SITE_OTHER): Payer: 59 | Admitting: Psychiatry

## 2016-01-22 ENCOUNTER — Encounter: Payer: Self-pay | Admitting: Psychiatry

## 2016-01-22 VITALS — BP 122/60 | HR 65 | Temp 97.5°F | Ht 62.5 in | Wt 138.0 lb

## 2016-01-22 DIAGNOSIS — F319 Bipolar disorder, unspecified: Secondary | ICD-10-CM | POA: Insufficient documentation

## 2016-01-22 DIAGNOSIS — F331 Major depressive disorder, recurrent, moderate: Secondary | ICD-10-CM

## 2016-01-22 DIAGNOSIS — F419 Anxiety disorder, unspecified: Secondary | ICD-10-CM | POA: Diagnosis not present

## 2016-01-22 DIAGNOSIS — F0280 Dementia in other diseases classified elsewhere without behavioral disturbance: Secondary | ICD-10-CM

## 2016-01-22 MED ORDER — LAMOTRIGINE 25 MG PO TABS: 25.0000 mg | ORAL_TABLET | Freq: Every day | ORAL | Status: DC

## 2016-01-22 MED ORDER — ESCITALOPRAM OXALATE 5 MG PO TABS: 5.0000 mg | ORAL_TABLET | Freq: Every day | ORAL | Status: DC

## 2016-01-22 NOTE — Progress Notes (Signed)
Psychiatric Initial Adult Assessment   Patient Identification: Margaret Romero MRN:  [id number] Date of Evaluation:  01/22/2016 Referral Source: PCP Chief Complaint:   Chief Complaint    Establish Care; Other; Medication Problem     Visit Diagnosis:    ICD-9-CM ICD-10-CM   1. 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 Moderate episode of recurrent major depressive disorder (HCC) 296.32 F33.1    Diagnosis:   Patient Active Problem List   Diagnosis Date Noted  . Bipolar I disorder ([place]) [F31.9] 01/22/2016  . Pancytopenia ([place]) [P79.480] 11/20/2015  . Skin lesion [L98.9] 11/20/2015  . Bipolar 2 disorder ([place]) [F31.81] 11/16/2015  . Body mass index (BMI) of 22.0-22.9 in adult [facility] 11/16/2015  . Arteriosclerosis of coronary artery [I25.10] 11/16/2015  . Cancer of thyroid ([place]) [C73] 11/16/2015  . Colitis [K52.9] 11/16/2015  . Acute cystitis [N30.00] 11/16/2015  . Clinical depression [F32.9] 11/16/2015  . 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 Presence of stent in coronary artery [Z95.5] 11/16/2015  . HLD (hyperlipidemia) [E78.5] 11/16/2015  . Adaptive colitis [K59.8] 11/16/2015  . Bad memory [R41.3] 11/16/2015  . 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 Carotid artery narrowing [I65.29] 11/16/2015  . Thyroid nodule [E04.1] 11/16/2015  . Carpal bone fracture [S62.109A] 11/16/2015  . Acid reflux [K21.9] 08/14/2015  . Anxiety [F41.9] 08/14/2015  . Hypertension [I10] 06/13/2015  . Ulcerative colitis ([place]) [K51.90] 06/01/2015  . Absolute anemia [D64.9] 12/15/2014  . Fecal occult blood test positive [R19.5] 12/15/2014  . SOB (shortness of breath) on exertion [R06.02] 11/01/2014  . Hypoxemia [R09.02] 11/01/2014  . Breath shortness [R06.02] 11/01/2014  . BP (high blood pressure) [I10] 02/24/2014  . Personal history of malignant neoplasm of thyroid [Z85.850] 01/13/2014   History of Present Illness:   Patient is a 70 year old female who presented for initial assessment accompanied by her friend. She currently lives with her. She reported that she was referred by her neurologist Dr. Manuella Ghazi as well as by her primary care physician. Patient reported that she does not know exactly what is going on with her but she feels that she has some issues with her mind. Most of the history was obtained from the patient as well as interview of her friend. Patient reported that she feels that she has problems remembering names as well as anxiety and depression. Her friend reported that patient has long history of bipolar disorder and she will lose temper quickly. She has a daughter who lives close by and has 5 grandchildren. Her friend reported that patient will yell at them often. She stated that she was started on Lexapro by her primary care physician but after taking the medication she has been becoming more agitated. Patient was also started on dementia due to mild cognitive decline by Dr. Manuella Ghazi neurologist who has recently evaluated her. 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 Her friend reported that she does not have any problem driving at this time. Her friend was insistent that patient needs to work as it will help her relax and she is doing well at her work. Patient currently denied having any suicidal homicidal ideations or plans. She reported that she wants to have her medications adjusted at this time.   Elements:  Severity:  mild. Associated Signs/Symptoms: Depression Symptoms:  depressed mood, fatigue, impaired memory, (Hypo) Manic Symptoms:  Distractibility, Impulsivity, Anxiety Symptoms:  none Psychotic Symptoms:  none PTSD Symptoms: Negative NA  Past Medical History:  Past Medical History  Diagnosis Date  . Heart disease   . Hypertension   . Bipolar disorder ([place])   . Occlusion and stenosis of carotid artery without  mention of cerebral infarction   . GERD (gastroesophageal reflux disease)   . Malignant neoplasm of thyroid gland [facility]) January 23, 2011    medullary carcinoma thyroid T2, Nx.  . IBS (irritable bowel syndrome) 2000    Past Surgical History  Procedure Laterality Date  . Hernia repair  2007  . Coronary stent placement  2007  . Cholecystectomy  2006  . Thyroidectomy  2012  . Colonoscopy  2008  . Nasal sinus surgery  2008  . Carotid endarterectomy Right November 2015    [name], MD   Family History:  Family History  Problem Relation Age of Onset  . Cancer Sister     Bone  . Cancer Brother     Bone  . Cancer Sister     Breast  . Cancer Sister     Colon    Social History:   Social History   Social History  . Marital Status: Single    Spouse Name: N/A  . Number of Children: N/A  . Years of Education: N/A   Social History Main Topics  . Smoking status: Former Smoker -- 1.00 packs/day for 10 years    Quit date: 12/01/1974  . Smokeless tobacco: Never Used  . Alcohol Use: No  . Drug Use: No  . Sexual Activity: Not Currently   Other Topics Concern  . None   Social History Narrative   Additional Social History:  Patient is currently divorced. She has a daughter and 5 grandchildren. [name].  Musculoskeletal: Strength & Muscle Tone: within normal limits Gait & Station: normal Patient leans: N/A  Psychiatric Specialty Exam: HPI  ROS  Blood pressure 122/60, pulse 65, temperature 97.5 F (36.4 C), temperature source Tympanic, height 5' 2.5" (1.588 m), weight 138 lb (62.596 kg), SpO2 95 %.Body mass index is 24.82 kg/(m^2).  General Appearance: Casual  Eye Contact:  Fair  Speech:  Slow  Volume:  Normal  Mood:  Anxious  Affect:  Constricted  Thought Process:  Disorganized  Orientation:  Full (Time, Place, and Person)  Thought Content:  WDL  Suicidal Thoughts:  No  Homicidal Thoughts:  No  Memory:  impaired   Judgement:  Poor  Insight:  Lacking  Psychomotor Activity:  Decreased  Concentration:  Poor  Recall:  Poor  Fund of Knowledge:Fair  Language: Fair  Akathisia:  No  Handed:  Right  AIMS (if indicated):    Assets:  Communication Skills Housing Social Support  ADL's:  Intact  Cognition: WNL  Sleep:       Allergies:  No Known Allergies Current Medications: Current Outpatient Prescriptions  Medication Sig Dispense Refill  . aspirin 81 MG tablet Take 81 mg by mouth daily.    Marland Kitchen donepezil (ARICEPT) 10 MG tablet Take by mouth.    . escitalopram (LEXAPRO) 10 MG tablet TAKE ONE TABLET BY MOUTH EVERY DAY 90 tablet 0  . isosorbide mononitrate (IMDUR) 30 MG 24 hr tablet TAKE ONE TABLET BY MOUTH EVERY DAY 90 tablet 3  . levothyroxine (SYNTHROID, LEVOTHROID) 75 MCG tablet TAKE ONE TABLET BY MOUTH EVERY DAY 30 tablet 10  . losartan (COZAAR) 50 MG tablet Take 1 tablet by mouth daily.    . mesalamine (  LIALDA) 1.2 G EC tablet Take 2 tablets (2.4 g total) by mouth daily with breakfast. 60 tablet 3  . Multiple Vitamins-Minerals (MULTIVITAMIN WITH MINERALS) tablet Take 1 tablet by mouth daily.    . nitroGLYCERIN (NITROSTAT) 0.4 MG SL tablet Place under the tongue.    . pantoprazole (PROTONIX) 40 MG tablet TAKE ONE TABLET BY MOUTH EVERY DAY 90 tablet 0  . simvastatin (ZOCOR) 40 MG tablet Take 1 tablet by mouth daily.     No current facility-administered medications for this visit.    Previous Psychotropic Medications: Patient is currently taking Lexapro 10 mg daily. She has never tried a mood stabilizer in the past. She was recently started on Aricept by Dr. Manuella Ghazi neurologist. She is reporting some diarrhea as the adverse effects. She does not have any previous history of psychiatric hospitalization.  Substance Abuse History in the last 12 months:  No.  Consequences of Substance Abuse: Negative NA  Medical Decision Making:  Review of Psycho-Social Stressors (1) and Review and summation of old  records (2)  Treatment Plan Summary: Medication management   Discussed with patient and her friend about the medications treatment risk benefits and alternatives I will start her on lamotrigine 25 mg by mouth daily. Advised them  about the side effects including risk of rash and Kathreen Cosier syndrome and she demonstrated understanding She will continue on Lexapro 5 mg by mouth daily She will also continue on Aricept 10 mg daily Follow-up in 1 month   More than 50% of the time spent in psychoeducation, counseling and coordination of care.    This note was generated in part or whole with voice recognition software. Voice regonition is usually quite accurate but there are transcription errors that can and very often do occur. I apologize for any typographical errors that were not detected and corrected.     Rainey Pines, MD  2/20/20172:11 PM

## 2016-02-08 DIAGNOSIS — D485 Neoplasm of uncertain behavior of skin: Secondary | ICD-10-CM | POA: Diagnosis not present

## 2016-02-08 DIAGNOSIS — D0439 Carcinoma in situ of skin of other parts of face: Secondary | ICD-10-CM | POA: Diagnosis not present

## 2016-02-19 ENCOUNTER — Ambulatory Visit (INDEPENDENT_AMBULATORY_CARE_PROVIDER_SITE_OTHER): Payer: 59 | Admitting: Psychiatry

## 2016-02-19 ENCOUNTER — Encounter: Payer: Self-pay | Admitting: Psychiatry

## 2016-02-19 VITALS — BP 138/78 | HR 72 | Temp 97.8°F | Ht 62.5 in | Wt 137.6 lb

## 2016-02-19 DIAGNOSIS — F0280 Dementia in other diseases classified elsewhere without behavioral disturbance: Secondary | ICD-10-CM | POA: Diagnosis not present

## 2016-02-19 MED ORDER — LAMOTRIGINE 25 MG PO TABS: 50.0000 mg | ORAL_TABLET | Freq: Every day | ORAL | Status: DC

## 2016-02-19 MED ORDER — DONEPEZIL HCL 10 MG PO TABS: 10.0000 mg | ORAL_TABLET | Freq: Every day | ORAL | Status: DC

## 2016-02-19 NOTE — Progress Notes (Signed)
Psychiatric MD Progress Note  Patient Identification: Margaret Romero MRN:  [id number] Date of Evaluation:  02/19/2016 Referral Source: PCP Chief Complaint:   Chief Complaint    Follow-up; Medication Refill     Visit Diagnosis:    ICD-9-CM ICD-10-CM   1. 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 Bipolar I disorder ([place]) [F31.9] 01/22/2016  . Pancytopenia ([place]) [N82.956] 11/20/2015  . Skin lesion [L98.9] 11/20/2015  . Bipolar 2 disorder ([place]) [F31.81] 11/16/2015  . Body mass index (BMI) of 22.0-22.9 in adult [facility] 11/16/2015  . Arteriosclerosis of coronary artery [I25.10] 11/16/2015  . Cancer of thyroid ([place]) [C73] 11/16/2015  . Colitis [K52.9] 11/16/2015  . Acute cystitis [N30.00] 11/16/2015  . Clinical depression [F32.9] 11/16/2015  . 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 Presence of stent in coronary artery [Z95.5] 11/16/2015  . HLD (hyperlipidemia) [E78.5] 11/16/2015  . Adaptive colitis [K59.8] 11/16/2015  . Bad memory [R41.3] 11/16/2015  . 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 Carotid artery narrowing [I65.29] 11/16/2015  . Thyroid nodule [E04.1] 11/16/2015  . Carpal bone fracture [S62.109A] 11/16/2015  . Acid reflux [K21.9] 08/14/2015  . Anxiety [F41.9] 08/14/2015  . Hypertension [I10] 06/13/2015  . Ulcerative colitis ([place]) [K51.90] 06/01/2015  . Absolute anemia [D64.9] 12/15/2014  . Fecal occult blood test positive [R19.5] 12/15/2014  . SOB (shortness of breath) on exertion [R06.02] 11/01/2014  . Hypoxemia [R09.02] 11/01/2014  . Breath shortness [R06.02] 11/01/2014  . BP (high blood pressure) [I10] 02/24/2014  . Personal history of malignant neoplasm of thyroid [Z85.850] 01/13/2014   History of Present Illness:  Patient is a 70 year old female who presented for Follow-up accompanied by her friend. Patient reported that  she has started noticing improvement in her symptoms since her medications were adjusted. Her friend also noted that she is becoming less agitated since the Lexapro dose was decreased. Her memory is improving as she has been taking Aricept on a regular basis. Her friends reported that she is responding well to the lamotrigine. Her friend was very receptive of the medication changes and reported that she is able to calm down more quickly. Patient reported that she is feeling that her anger and agitation is under control now and she is receptive to having her medication dose increased at this time. Patient does not yell often at them. She is able to sleep well at night. She currently denied having any side effects of the medication as she has started taking Aricept with food at night..  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 Her friend reported that she does not have any problem driving at this time. Her friend was insistent that patient needs to work as it will help her relax and she is doing well at her work. Patient currently denied having any suicidal homicidal ideations or plans. She reported that she wants to have her medications adjusted at this time.   Elements:  Severity:  mild. Associated Signs/Symptoms: Depression Symptoms:  depressed mood, fatigue, impaired memory, (Hypo) Manic Symptoms:  Distractibility, Impulsivity, Anxiety Symptoms:  none Psychotic Symptoms:  none PTSD Symptoms: Negative NA  Past Medical History:  Past Medical History  Diagnosis Date  . Heart disease   . Hypertension   . Bipolar disorder ([place])   . Occlusion and stenosis of carotid artery without  mention of cerebral infarction   . GERD (gastroesophageal reflux disease)   . Malignant neoplasm of thyroid gland [facility]) January 23, 2011    medullary carcinoma thyroid T2, Nx.  . IBS (irritable bowel syndrome) 2000    Past Surgical History   Procedure Laterality Date  . Hernia repair  2007  . Coronary stent placement  2007  . Cholecystectomy  2006  . Thyroidectomy  2012  . Colonoscopy  2008  . Nasal sinus surgery  2008  . Carotid endarterectomy Right November 2015    [name], MD   Family History:  Family History  Problem Relation Age of Onset  . Cancer Sister     Bone  . Cancer Brother     Bone  . Cancer Sister     Breast  . Cancer Sister     Colon    Social History:   Social History   Social History  . Marital Status: Single    Spouse Name: N/A  . Number of Children: N/A  . Years of Education: N/A   Social History Main Topics  . Smoking status: Former Smoker -- 1.00 packs/day for 10 years    Quit date: 12/01/1974  . Smokeless tobacco: Never Used  . Alcohol Use: No  . Drug Use: No  . Sexual Activity: Not Currently   Other Topics Concern  . None   Social History Narrative   Additional Social History:  Patient is currently divorced. She has a daughter and 5 grandchildren. [name].  Musculoskeletal: Strength & Muscle Tone: within normal limits Gait & Station: normal Patient leans: N/A  Psychiatric Specialty Exam: HPI   ROS   Blood pressure 138/78, pulse 72, temperature 97.8 F (36.6 C), temperature source Tympanic, height 5' 2.5" (1.588 m), weight 137 lb 9.6 oz (62.415 kg), SpO2 96 %.Body mass index is 24.75 kg/(m^2).  General Appearance: Casual  Eye Contact:  Fair  Speech:  Slow  Volume:  Normal  Mood:  Anxious  Affect:  Constricted  Thought Process:  Disorganized  Orientation:  Full (Time, Place, and Person)  Thought Content:  WDL  Suicidal Thoughts:  No  Homicidal Thoughts:  No  Memory:  impaired  Judgement:  Poor  Insight:  Lacking  Psychomotor Activity:  Decreased  Concentration:  Poor  Recall:  Poor  Fund of Knowledge:Fair  Language: Fair  Akathisia:  No  Handed:  Right  AIMS (if indicated):    Assets:  Communication  Skills Housing Social Support  ADL's:  Intact  Cognition: WNL  Sleep:       Allergies:  No Known Allergies Current Medications: Current Outpatient Prescriptions  Medication Sig Dispense Refill  . aspirin 81 MG tablet Take 81 mg by mouth daily.    Marland Kitchen donepezil (ARICEPT) 10 MG tablet Take by mouth.    . escitalopram (LEXAPRO) 5 MG tablet Take 1 tablet (5 mg total) by mouth daily. 90 tablet 1  . isosorbide mononitrate (IMDUR) 30 MG 24 hr tablet TAKE ONE TABLET BY MOUTH EVERY DAY 90 tablet 3  . lamoTRIgine (LAMICTAL) 25 MG tablet Take 1 tablet (25 mg total) by mouth daily. 30 tablet 1  . levothyroxine (SYNTHROID, LEVOTHROID) 75 MCG tablet TAKE ONE TABLET BY MOUTH EVERY DAY 30 tablet 10  . losartan (COZAAR) 50 MG tablet Take 1 tablet by mouth daily.    . mesalamine (LIALDA) 1.2 G EC tablet Take 2 tablets (2.4 g total) by mouth daily with  breakfast. 60 tablet 3  . Multiple Vitamins-Minerals (MULTIVITAMIN WITH MINERALS) tablet Take 1 tablet by mouth daily.    . nitroGLYCERIN (NITROSTAT) 0.4 MG SL tablet Place under the tongue.    . pantoprazole (PROTONIX) 40 MG tablet TAKE ONE TABLET BY MOUTH EVERY DAY 90 tablet 0  . simvastatin (ZOCOR) 40 MG tablet Take 1 tablet by mouth daily.     No current facility-administered medications for this visit.    Previous Psychotropic Medications: Patient is currently taking Lexapro 10 mg daily. She has never tried a mood stabilizer in the past. She was recently started on Aricept by Dr. Manuella Ghazi neurologist. She is reporting some diarrhea as the adverse effects. She does not have any previous history of psychiatric hospitalization.  Substance Abuse History in the last 12 months:  No.  Consequences of Substance Abuse: Negative NA  Medical Decision Making:  Review of Psycho-Social Stressors (1) and Review and summation of old records (2)  Treatment Plan Summary: Medication management   Discussed with patient and her friend about the medications treatment  risk benefits and alternatives I will start her on lamotrigine 50 mg by mouth daily. Advised them  about the side effects including risk of rash and Kathreen Cosier syndrome and she demonstrated understanding She will discontinue Lexapro 5 mg. She will also continue on Aricept 10 mg daily Follow-up in 1 month   More than 50% of the time spent in psychoeducation, counseling and coordination of care.    This note was generated in part or whole with voice recognition software. Voice regonition is usually quite accurate but there are transcription errors that can and very often do occur. I apologize for any typographical errors that were not detected and corrected.     Rainey Pines, MD  3/20/20171:28 PM

## 2016-02-22 DIAGNOSIS — Z85850 Personal history of malignant neoplasm of thyroid: Secondary | ICD-10-CM | POA: Diagnosis not present

## 2016-02-23 LAB — CEA: CEA: 9.2 ng/mL — AB (ref 0.0–4.7)

## 2016-02-23 LAB — CALCITONIN: CALCITONIN: 69.4 pg/mL — AB (ref 0.0–5.0)

## 2016-02-28 ENCOUNTER — Encounter: Payer: Self-pay | Admitting: General Surgery

## 2016-02-28 ENCOUNTER — Ambulatory Visit (INDEPENDENT_AMBULATORY_CARE_PROVIDER_SITE_OTHER): Payer: Medicare Other | Admitting: General Surgery

## 2016-02-28 VITALS — BP 162/70 | HR 76 | Resp 12 | Ht 63.0 in | Wt 139.0 lb

## 2016-02-28 DIAGNOSIS — Z85850 Personal history of malignant neoplasm of thyroid: Secondary | ICD-10-CM | POA: Diagnosis not present

## 2016-02-28 NOTE — Progress Notes (Addendum)
Patient ID: Margaret Romero, female   DOB: 1946/08/02, 70 y.o.   MRN: [id number]  Chief Complaint  Patient presents with  . Follow-up    HPI Margaret Romero is a 70 y.o. female.  here today to follow up from her thyroid cancer. Patient feeling well, denies any dietary or bowel issues. Denies trouble swallowing. Herfriend/caregiver [name] is present for today. She does have some memory issues at times. Recently diagnosed with MCI followed by Dr. Manuella Ghazi.  HPI  Past Medical History  Diagnosis Date  . Heart disease   . Hypertension   . Bipolar disorder ([place])   . 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 GERD (gastroesophageal reflux disease)   . Malignant neoplasm of thyroid gland [facility]) January 23, 2011    medullary carcinoma thyroid T2, Nx.  . IBS (irritable bowel syndrome) 2000  . MCI (mild cognitive impairment) 2017    Past Surgical History  Procedure Laterality Date  . Hernia repair  2007  . Coronary stent placement  2007  . Cholecystectomy  2006  . Thyroidectomy  2012  . Colonoscopy  2008  . Nasal sinus surgery  2008  . Carotid endarterectomy Right November 2015    [name], MD    Family History  Problem Relation Age of Onset  . Cancer Sister     Bone  . Cancer Brother     Bone  . Cancer Sister     Breast  . Cancer Sister     Colon     Social History Social History  Substance Use Topics  . Smoking status: Former Smoker -- 1.00 packs/day for 10 years    Quit date: 12/01/1974  . Smokeless tobacco: Never Used  . Alcohol Use: No    No Known Allergies  Current Outpatient Prescriptions  Medication Sig Dispense Refill  . aspirin 81 MG tablet Take 81 mg by mouth daily.    Marland Kitchen donepezil (ARICEPT) 10 MG tablet Take 1 tablet (10 mg total) by mouth at bedtime. 30 tablet 1  . isosorbide mononitrate (IMDUR) 30 MG 24 hr tablet TAKE ONE TABLET BY MOUTH EVERY DAY 90 tablet 3  . lamoTRIgine (LAMICTAL) 25 MG tablet Take  2 tablets (50 mg total) by mouth daily. 60 tablet 1  . levothyroxine (SYNTHROID, LEVOTHROID) 75 MCG tablet TAKE ONE TABLET BY MOUTH EVERY DAY 30 tablet 10  . losartan (COZAAR) 50 MG tablet Take 1 tablet by mouth daily.    . mesalamine (LIALDA) 1.2 G EC tablet Take 2 tablets (2.4 g total) by mouth daily with breakfast. 60 tablet 3  . Multiple Vitamins-Minerals (MULTIVITAMIN WITH MINERALS) tablet Take 1 tablet by mouth daily.    . nitroGLYCERIN (NITROSTAT) 0.4 MG SL tablet Place under the tongue.    . pantoprazole (PROTONIX) 40 MG tablet TAKE ONE TABLET BY MOUTH EVERY DAY 90 tablet 0  . simvastatin (ZOCOR) 40 MG tablet Take 1 tablet by mouth daily.     No current facility-administered medications for this visit.    Review of Systems Review of Systems  Constitutional: Negative.   Respiratory: Negative.   Cardiovascular: Negative.     Blood pressure 162/70, pulse 76, resp. rate 12, height 5' 3"  (1.6 m), weight 139 lb (63.05 kg).  Physical Exam Physical Exam  Constitutional: She is oriented to person, place, and time. She appears well-developed and well-nourished.  HENT:  Mouth/Throat: Oropharynx is clear and moist.  Eyes: Conjunctivae are normal. No scleral  icterus.  Neck: Neck supple.    Cardiovascular: Normal rate, regular rhythm and normal heart sounds.   Pulses:      Carotid pulses are 2+ on the right side, and 2+ on the left side. Pulmonary/Chest: Effort normal and breath sounds normal.  Lymphadenopathy:    She has no cervical adenopathy.  Neurological: She is alert and oriented to person, place, and time.  Skin: Skin is warm and dry.  Psychiatric: Her behavior is normal.    Data Reviewed  CEA and serum calcitonin continue to rise but remained below threshold for blind neck dissection.  Assessment    History of  medullary carcinoma of the thyroid, rising serum calcitonin. No clinical evidence of palpable  Abnormality within the neck.    Plan     Return in 6  months. Get a CEA and Calcitonin before visit.   The patient is under neurology care as well  In regards to her cognitive impairment that became pronounced after her carotid endarterectomy. Review the past medical records showed that a diagnosis. Colitis was present, but no records are in the hospital database suggesting a colonoscopy since 2005. We'll contact the patient's friend determine if this study has been completed at an outside facility.    PCP:  Margarita Rana This information has been scribed by Karie Fetch RN, BSN,BC.   [name] 03/05/2016, 12:39 PM

## 2016-02-28 NOTE — Patient Instructions (Addendum)
The patient is aware to call back for any questions or concerns. Return in 6 months. Get a CEA and Calcitonin before visit.

## 2016-02-29 DIAGNOSIS — E785 Hyperlipidemia, unspecified: Secondary | ICD-10-CM | POA: Diagnosis not present

## 2016-02-29 DIAGNOSIS — I2510 Atherosclerotic heart disease of native coronary artery without angina pectoris: Secondary | ICD-10-CM | POA: Diagnosis not present

## 2016-02-29 DIAGNOSIS — I6523 Occlusion and stenosis of bilateral carotid arteries: Secondary | ICD-10-CM | POA: Diagnosis not present

## 2016-02-29 DIAGNOSIS — I10 Essential (primary) hypertension: Secondary | ICD-10-CM | POA: Diagnosis not present

## 2016-03-05 DIAGNOSIS — I10 Essential (primary) hypertension: Secondary | ICD-10-CM | POA: Diagnosis not present

## 2016-03-05 DIAGNOSIS — I2510 Atherosclerotic heart disease of native coronary artery without angina pectoris: Secondary | ICD-10-CM | POA: Diagnosis not present

## 2016-03-05 DIAGNOSIS — R0602 Shortness of breath: Secondary | ICD-10-CM | POA: Diagnosis not present

## 2016-03-05 DIAGNOSIS — Z9861 Coronary angioplasty status: Secondary | ICD-10-CM | POA: Diagnosis not present

## 2016-03-05 DIAGNOSIS — E7800 Pure hypercholesterolemia, unspecified: Secondary | ICD-10-CM | POA: Diagnosis not present

## 2016-03-11 ENCOUNTER — Other Ambulatory Visit: Payer: Self-pay | Admitting: Family Medicine

## 2016-03-11 DIAGNOSIS — K219 Gastro-esophageal reflux disease without esophagitis: Secondary | ICD-10-CM

## 2016-03-20 ENCOUNTER — Ambulatory Visit (INDEPENDENT_AMBULATORY_CARE_PROVIDER_SITE_OTHER): Payer: 59 | Admitting: Psychiatry

## 2016-03-20 ENCOUNTER — Encounter: Payer: Self-pay | Admitting: Psychiatry

## 2016-03-20 VITALS — BP 122/68 | HR 71 | Temp 97.9°F | Ht 63.0 in | Wt 137.6 lb

## 2016-03-20 DIAGNOSIS — F331 Major depressive disorder, recurrent, moderate: Secondary | ICD-10-CM | POA: Diagnosis not present

## 2016-03-20 DIAGNOSIS — F0280 Dementia in other diseases classified elsewhere without behavioral disturbance: Secondary | ICD-10-CM | POA: Diagnosis not present

## 2016-03-20 MED ORDER — DONEPEZIL HCL 10 MG PO TABS: 10.0000 mg | ORAL_TABLET | Freq: Every day | ORAL | Status: DC

## 2016-03-20 MED ORDER — LAMOTRIGINE 25 MG PO TABS: 75.0000 mg | ORAL_TABLET | Freq: Every day | ORAL | Status: DC

## 2016-03-20 MED ORDER — ESCITALOPRAM OXALATE 10 MG PO TABS: 10.0000 mg | ORAL_TABLET | Freq: Every day | ORAL | Status: DC

## 2016-03-20 NOTE — Progress Notes (Signed)
Psychiatric MD Progress Note  Patient Identification: Margaret Romero MRN:  [id number] Date of Evaluation:  03/20/2016 Referral Source: PCP Chief Complaint:   Chief Complaint    Follow-up; Medication Refill     Visit Diagnosis:    ICD-9-CM ICD-10-CM   1. 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 Moderate episode of recurrent major depressive disorder (HCC) 296.32 F33.1    Diagnosis:   Patient Active Problem List   Diagnosis Date Noted  . Bipolar I disorder ([place]) [F31.9] 01/22/2016  . Bipolar affective disorder ([place]) [F31.9] 01/22/2016  . Pancytopenia ([place]) [X79.390] 11/20/2015  . Skin lesion [L98.9] 11/20/2015  . Disease of skin and subcutaneous tissue [L98.9] 11/20/2015  . Bipolar 2 disorder ([place]) [F31.81] 11/16/2015  . Body mass index (BMI) of 22.0-22.9 in adult [facility] 11/16/2015  . Arteriosclerosis of coronary artery [I25.10] 11/16/2015  . Cancer of thyroid ([place]) [C73] 11/16/2015  . Colitis [K52.9] 11/16/2015  . Acute cystitis [N30.00] 11/16/2015  . Clinical depression [F32.9] 11/16/2015  . 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 Presence of stent in coronary artery [Z95.5] 11/16/2015  . HLD (hyperlipidemia) [E78.5] 11/16/2015  . Adaptive colitis [K59.8] 11/16/2015  . Bad memory [R41.3] 11/16/2015  . 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 Carotid artery narrowing [I65.29] 11/16/2015  . Carpal bone fracture [S62.109A] 11/16/2015  . 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 CAD in native artery [I25.10] 11/16/2015  . Bipolar II disorder ([place]) [F31.81] 11/16/2015  . Closed fracture carpal bone [S62.109A] 11/16/2015  . Diverticulitis [K57.92] 11/16/2015  . Major depressive disorder with single episode ([place]) [F32.9] 11/16/2015  . Malignant neoplasm of thyroid gland ([place]) [C73] 11/16/2015  . Gastroenteritis, non-infectious [K52.9] 11/16/2015  . Carotid artery obstruction  [I65.29] 11/16/2015  . Amnesia [R41.3] 11/16/2015  . Functional bowel disorder [K59.9] 11/16/2015  . History of cardiovascular surgery [Z98.890] 11/16/2015  . Acid reflux [K21.9] 08/14/2015  . Anxiety [F41.9] 08/14/2015  . Anxiety disorder [F41.9] 08/14/2015  . Hypertension [I10] 06/13/2015  . Essential (primary) hypertension [I10] 06/13/2015  . Ulcerative colitis ([place]) [K51.90] 06/01/2015  . Absolute anemia [D64.9] 12/15/2014  . Fecal occult blood test positive [R19.5] 12/15/2014  . Abnormal feces [R19.5] 12/15/2014  . SOB (shortness of breath) on exertion [R06.02] 11/01/2014  . Hypoxemia [R09.02] 11/01/2014  . Breath shortness [R06.02] 11/01/2014  . BP (high blood pressure) [I10] 02/24/2014  . Personal history of malignant neoplasm of thyroid [Z85.850] 01/13/2014   History of Present Illness:  Patient is a 70 year old female who presented for Follow-up accompanied by her friend. And daughter. Her friend Flonnie, has written a letter about the patient indicating that she has been getting worse for the past 1 month. They have reported that patient was doing well on Lexapro 5 mg although it was making her confused. She was more calm and alert and was responding well to the medication. Since the Lexapro has been discontinued she is becoming very defiant and short tempered. She is refusing to listen to any criticism and suggestions. She is also becoming very negative and demanding.   During the interview patient was noted to be agitated and short tempered. She was giving very short answers 2 of the questions. She reported that she is doing well. However she appeared ill and short during the interview. Later on she was smiling and was participating during the interview. We discussed about her medications at length with her daughter and her friend. Patient agreed  with the plan to start her on Lexapro 5 mg in the morning and to titrate the lamotrigine. She enjoyed working in the yard yesterday with  her daughter. She reported that she sleeps well at night. Her memory is improving. She is not experiencing any adverse reactions to her medications. She currently denied using any drugs or alcohol at this time.  Elements:  Severity:  mild. Associated Signs/Symptoms: Depression Symptoms:  depressed mood, fatigue, impaired memory, (Hypo) Manic Symptoms:  Distractibility, Impulsivity, Anxiety Symptoms:  none Psychotic Symptoms:  none PTSD Symptoms: Negative NA  Past Medical History:  Past Medical History  Diagnosis Date  . Heart disease   . Hypertension   . Bipolar disorder ([place])   . 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 GERD (gastroesophageal reflux disease)   . Malignant neoplasm of thyroid gland [facility]) January 23, 2011    medullary carcinoma thyroid T2, Nx.  . IBS (irritable bowel syndrome) 2000  . MCI (mild cognitive impairment) 2017    Past Surgical History  Procedure Laterality Date  . Hernia repair  2007  . Coronary stent placement  2007  . Cholecystectomy  2006  . Thyroidectomy  2012  . Colonoscopy  2008  . Nasal sinus surgery  2008  . Carotid endarterectomy Right November 2015    [name], MD   Family History:  Family History  Problem Relation Age of Onset  . Cancer Sister     Bone  . Cancer Brother     Bone  . Cancer Sister     Breast  . Cancer Sister     Colon    Social History:   Social History   Social History  . Marital Status: Single    Spouse Name: N/A  . Number of Children: N/A  . Years of Education: N/A   Social History Main Topics  . Smoking status: Former Smoker -- 1.00 packs/day for 10 years    Quit date: 12/01/1974  . Smokeless tobacco: Never Used  . Alcohol Use: No  . Drug Use: No  . Sexual Activity: Not Currently   Other Topics Concern  . None   Social History Narrative   Additional Social History:  Patient is currently divorced. She has a daughter and 5 grandchildren.  [name].  Musculoskeletal: Strength & Muscle Tone: within normal limits Gait & Station: normal Patient leans: N/A  Psychiatric Specialty Exam: HPI  ROS  Blood pressure 122/68, pulse 71, temperature 97.9 F (36.6 C), temperature source Tympanic, height 5' 3"  (1.6 m), weight 137 lb 9.6 oz (62.415 kg), SpO2 97 %.Body mass index is 24.38 kg/(m^2).  General Appearance: Casual  Eye Contact:  Fair  Speech:  Slow  Volume:  Normal  Mood:  Anxious  Affect:  Constricted  Thought Process:  Disorganized  Orientation:  Full (Time, Place, and Person)  Thought Content:  WDL  Suicidal Thoughts:  No  Homicidal Thoughts:  No  Memory:  impaired  Judgement:  Poor  Insight:  Lacking  Psychomotor Activity:  Decreased  Concentration:  Poor  Recall:  Poor  Fund of Knowledge:Fair  Language: Fair  Akathisia:  No  Handed:  Right  AIMS (if indicated):    Assets:  Communication Skills Housing Social Support  ADL's:  Intact  Cognition: WNL  Sleep:       Allergies:  No Known Allergies Current Medications: Current Outpatient Prescriptions  Medication Sig Dispense Refill  .  aspirin 81 MG tablet Take 81 mg by mouth daily.    Marland Kitchen donepezil (ARICEPT) 10 MG tablet Take 1 tablet (10 mg total) by mouth at bedtime. 30 tablet 1  . isosorbide mononitrate (IMDUR) 30 MG 24 hr tablet TAKE ONE TABLET BY MOUTH EVERY DAY 90 tablet 3  . lamoTRIgine (LAMICTAL) 25 MG tablet Take 2 tablets (50 mg total) by mouth daily. 60 tablet 1  . levothyroxine (SYNTHROID, LEVOTHROID) 75 MCG tablet TAKE ONE TABLET BY MOUTH EVERY DAY 30 tablet 10  . losartan (COZAAR) 50 MG tablet Take 1 tablet by mouth daily.    . mesalamine (LIALDA) 1.2 G EC tablet Take 2 tablets (2.4 g total) by mouth daily with breakfast. 60 tablet 3  . Multiple Vitamins-Minerals (MULTIVITAMIN WITH MINERALS) tablet Take 1 tablet by mouth daily.    . nitroGLYCERIN (NITROSTAT) 0.4 MG SL tablet Place under the tongue.     . pantoprazole (PROTONIX) 40 MG tablet TAKE 1 TABLET BY MOUTH ONCE A DAY 90 tablet 3  . simvastatin (ZOCOR) 40 MG tablet Take 1 tablet by mouth daily.     No current facility-administered medications for this visit.    Previous Psychotropic Medications: Patient is currently taking Lexapro 10 mg daily. She has never tried a mood stabilizer in the past. She was recently started on Aricept by Dr. Manuella Ghazi neurologist. She is reporting some diarrhea as the adverse effects. She does not have any previous history of psychiatric hospitalization.  Substance Abuse History in the last 12 months:  No.  Consequences of Substance Abuse: Negative NA  Medical Decision Making:  Review of Psycho-Social Stressors (1) and Review and summation of old records (2)  Treatment Plan Summary: Medication management   Discussed with patient and her friend and daughter  about the medications treatment risk benefits and alternatives I will start her on lamotrigine 75 mg by mouth daily. Advised them  about the side effects including risk of rash and Kathreen Cosier syndrome and she demonstrated understanding Start  Lexapro 5 mg daily.  She will also continue on Aricept 10 mg daily Follow-up in 1 month   More than 50% of the time spent in psychoeducation, counseling and coordination of care.    This note was generated in part or whole with voice recognition software. Voice regonition is usually quite accurate but there are transcription errors that can and very often do occur. I apologize for any typographical errors that were not detected and corrected.     Rainey Pines, MD  4/19/20172:14 PM

## 2016-03-21 DIAGNOSIS — I10 Essential (primary) hypertension: Secondary | ICD-10-CM | POA: Diagnosis not present

## 2016-03-21 DIAGNOSIS — I63231 Cerebral infarction due to unspecified occlusion or stenosis of right carotid arteries: Secondary | ICD-10-CM | POA: Diagnosis not present

## 2016-03-21 DIAGNOSIS — I2510 Atherosclerotic heart disease of native coronary artery without angina pectoris: Secondary | ICD-10-CM | POA: Diagnosis not present

## 2016-03-25 ENCOUNTER — Other Ambulatory Visit: Payer: Self-pay | Admitting: Family Medicine

## 2016-03-26 DIAGNOSIS — D0439 Carcinoma in situ of skin of other parts of face: Secondary | ICD-10-CM | POA: Diagnosis not present

## 2016-03-26 DIAGNOSIS — C44329 Squamous cell carcinoma of skin of other parts of face: Secondary | ICD-10-CM | POA: Diagnosis not present

## 2016-04-03 DIAGNOSIS — G3184 Mild cognitive impairment, so stated: Secondary | ICD-10-CM | POA: Diagnosis not present

## 2016-04-17 ENCOUNTER — Encounter: Payer: Self-pay | Admitting: Psychiatry

## 2016-04-17 ENCOUNTER — Ambulatory Visit (INDEPENDENT_AMBULATORY_CARE_PROVIDER_SITE_OTHER): Payer: 59 | Admitting: Psychiatry

## 2016-04-17 VITALS — BP 120/72 | HR 75 | Temp 97.2°F | Ht 63.0 in | Wt 135.2 lb

## 2016-04-17 DIAGNOSIS — F0280 Dementia in other diseases classified elsewhere without behavioral disturbance: Secondary | ICD-10-CM | POA: Diagnosis not present

## 2016-04-17 DIAGNOSIS — F331 Major depressive disorder, recurrent, moderate: Secondary | ICD-10-CM | POA: Diagnosis not present

## 2016-04-17 MED ORDER — LAMOTRIGINE 25 MG PO TABS: 75.0000 mg | ORAL_TABLET | Freq: Every day | ORAL | Status: DC

## 2016-04-17 MED ORDER — DONEPEZIL HCL 10 MG PO TABS: 10.0000 mg | ORAL_TABLET | Freq: Every day | ORAL | Status: DC

## 2016-04-17 MED ORDER — ESCITALOPRAM OXALATE 10 MG PO TABS: 10.0000 mg | ORAL_TABLET | Freq: Every day | ORAL | Status: DC

## 2016-04-17 NOTE — Progress Notes (Signed)
Psychiatric MD Progress Note  Patient Identification: Margaret Romero MRN:  [id number] Date of Evaluation:  04/17/2016 Referral Source: PCP Chief Complaint:   Chief Complaint    Follow-up; Medication Refill     Visit Diagnosis:    ICD-9-CM ICD-10-CM   1. 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 Moderate episode of recurrent major depressive disorder (HCC) 296.32 F33.1    Diagnosis:   Patient Active Problem List   Diagnosis Date Noted  . Bipolar I disorder ([place]) [F31.9] 01/22/2016  . Bipolar affective disorder ([place]) [F31.9] 01/22/2016  . Pancytopenia ([place]) [R42.706] 11/20/2015  . Skin lesion [L98.9] 11/20/2015  . Disease of skin and subcutaneous tissue [L98.9] 11/20/2015  . Bipolar 2 disorder ([place]) [F31.81] 11/16/2015  . Body mass index (BMI) of 22.0-22.9 in adult [facility] 11/16/2015  . Arteriosclerosis of coronary artery [I25.10] 11/16/2015  . Cancer of thyroid ([place]) [C73] 11/16/2015  . Colitis [K52.9] 11/16/2015  . Acute cystitis [N30.00] 11/16/2015  . Clinical depression [F32.9] 11/16/2015  . 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 Presence of stent in coronary artery [Z95.5] 11/16/2015  . HLD (hyperlipidemia) [E78.5] 11/16/2015  . Adaptive colitis [K59.8] 11/16/2015  . Bad memory [R41.3] 11/16/2015  . 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 Carotid artery narrowing [I65.29] 11/16/2015  . Carpal bone fracture [S62.109A] 11/16/2015  . 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 CAD in native artery [I25.10] 11/16/2015  . Bipolar II disorder ([place]) [F31.81] 11/16/2015  . Closed fracture carpal bone [S62.109A] 11/16/2015  . Diverticulitis [K57.92] 11/16/2015  . Major depressive disorder with single episode ([place]) [F32.9] 11/16/2015  . Malignant neoplasm of thyroid gland ([place]) [C73] 11/16/2015  . Gastroenteritis, non-infectious [K52.9] 11/16/2015  . Carotid artery obstruction  [I65.29] 11/16/2015  . Amnesia [R41.3] 11/16/2015  . Functional bowel disorder [K59.9] 11/16/2015  . History of cardiovascular surgery [Z98.890] 11/16/2015  . Acid reflux [K21.9] 08/14/2015  . Anxiety [F41.9] 08/14/2015  . Anxiety disorder [F41.9] 08/14/2015  . Hypertension [I10] 06/13/2015  . Essential (primary) hypertension [I10] 06/13/2015  . Ulcerative colitis ([place]) [K51.90] 06/01/2015  . Absolute anemia [D64.9] 12/15/2014  . Fecal occult blood test positive [R19.5] 12/15/2014  . Abnormal feces [R19.5] 12/15/2014  . SOB (shortness of breath) on exertion [R06.02] 11/01/2014  . Hypoxemia [R09.02] 11/01/2014  . Breath shortness [R06.02] 11/01/2014  . BP (high blood pressure) [I10] 02/24/2014  . Personal history of malignant neoplasm of thyroid [Z85.850] 01/13/2014   History of Present Illness:  Patient is a 70 year old female who presented for follow-up accompanied by her friend.  Her friend Flonnie, Reported that patient has been doing very well and she has improved on her medications. Patient appeared very calm and alert during this interview. Patient reported that she has started working back again and she has been compliant with her medications. She is taking Lexapro 5 mg daily and lamotrigine 75 mg. Her energy level is back again. She is not having any withdrawal symptoms. She is able to sleep well at night. She currently denied having any perceptual disturbances. She denied having any suicidal ideations or plans. 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 She is also working in the yard. Her energy level has returned. She was laughing and  smiling appropriately during the interview.  She is not experiencing any adverse reactions to her medications. She currently denied using any drugs or  alcohol at this time.  Elements:  Severity:  mild. Associated Signs/Symptoms: Depression Symptoms:  fatigue, (Hypo) Manic Symptoms:  Labiality of Mood, Anxiety Symptoms:  none Psychotic Symptoms:  none PTSD Symptoms: Negative NA  Past Medical History:  Past Medical History  Diagnosis Date  . Heart disease   . Hypertension   . Bipolar disorder ([place])   . 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 GERD (gastroesophageal reflux disease)   . Malignant neoplasm of thyroid gland [facility]) January 23, 2011    medullary carcinoma thyroid T2, Nx.  . IBS (irritable bowel syndrome) 2000  . MCI (mild cognitive impairment) 2017    Past Surgical History  Procedure Laterality Date  . Hernia repair  2007  . Coronary stent placement  2007  . Cholecystectomy  2006  . Thyroidectomy  2012  . Colonoscopy  2008  . Nasal sinus surgery  2008  . Carotid endarterectomy Right November 2015    [name], MD  . Skin cancer excision     Family History:  Family History  Problem Relation Age of Onset  . Cancer Sister     Bone  . Cancer Brother     Bone  . Cancer Sister     Breast  . Cancer Sister     Colon    Social History:   Social History   Social History  . Marital Status: Single    Spouse Name: N/A  . Number of Children: N/A  . Years of Education: N/A   Social History Main Topics  . Smoking status: Former Smoker -- 1.00 packs/day for 10 years    Quit date: 12/01/1974  . Smokeless tobacco: Never Used  . Alcohol Use: No  . Drug Use: No  . Sexual Activity: Not Currently   Other Topics Concern  . None   Social History Narrative   Additional Social History:  Patient is currently divorced. She has a daughter and 5 grandchildren. [name].  Musculoskeletal: Strength & Muscle Tone: within normal limits Gait & Station: normal Patient leans: N/A  Psychiatric Specialty Exam: HPI  ROS  Blood  pressure 120/72, pulse 75, temperature 97.2 F (36.2 C), temperature source Tympanic, height 5' 3"  (1.6 m), weight 135 lb 3.2 oz (61.326 kg), SpO2 96 %.Body mass index is 23.96 kg/(m^2).  General Appearance: Casual  Eye Contact:  Fair  Speech:  Clear and Coherent  Volume:  Normal  Mood:  happy  Affect:  Congruent  Thought Process:  Goal Directed and Logical  Orientation:  Full (Time, Place, and Person)  Thought Content:  WDL  Suicidal Thoughts:  No  Homicidal Thoughts:  No  Memory:  impaired  Judgement:  Poor  Insight:  Lacking  Psychomotor Activity:  Normal  Concentration:  Poor  Recall:  Poor  Fund of Knowledge:Fair  Language: Fair  Akathisia:  No  Handed:  Right  AIMS (if indicated):    Assets:  Communication Skills Housing Social Support  ADL's:  Intact  Cognition: WNL  Sleep:       Allergies:  No Known Allergies Current Medications: Current Outpatient Prescriptions  Medication Sig Dispense Refill  . aspirin 81 MG tablet Take 81 mg by mouth daily.    Marland Kitchen donepezil (ARICEPT) 10 MG tablet Take 1 tablet (10  mg total) by mouth at bedtime. 30 tablet 1  . escitalopram (LEXAPRO) 10 MG tablet Take 1 tablet (10 mg total) by mouth daily. 30 tablet 1  . isosorbide mononitrate (IMDUR) 30 MG 24 hr tablet TAKE ONE TABLET BY MOUTH EVERY DAY 90 tablet 3  . lamoTRIgine (LAMICTAL) 25 MG tablet Take 3 tablets (75 mg total) by mouth daily. 90 tablet 1  . levothyroxine (SYNTHROID, LEVOTHROID) 75 MCG tablet TAKE ONE TABLET BY MOUTH EVERY DAY 30 tablet 10  . LIALDA 1.2 g EC tablet TAKE 2 TABLETS (2.4 GRAMS TOTAL) BY MOUTH DAILY WITH BREAKFAST. 60 tablet 5  . losartan (COZAAR) 50 MG tablet Take 1 tablet by mouth daily.    . Multiple Vitamins-Minerals (MULTIVITAMIN WITH MINERALS) tablet Take 1 tablet by mouth daily.    . nitroGLYCERIN (NITROSTAT) 0.4 MG SL tablet Place under the tongue.    . pantoprazole (PROTONIX) 40 MG tablet TAKE 1 TABLET BY MOUTH ONCE A DAY 90 tablet 3  . simvastatin  (ZOCOR) 40 MG tablet Take 1 tablet by mouth daily.     No current facility-administered medications for this visit.    Previous Psychotropic Medications: Patient is currently taking Lexapro 10 mg daily. She has never tried a mood stabilizer in the past. She was recently started on Aricept by Dr. Manuella Ghazi neurologist. She is reporting some diarrhea as the adverse effects. She does not have any previous history of psychiatric hospitalization.  Substance Abuse History in the last 12 months:  No.  Consequences of Substance Abuse: Negative NA  Medical Decision Making:  Review of Psycho-Social Stressors (1) and Review and summation of old records (2)  Treatment Plan Summary: Medication management   Discussed with patient and her friend about the medications treatment risk benefits and alternatives Continue  lamotrigine 75 mg by mouth daily. Advised them  about the side effects including risk of rash and Kathreen Cosier syndrome and she demonstrated understanding Continue  Lexapro 5 mg daily.  She will also continue on Aricept 10 mg daily Follow-up in 2 month   More than 50% of the time spent in psychoeducation, counseling and coordination of care.    This note was generated in part or whole with voice recognition software. Voice regonition is usually quite accurate but there are transcription errors that can and very often do occur. I apologize for any typographical errors that were not detected and corrected.     Rainey Pines, MD  5/17/20172:07 PM

## 2016-05-15 ENCOUNTER — Encounter: Payer: Self-pay | Admitting: General Surgery

## 2016-06-11 ENCOUNTER — Ambulatory Visit (INDEPENDENT_AMBULATORY_CARE_PROVIDER_SITE_OTHER): Payer: Medicare Other | Admitting: Family Medicine

## 2016-06-11 ENCOUNTER — Encounter: Payer: Self-pay | Admitting: Family Medicine

## 2016-06-11 VITALS — BP 144/60 | HR 66 | Temp 97.7°F | Resp 16 | Ht 62.0 in | Wt 139.0 lb

## 2016-06-11 DIAGNOSIS — E2839 Other primary ovarian failure: Secondary | ICD-10-CM

## 2016-06-11 DIAGNOSIS — I2510 Atherosclerotic heart disease of native coronary artery without angina pectoris: Secondary | ICD-10-CM

## 2016-06-11 DIAGNOSIS — Z0000 Encounter for general adult medical examination without abnormal findings: Secondary | ICD-10-CM

## 2016-06-11 DIAGNOSIS — R7401 Elevation of levels of liver transaminase levels: Secondary | ICD-10-CM

## 2016-06-11 DIAGNOSIS — R161 Splenomegaly, not elsewhere classified: Secondary | ICD-10-CM

## 2016-06-11 DIAGNOSIS — I10 Essential (primary) hypertension: Secondary | ICD-10-CM | POA: Diagnosis not present

## 2016-06-11 DIAGNOSIS — D696 Thrombocytopenia, unspecified: Secondary | ICD-10-CM

## 2016-06-11 DIAGNOSIS — C73 Malignant neoplasm of thyroid gland: Secondary | ICD-10-CM | POA: Diagnosis not present

## 2016-06-11 DIAGNOSIS — Z23 Encounter for immunization: Secondary | ICD-10-CM

## 2016-06-11 DIAGNOSIS — R740 Nonspecific elevation of levels of transaminase and lactic acid dehydrogenase [LDH]: Secondary | ICD-10-CM

## 2016-06-11 DIAGNOSIS — D61818 Other pancytopenia: Secondary | ICD-10-CM

## 2016-06-11 DIAGNOSIS — R195 Other fecal abnormalities: Secondary | ICD-10-CM

## 2016-06-11 NOTE — Progress Notes (Signed)
Patient: Margaret Romero, Female    DOB: [birth year], 70 y.o.   MRN: [id number] Visit Date: 06/11/2016  Today's Provider: Lelon Huh, MD   Chief Complaint  Patient presents with  . Annual Exam  . Hypertension    follow up  . Hypothyroidism    follow up   Subjective:    Annual wellness visit Margaret Romero is a 70 y.o. female. She feels fairly well. She reports exercising daily. She reports she is sleeping fairly well.  -----------------------------------------------------------  Hypertension, follow-up:  BP Readings from Last 3 Encounters:  04/17/16 120/72  03/20/16 122/68  02/28/16 162/70    She was last seen for hypertension 7 months ago.  BP at that visit was 140/60. Management since that visit includes no changes. She reports good compliance with treatment. She is not having side effects.  She is exercising. She is adherent to low salt diet.   Outside blood pressures are not being checked. She is experiencing none.  Patient denies chest pain, chest pressure/discomfort, claudication, dyspnea, exertional chest pressure/discomfort, fatigue, irregular heart beat and lower extremity edema.   Cardiovascular risk factors include advanced age (older than 98 for men, 63 for women) and hypertension.  Use of agents associated with hypertension: NSAIDS.     Weight trend: fluctuating a bit Wt Readings from Last 3 Encounters:  04/17/16 135 lb 3.2 oz (61.326 kg)  03/20/16 137 lb 9.6 oz (62.415 kg)  02/28/16 139 lb (63.05 kg)    Current diet: in general, an "unhealthy" diet  ------------------------------------------------------------------------  Follow up Hypothyroidism: Patient was last seen 7 months ago by Dr. Venia Minks and no changes were made. Patient reports good compliance with treatment.   Follow up Bipolar Disorder: Patient was last seen 7 months ago by Dr. Venia Minks. Management during that visit includes referring patient to Psychiatry and is now managed  by Dr. Gretel Acre. She feels she is doing well on current medications.   Review of Systems  Constitutional: Negative for fever, chills and fatigue.  HENT: Negative for congestion, ear pain, rhinorrhea, sneezing and sore throat.   Eyes: Negative.  Negative for pain and redness.  Respiratory: Negative for cough, shortness of breath and wheezing.   Cardiovascular: Negative for chest pain and leg swelling.  Gastrointestinal: Negative for nausea, abdominal pain, diarrhea, constipation and blood in stool.  Endocrine: Negative for polydipsia and polyphagia.  Genitourinary: Negative.  Negative for dysuria, hematuria, flank pain, vaginal bleeding, vaginal discharge and pelvic pain.  Musculoskeletal: Negative for back pain, joint swelling, arthralgias and gait problem.  Skin: Negative for rash.  Neurological: Negative.  Negative for dizziness, tremors, seizures, weakness, light-headedness, numbness and headaches.  Hematological: Negative for adenopathy.  Psychiatric/Behavioral: Positive for behavioral problems, confusion and agitation. Negative for dysphoric mood. The patient is not nervous/anxious and is not hyperactive.     Social History   Social History  . Marital Status: Single    Spouse Name: N/A  . Number of Children: N/A  . Years of Education: N/A   Occupational History  . Not on file.   Social History Main Topics  . Smoking status: Former Smoker -- 1.00 packs/day for 10 years    Quit date: 12/01/1974  . Smokeless tobacco: Never Used  . Alcohol Use: No  . Drug Use: No  . Sexual Activity: Not Currently   Other Topics Concern  . Not on file   Social History Narrative    Past Medical History  Diagnosis Date  . Heart  disease   . Hypertension   . Bipolar disorder ([place])   . 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 GERD (gastroesophageal reflux disease)   . Malignant neoplasm of thyroid gland [facility]) January 23, 2011    medullary  carcinoma thyroid T2, Nx.  . IBS (irritable bowel syndrome) 2000  . MCI (mild cognitive impairment) 2017     Patient Active Problem List   Diagnosis Date Noted  . Bipolar I disorder ([place]) 01/22/2016  . Bipolar affective disorder ([place]) 01/22/2016  . Pancytopenia ([place]) 11/20/2015  . Skin lesion 11/20/2015  . Disease of skin and subcutaneous tissue 11/20/2015  . Bipolar 2 disorder ([place]) 11/16/2015  . Body mass index (BMI) of 22.0-22.9 in adult 11/16/2015  . Arteriosclerosis of coronary artery 11/16/2015  . Cancer of thyroid ([place]) 11/16/2015  . Colitis 11/16/2015  . Acute cystitis 11/16/2015  . Clinical depression 11/16/2015  . 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 Presence of stent in coronary artery 11/16/2015  . HLD (hyperlipidemia) 11/16/2015  . Adaptive colitis 11/16/2015  . Bad memory 11/16/2015  . Bone marrow failure ([place]) 11/16/2015  . Carotid artery narrowing 11/16/2015  . Carpal bone fracture 11/16/2015  . 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 CAD in native artery 11/16/2015  . Bipolar II disorder ([place]) 11/16/2015  . Closed fracture carpal bone 11/16/2015  . Diverticulitis 11/16/2015  . Major depressive disorder with single episode ([place]) 11/16/2015  . Malignant neoplasm of thyroid gland ([place]) 11/16/2015  . Gastroenteritis, non-infectious 11/16/2015  . Carotid artery obstruction 11/16/2015  . Amnesia 11/16/2015  . Functional bowel disorder 11/16/2015  . History of cardiovascular surgery 11/16/2015  . Acid reflux 08/14/2015  . Anxiety 08/14/2015  . Anxiety disorder 08/14/2015  . Hypertension 06/13/2015  . Essential (primary) hypertension 06/13/2015  . Ulcerative colitis ([place]) 06/01/2015  . Absolute anemia 12/15/2014  . Fecal occult blood test positive 12/15/2014  . Abnormal feces 12/15/2014  . SOB (shortness of breath) on exertion 11/01/2014  . Hypoxemia 11/01/2014  . Breath shortness 11/01/2014  . BP (high blood pressure) 02/24/2014  .  Personal history of malignant neoplasm of thyroid 01/13/2014    Past Surgical History  Procedure Laterality Date  . Hernia repair  2007  . Coronary stent placement  2007  . Cholecystectomy  2006  . Thyroidectomy  2012  . Colonoscopy  2008  . Nasal sinus surgery  2008  . Carotid endarterectomy Right November 2015    [name], MD  . Skin cancer excision      Her family history includes Cancer in her brother, sister, sister, and sister.    Current Meds  Medication Sig  . Albuterol Sulfate (VENTOLIN HFA IN) Inhale into the lungs as needed.  Marland Kitchen aspirin 81 MG tablet Take 81 mg by mouth daily.  Marland Kitchen donepezil (ARICEPT) 10 MG tablet Take 1 tablet (10 mg total) by mouth at bedtime.  Marland Kitchen escitalopram (LEXAPRO) 10 MG tablet Take 1 tablet (10 mg total) by mouth daily.  . isosorbide mononitrate (IMDUR) 30 MG 24 hr tablet TAKE ONE TABLET BY MOUTH EVERY DAY  . lamoTRIgine (LAMICTAL) 25 MG tablet Take 3 tablets (75 mg total) by mouth daily.  Marland Kitchen levothyroxine (SYNTHROID, LEVOTHROID) 75 MCG tablet TAKE ONE TABLET BY MOUTH EVERY DAY  . LIALDA 1.2 g EC tablet TAKE 2 TABLETS (2.4 GRAMS TOTAL) BY MOUTH DAILY WITH BREAKFAST.  Marland Kitchen losartan (COZAAR) 50 MG tablet Take 1 tablet by mouth daily.  Marland Kitchen  Multiple Vitamins-Minerals (MULTIVITAMIN WITH MINERALS) tablet Take 1 tablet by mouth daily.  . nitroGLYCERIN (NITROSTAT) 0.4 MG SL tablet Place under the tongue.  . pantoprazole (PROTONIX) 40 MG tablet TAKE 1 TABLET BY MOUTH ONCE A DAY  . simvastatin (ZOCOR) 40 MG tablet Take 1 tablet by mouth daily.    Patient Care Team: Margarita Rana, MD as PCP - General (Family Medicine) [name], MD (General Surgery) Margarita Rana, MD as Referring Physician (Family Medicine)    Objective:   Vitals: BP 144/60 mmHg  Pulse 66  Temp(Src) 97.7 F (36.5 C) (Oral)  Resp 16  Ht 5' 2"  (1.575 m)  Wt 139 lb (63.05 kg)  BMI 25.42 kg/m2  SpO2 97%  Physical Exam   General Appearance:    Alert, cooperative, no  distress, appears stated age  Head:    Normocephalic, without obvious abnormality, atraumatic  Eyes:    PERRL, conjunctiva/corneas clear, EOM's intact, fundi    benign, both eyes  Ears:    Normal TM's and external ear canals, both ears  Nose:   Nares normal, septum midline, mucosa normal, no drainage    or sinus tenderness  Throat:   Lips, mucosa, and tongue normal; teeth and gums normal  Neck:   Supple, symmetrical, trachea midline, no adenopathy;    thyroid:  no enlargement/tenderness/nodules; left carotid   bruit  Back:     Symmetric, no curvature, ROM normal, no CVA tenderness  Lungs:     Clear to auscultation bilaterally, respirations unlabored  Chest Wall:    No tenderness or deformity   Heart:    Regular rate and rhythm, S1 and S2 normal, no murmur, rub   or gallop  Breast Exam:    deferred  Abdomen:     Soft, non-tender, bowel sounds active all four quadrants,    no masses, no organomegaly  Pelvic:    deferred  Extremities:   Extremities normal, atraumatic, no cyanosis or edema  Pulses:   2+ and symmetric all extremities  Skin:   Skin color, texture, turgor normal, no rashes or lesions  Lymph nodes:   Cervical, supraclavicular, and axillary nodes normal  Neurologic:   CNII-XII intact, normal strength, sensation and reflexes    throughout    Activities of Daily Living In your present state of health, do you have any difficulty performing the following activities: 06/11/2016  Hearing? N  Vision? N  Difficulty concentrating or making decisions? Y  Walking or climbing stairs? N  Dressing or bathing? N  Doing errands, shopping? N    Fall Risk Assessment Fall Risk  06/11/2016  Falls in the past year? No     Depression Screen PHQ 2/9 Scores 06/11/2016  PHQ - 2 Score 1  PHQ- 9 Score 3    Cognitive Testing - 6-CIT  Correct? Score   What year is it? yes 0 0 or 4  What month is it? yes 0 0 or 3  Memorize:    Pia Mau,  40,  [place],      What time is  it? (within 1 hour) yes 0 0 or 3  Count backwards from 20 yes 0 0, 2, or 4  Name the months of the year yes 0 0, 2, or 4  Repeat name & address above no 10 0, 2, 4, 6, 8, or 10       TOTAL SCORE  10/28   Interpretation:  Abnormal- *  Normal (0-7) Abnormal (8-28)  Audit-C Alcohol Use Screening  Question Answer Points  How often do you have alcoholic drink? never 0  On days you do drink alcohol, how many drinks do you typically consume? n/a 0  How oftey will you drink 6 or more in a total? never 0  Total Score:  0   A score of 3 or more in women, and 4 or more in men indicates increased risk for alcohol abuse, EXCEPT if all of the points are from question 1.   Current Exercise Habits: Home exercise routine, Type of exercise: walking;Other - see comments (yardwork), Time (Minutes): 60, Frequency (Times/Week): 7, Weekly Exercise (Minutes/Week): 420, Intensity: Mild Exercise limited by: None identified   Assessment & Plan:     Annual physical Reviewed patient's Family Medical History Reviewed and updated list of patient's medical providers Assessment of cognitive impairment was done Assessed patient's functional ability Established a written schedule for health screening [place] Completed and Reviewed  Exercise Activities and Dietary recommendations Goals    None      Immunization History  Administered Date(s) Administered  . Influenza,inj,Quad PF,36+ Mos 09/01/2014  . Influenza-Unspecified 09/01/2013, 09/01/2015    Health Maintenance  Topic Date Due  . Hepatitis C Screening  [birth year]  . TETANUS/TDAP  06/16/1963  . ZOSTAVAX  06/15/2004  . MAMMOGRAM  10/14/2008  . DEXA SCAN  06/15/2009  . PNA vac Low Risk Adult (1 of 2 - PCV13) 06/15/2009  . COLONOSCOPY  03/02/2014  . INFLUENZA VACCINE  07/02/2016      Discussed health benefits of physical activity, and encouraged her to engage in regular exercise appropriate for her age and condition.     ------------------------------------------------------------------------------------------------------------ 1. Annual physical exam   2. Essential (primary) hypertension Stable. Continue current medications.    3. Splenomegaly  - CBC - US Abdomen Complete; Future  4. Thrombocytopenia (HCC)  - US Abdomen Complete; Future   5. Estrogen deficiency  - DG Bone Density; Future  6. Elevated transaminase level  - Hepatitis C antibody - Hepatitis B surface antibody - Hepatitis B Core Antibody, total - Hepatic function panel - US Abdomen Complete; Future  7. Coronary artery disease involving native coronary artery of native heart without angina pectoris Asymptomatic. Compliant with medication.  Continue aggressive risk factor modification.  Continue routine follow up with Dr. Saralyn Pilar  9. Cancer of thyroid ([place]) Followed by Dr. Bary Castilla - T4 AND TSH  9. Pancytopenia (HCC) Check CBC - US Abdomen Complete; Future  10. Need for pneumococcal vaccination  - Pneumococcal conjugate vaccine 13-valent IM    Lelon Huh, MD  [facility]

## 2016-06-14 ENCOUNTER — Ambulatory Visit: Payer: Medicare Other

## 2016-06-14 LAB — HEPATIC FUNCTION PANEL
ALBUMIN: 4.1 g/dL (ref 3.5–4.8)
ALT: 17 IU/L (ref 0–32)
AST: 38 IU/L (ref 0–40)
Alkaline Phosphatase: 151 IU/L — ABNORMAL HIGH (ref 39–117)
Bilirubin Total: 1.4 mg/dL — ABNORMAL HIGH (ref 0.0–1.2)
Bilirubin, Direct: 0.51 mg/dL — ABNORMAL HIGH (ref 0.00–0.40)
Total Protein: 6.5 g/dL (ref 6.0–8.5)

## 2016-06-14 LAB — CBC
HEMATOCRIT: 34.1 % (ref 34.0–46.6)
HEMOGLOBIN: 11.4 g/dL (ref 11.1–15.9)
MCH: 30.9 pg (ref 26.6–33.0)
MCHC: 33.4 g/dL (ref 31.5–35.7)
MCV: 92 fL (ref 79–97)
Platelets: 81 10*3/uL — CL (ref 150–379)
RBC: 3.69 x10E6/uL — AB (ref 3.77–5.28)
RDW: 16.6 % — ABNORMAL HIGH (ref 12.3–15.4)
WBC: 3.7 10*3/uL (ref 3.4–10.8)

## 2016-06-14 LAB — HEPATITIS C ANTIBODY

## 2016-06-14 LAB — HEPATITIS B SURFACE ANTIBODY,QUALITATIVE: HEP B SURFACE AB, QUAL: NONREACTIVE

## 2016-06-14 LAB — LIPID PANEL
CHOL/HDL RATIO: 2.8 ratio (ref 0.0–4.4)
CHOLESTEROL TOTAL: 143 mg/dL (ref 100–199)
HDL: 51 mg/dL (ref >39)
LDL Calculated: 73 mg/dL (ref 0–99)
TRIGLYCERIDES: 93 mg/dL (ref 0–149)
VLDL Cholesterol Cal: 19 mg/dL (ref 5–40)

## 2016-06-14 LAB — T4 AND TSH
T4 TOTAL: 9.9 ug/dL (ref 4.5–12.0)
TSH: 4.44 u[IU]/mL (ref 0.450–4.500)

## 2016-06-14 LAB — HEPATITIS B CORE ANTIBODY, TOTAL: Hep B Core Total Ab: NEGATIVE

## 2016-06-17 ENCOUNTER — Ambulatory Visit: Payer: Medicare Other | Admitting: Psychiatry

## 2016-06-19 ENCOUNTER — Encounter: Payer: Self-pay | Admitting: Family Medicine

## 2016-06-24 ENCOUNTER — Encounter: Payer: Self-pay | Admitting: General Surgery

## 2016-06-24 NOTE — Progress Notes (Signed)
Medication refill request received. Recent T4, TSH completed earlier this summer w/ Dr. [name] were normal. Will renew levothyroxine at 0.75 mg/ day.

## 2016-06-26 ENCOUNTER — Encounter: Payer: Self-pay | Admitting: *Deleted

## 2016-06-26 ENCOUNTER — Other Ambulatory Visit: Payer: Self-pay | Admitting: General Surgery

## 2016-06-26 DIAGNOSIS — Z85850 Personal history of malignant neoplasm of thyroid: Secondary | ICD-10-CM

## 2016-06-27 ENCOUNTER — Ambulatory Visit
Admission: RE | Admit: 2016-06-27 | Discharge: 2016-06-27 | Disposition: A | Discharge status: Home / Self Care | Payer: Medicare Other | Source: Ambulatory Visit | Attending: Family Medicine | Admitting: Family Medicine

## 2016-06-27 DIAGNOSIS — D696 Thrombocytopenia, unspecified: Secondary | ICD-10-CM | POA: Diagnosis present

## 2016-06-27 DIAGNOSIS — R740 Nonspecific elevation of levels of transaminase and lactic acid dehydrogenase [LDH]: Secondary | ICD-10-CM | POA: Diagnosis present

## 2016-06-27 DIAGNOSIS — D61818 Other pancytopenia: Secondary | ICD-10-CM | POA: Diagnosis not present

## 2016-06-27 DIAGNOSIS — R7401 Elevation of levels of liver transaminase levels: Secondary | ICD-10-CM

## 2016-06-27 DIAGNOSIS — R161 Splenomegaly, not elsewhere classified: Secondary | ICD-10-CM | POA: Diagnosis not present

## 2016-06-27 DIAGNOSIS — K7460 Unspecified cirrhosis of liver: Secondary | ICD-10-CM | POA: Diagnosis not present

## 2016-06-27 DIAGNOSIS — Z9049 Acquired absence of other specified parts of digestive tract: Secondary | ICD-10-CM | POA: Insufficient documentation

## 2016-07-03 ENCOUNTER — Telehealth: Payer: Self-pay | Admitting: *Deleted

## 2016-07-03 ENCOUNTER — Other Ambulatory Visit: Payer: Self-pay | Admitting: Family Medicine

## 2016-07-03 DIAGNOSIS — R161 Splenomegaly, not elsewhere classified: Secondary | ICD-10-CM

## 2016-07-03 DIAGNOSIS — K7689 Other specified diseases of liver: Secondary | ICD-10-CM | POA: Insufficient documentation

## 2016-07-03 NOTE — Telephone Encounter (Signed)
-----   Message from Birdie Sons, MD sent at 07/03/2016  1:58 PM EDT ----- Ultrasound shows enlarged spleen and signs of liver disease, possible cirrhosis. Needs referral to liver specialist for further evaluation. Please advise and forward to sarah to refer to Dr. Allen Norris.

## 2016-07-03 NOTE — Telephone Encounter (Signed)
Please schedule referral to GI Dr. Allen Norris. Thanks!

## 2016-07-10 ENCOUNTER — Other Ambulatory Visit: Payer: Self-pay | Admitting: Psychiatry

## 2016-07-10 ENCOUNTER — Other Ambulatory Visit: Payer: Self-pay | Admitting: Family Medicine

## 2016-07-10 DIAGNOSIS — I10 Essential (primary) hypertension: Secondary | ICD-10-CM

## 2016-07-11 ENCOUNTER — Ambulatory Visit
Admission: RE | Admit: 2016-07-11 | Discharge: 2016-07-11 | Disposition: A | Discharge status: Home / Self Care | Payer: Medicare Other | Source: Ambulatory Visit | Attending: Family Medicine | Admitting: Family Medicine

## 2016-07-11 DIAGNOSIS — M8588 Other specified disorders of bone density and structure, other site: Secondary | ICD-10-CM | POA: Diagnosis not present

## 2016-07-11 DIAGNOSIS — M85851 Other specified disorders of bone density and structure, right thigh: Secondary | ICD-10-CM | POA: Insufficient documentation

## 2016-07-11 DIAGNOSIS — E2839 Other primary ovarian failure: Secondary | ICD-10-CM | POA: Diagnosis not present

## 2016-07-11 DIAGNOSIS — Z780 Asymptomatic menopausal state: Secondary | ICD-10-CM | POA: Diagnosis not present

## 2016-07-12 ENCOUNTER — Encounter: Payer: Self-pay | Admitting: Family Medicine

## 2016-07-12 DIAGNOSIS — M8580 Other specified disorders of bone density and structure, unspecified site: Secondary | ICD-10-CM | POA: Insufficient documentation

## 2016-07-15 ENCOUNTER — Other Ambulatory Visit: Payer: Self-pay | Admitting: Psychiatry

## 2016-07-17 ENCOUNTER — Encounter: Payer: Self-pay | Admitting: Psychiatry

## 2016-07-17 ENCOUNTER — Ambulatory Visit (INDEPENDENT_AMBULATORY_CARE_PROVIDER_SITE_OTHER): Payer: Medicare Other | Admitting: Psychiatry

## 2016-07-17 VITALS — BP 163/69 | HR 65 | Temp 97.8°F | Ht 62.0 in | Wt 138.2 lb

## 2016-07-17 DIAGNOSIS — F0280 Dementia in other diseases classified elsewhere without behavioral disturbance: Secondary | ICD-10-CM

## 2016-07-17 DIAGNOSIS — F3181 Bipolar II disorder: Secondary | ICD-10-CM | POA: Diagnosis not present

## 2016-07-17 MED ORDER — ESCITALOPRAM OXALATE 10 MG PO TABS: 10.0000 mg | ORAL_TABLET | Freq: Every day | ORAL | 1 refills | Status: DC

## 2016-07-17 MED ORDER — LAMOTRIGINE 25 MG PO TABS: 75.0000 mg | ORAL_TABLET | Freq: Every day | ORAL | 1 refills | Status: DC

## 2016-07-17 MED ORDER — DONEPEZIL HCL 10 MG PO TABS: 10.0000 mg | ORAL_TABLET | Freq: Every day | ORAL | 1 refills | Status: DC

## 2016-07-17 NOTE — Progress Notes (Signed)
Psychiatric MD Progress Note  Patient Identification: Margaret Romero MRN:  [id number] Date of Evaluation:  07/17/2016 Referral Source: PCP Chief Complaint:   Chief Complaint    Follow-up; Medication Refill     Visit Diagnosis:    ICD-9-CM ICD-10-CM   1. 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 Bipolar 2 disorder ([place]) 296.89 F31.81    Diagnosis:   Patient Active Problem List   Diagnosis Date Noted  . Osteopenia [M85.80] 07/12/2016  . Nodular hyperplasia of liver [K76.89] 07/03/2016  . Splenomegaly [R16.1] 06/11/2016  . Thrombocytopenia ([place]) [D69.6] 06/11/2016  . Bipolar affective disorder ([place]) [F31.9] 01/22/2016  . Pancytopenia ([place]) [N82.956] 11/20/2015  . Skin lesion [L98.9] 11/20/2015  . Disease of skin and subcutaneous tissue [L98.9] 11/20/2015  . Body mass index (BMI) of 22.0-22.9 in adult [facility] 11/16/2015  . Cancer of thyroid ([place]) [C73] 11/16/2015  . Colitis [K52.9] 11/16/2015  . Clinical depression [F32.9] 11/16/2015  . 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 Presence of stent in coronary artery [Z95.5] 11/16/2015  . HLD (hyperlipidemia) [E78.5] 11/16/2015  . Bad memory [R41.3] 11/16/2015  . Carpal bone fracture [S62.109A] 11/16/2015  . Anemia due to bone marrow failure ([place]) [D61.9] 11/16/2015  . CAD in native artery [I25.10] 11/16/2015  . Closed fracture carpal bone [S62.109A] 11/16/2015  . Diverticulitis [K57.92] 11/16/2015  . Major depressive disorder with single episode ([place]) [F32.9] 11/16/2015  . Malignant neoplasm of thyroid gland ([place]) [C73] 11/16/2015  . Gastroenteritis, non-infectious [K52.9] 11/16/2015  . Carotid artery obstruction [I65.29] 11/16/2015  . History of cardiovascular surgery [Z98.890] 11/16/2015  . Acid reflux [K21.9] 08/14/2015  . Anxiety disorder [F41.9] 08/14/2015  . Hypertension [I10] 06/13/2015  . Essential (primary) hypertension [I10] 06/13/2015  . Ulcerative  colitis ([place]) [K51.90] 06/01/2015  . Fecal occult blood test positive [R19.5] 12/15/2014  . SOB (shortness of breath) on exertion [R06.02] 11/01/2014  . Hypoxemia [R09.02] 11/01/2014  . Breath shortness [R06.02] 11/01/2014  . Personal history of malignant neoplasm of thyroid [Z85.850] 01/13/2014   History of Present Illness:  Patient is a 70 year old female who presented for follow-up accompanied by her friend.  Her friend Flonnie, Reported that patient has been doing very well and she has improved on her medications. Patient appeared very calm and alert during this interview. Patient Is going to follow up with the gastroenterologist as there is some concern about her liver function. Her friend was concerned about the same. Patient reported that she is working on a regular basis and is having some problems with the sleep. However she does not want to take any new indication at this time. She is compliant with her medications. She appeared pleasant and cooperative during the interview.   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 She is also working in the yard. Her energy level has returned. She was laughing and smiling appropriately during the interview.  She is not experiencing any adverse reactions to her medications. She currently denied using any drugs or alcohol at this time.  Elements:  Severity:  mild. Associated Signs/Symptoms: Depression Symptoms:  fatigue, (Hypo) Manic Symptoms:  Labiality of Mood, Anxiety Symptoms:  none Psychotic Symptoms:  none PTSD Symptoms: Negative NA  Past Medical History:  Past Medical History:  Diagnosis Date  . Bipolar disorder ([place])   . GERD (gastroesophageal reflux disease)   . Heart disease   . Hypertension   . IBS (irritable bowel syndrome) 2000  .  Malignant neoplasm of thyroid gland [facility]) January 23, 2011   medullary carcinoma thyroid T2, Nx.  Marland Kitchen MCI (mild cognitive impairment) 2017  . Occlusion and  stenosis of carotid artery without mention of cerebral infarction     Past Surgical History:  Procedure Laterality Date  . CAROTID ENDARTERECTOMY Right November 2015   [name], MD  . [name]  2006  . COLONOSCOPY  2008  . CORONARY STENT PLACEMENT  2007  . HERNIA REPAIR  2007  . NASAL SINUS SURGERY  2008  . SKIN CANCER EXCISION    . THYROIDECTOMY  2012   Family History:  Family History  Problem Relation Age of Onset  . Cancer Sister     Bone  . Cancer Brother     Bone  . Cancer Sister     Breast  . Cancer Sister     Colon    Social History:   Social History   Social History  . Marital status: Single    Spouse name: N/A  . Number of children: 1  . Years of education: N/A   Occupational History  . Retired    Social History Main Topics  . Smoking status: Former Smoker    Packs/day: 1.00    Years: 10.00    Quit date: 12/01/1974  . Smokeless tobacco: Never Used  . Alcohol use No  . Drug use: No  . Sexual activity: Not Currently   Other Topics Concern  . None   Social History Narrative  . None   Additional Social History:  Patient is currently divorced. She has a daughter and 5 grandchildren. [name].  Musculoskeletal: Strength & Muscle Tone: within normal limits Gait & Station: normal Patient leans: N/A  Psychiatric Specialty Exam: HPI  ROS  Blood pressure (!) 163/69, pulse 65, temperature 97.8 F (36.6 C), temperature source Oral, height 5' 2"  (1.575 m), weight 138 lb 3.2 oz (62.7 kg).Body mass index is 25.28 kg/m.  General Appearance: Casual  Eye Contact:  Fair  Speech:  Clear and Coherent  Volume:  Normal  Mood:  happy  Affect:  Congruent  Thought Process:  Goal Directed and Logical  Orientation:  Full (Time, Place, and Person)  Thought Content:  WDL  Suicidal Thoughts:  No  Homicidal Thoughts:  No  Memory:  impaired  Judgement:  Poor  Insight:  Lacking  Psychomotor Activity:   Normal  Concentration:  Poor  Recall:  Poor  Fund of Knowledge:Fair  Language: Fair  Akathisia:  No  Handed:  Right  AIMS (if indicated):    Assets:  Communication Skills Housing Social Support  ADL's:  Intact  Cognition: WNL  Sleep:       Allergies:  No Known Allergies Current Medications: Current Outpatient Prescriptions  Medication Sig Dispense Refill  . Albuterol Sulfate (VENTOLIN HFA IN) Inhale into the lungs as needed.    Marland Kitchen aspirin 81 MG tablet Take 81 mg by mouth daily.    Marland Kitchen escitalopram (LEXAPRO) 10 MG tablet Take 1 tablet (10 mg total) by mouth daily. 30 tablet 1  . isosorbide mononitrate (IMDUR) 30 MG 24 hr tablet TAKE 1 TABLET BY MOUTH ONCE A DAY 90 tablet 3  . lamoTRIgine (LAMICTAL) 25 MG tablet Take 3 tablets (75 mg total) by mouth daily. 90 tablet 1  . levothyroxine (SYNTHROID, LEVOTHROID) 75 MCG tablet TAKE ONE TABLET BY MOUTH EVERY DAY 30 tablet 10  . LIALDA 1.2 g EC tablet TAKE  2 TABLETS (2.4 GRAMS TOTAL) BY MOUTH DAILY WITH BREAKFAST. 60 tablet 5  . losartan (COZAAR) 50 MG tablet Take 1 tablet by mouth daily.    . Multiple Vitamins-Minerals (MULTIVITAMIN WITH MINERALS) tablet Take 1 tablet by mouth daily.    . nitroGLYCERIN (NITROSTAT) 0.4 MG SL tablet Place under the tongue.    . pantoprazole (PROTONIX) 40 MG tablet TAKE 1 TABLET BY MOUTH ONCE A DAY 90 tablet 3  . simvastatin (ZOCOR) 40 MG tablet Take 1 tablet by mouth daily.    Marland Kitchen donepezil (ARICEPT) 10 MG tablet Take 1 tablet (10 mg total) by mouth at bedtime. 30 tablet 1   No current facility-administered medications for this visit.     Previous Psychotropic Medications: Patient is currently taking Lexapro 10 mg daily. She has never tried a mood stabilizer in the past. She was recently started on Aricept by Dr. Manuella Ghazi neurologist. She is reporting some diarrhea as the adverse effects. She does not have any previous history of psychiatric hospitalization.  Substance Abuse History in the last 12 months:   No.  Consequences of Substance Abuse: Negative NA  Medical Decision Making:  Review of Psycho-Social Stressors (1) and Review and summation of old records (2)  Treatment Plan Summary: Medication management   Discussed with patient and her friend about the medications treatment risk benefits and alternatives Continue  lamotrigine 75 mg by mouth daily. Advised them  about the side effects including risk of rash and Kathreen Cosier syndrome and she demonstrated understanding Continue  Lexapro 5 mg daily.  She will also continue on Aricept 10 mg daily Follow-up in 3 month   More than 50% of the time spent in psychoeducation, counseling and coordination of care.    This note was generated in part or whole with voice recognition software. Voice regonition is usually quite accurate but there are transcription errors that can and very often do occur. I apologize for any typographical errors that were not detected and corrected.     Rainey Pines, MD  8/16/201712:08 PM

## 2016-08-14 ENCOUNTER — Encounter: Payer: Self-pay | Admitting: *Deleted

## 2016-08-16 LAB — CALCITONIN: Calcitonin: 89.3 pg/mL — ABNORMAL HIGH (ref 0.0–5.0)

## 2016-08-16 LAB — CEA: CEA: 16.8 ng/mL — AB (ref 0.0–4.7)

## 2016-08-20 ENCOUNTER — Encounter: Payer: Self-pay | Admitting: Gastroenterology

## 2016-08-20 ENCOUNTER — Other Ambulatory Visit: Payer: Self-pay

## 2016-08-20 ENCOUNTER — Ambulatory Visit (INDEPENDENT_AMBULATORY_CARE_PROVIDER_SITE_OTHER): Payer: Medicare Other | Admitting: Gastroenterology

## 2016-08-20 VITALS — BP 139/56 | HR 56 | Temp 97.7°F | Ht 62.0 in | Wt 138.4 lb

## 2016-08-20 DIAGNOSIS — K7460 Unspecified cirrhosis of liver: Secondary | ICD-10-CM | POA: Diagnosis not present

## 2016-08-20 NOTE — Progress Notes (Signed)
Gastroenterology Consultation  Referring Provider:     Margarita Rana, MD Primary Care Physician:  Margarita Rana, MD Primary Gastroenterologist:  Dr. Allen Norris     Reason for Consultation:     Possible cirrhosis        HPI:   Margaret Romero is a 70 y.o. y/o female referred for consultation & management of Possible cirrhosis by Dr. Margarita Rana, MD.  This patient comes today with a history of significant alcohol abuse that lasted approximately 20 years.  The patient was then found to have thrombocytopenia and had an ultrasound consistent with cirrhosis.  The patient denies any abdominal pain nausea vomiting fevers or chills.  The patient states she has been feeling very well and is very reluctant to undergo any testing or procedures due to her feeling so well.  The patient has been very active as reported by her caregiver.  The patient no longer drinks.  Her last colonoscopy was in 2005. The patient has had a negative hepatitis B and hepatitis C serology.  The patient also has had a chronically elevated CEA. The patient's liver enzymes showed elevation of the alkaline phosphatase and bilirubin with both the direct and indirect being elevated  Past Medical History:  Diagnosis Date  . Bipolar disorder ([place])   . GERD (gastroesophageal reflux disease)   . Heart disease   . Hypertension   . IBS (irritable bowel syndrome) 2000  . Malignant neoplasm of thyroid gland [facility]) January 23, 2011   medullary carcinoma thyroid T2, Nx.  Marland Kitchen MCI (mild cognitive impairment) 2017  . Occlusion and stenosis of carotid artery without mention of cerebral infarction     Past Surgical History:  Procedure Laterality Date  . CAROTID ENDARTERECTOMY Right November 2015   [name], MD  . [name]  2006  . COLONOSCOPY  2008  . CORONARY STENT PLACEMENT  2007  . HERNIA REPAIR  2007  . NASAL SINUS SURGERY  2008  . SKIN CANCER EXCISION    . THYROIDECTOMY  2012    Prior to Admission medications     Medication Sig Start Date End Date Taking? Authorizing Provider  Albuterol Sulfate (VENTOLIN HFA IN) Inhale into the lungs as needed.   Yes Historical Provider, MD  aspirin 81 MG tablet Take 81 mg by mouth daily.   Yes Historical Provider, MD  donepezil (ARICEPT) 10 MG tablet Take 1 tablet (10 mg total) by mouth at bedtime. 07/17/16 10/15/16 Yes Rainey Pines, MD  escitalopram (LEXAPRO) 10 MG tablet Take 1 tablet (10 mg total) by mouth daily. 07/17/16  Yes Rainey Pines, MD  isosorbide mononitrate (IMDUR) 30 MG 24 hr tablet TAKE 1 TABLET BY MOUTH ONCE A DAY 07/10/16  Yes Birdie Sons, MD  lamoTRIgine (LAMICTAL) 25 MG tablet Take 3 tablets (75 mg total) by mouth daily. 07/17/16  Yes Rainey Pines, MD  levothyroxine (SYNTHROID, LEVOTHROID) 75 MCG tablet TAKE ONE TABLET BY MOUTH EVERY DAY 06/04/15  Yes [name], MD  LIALDA 1.2 g EC tablet TAKE 2 TABLETS (2.4 GRAMS TOTAL) BY MOUTH DAILY WITH BREAKFAST. 03/25/16  Yes Margarita Rana, MD  losartan (COZAAR) 50 MG tablet Take 1 tablet by mouth daily. 12/23/13  Yes Historical Provider, MD  Multiple Vitamin (MULTI-VITAMINS) TABS Take by mouth.   Yes Historical Provider, MD  Multiple Vitamins-Minerals (MULTIVITAMIN WITH MINERALS) tablet Take 1 tablet by mouth daily.   Yes Historical Provider, MD  nitroGLYCERIN (NITROSTAT) 0.4 MG SL tablet Place under the tongue. 12/17/10  Yes  Historical Provider, MD  pantoprazole (PROTONIX) 40 MG tablet TAKE 1 TABLET BY MOUTH ONCE A DAY 03/11/16  Yes Margarita Rana, MD  simvastatin (ZOCOR) 40 MG tablet Take 1 tablet by mouth daily. 01/11/14  Yes Historical Provider, MD    Family History  Problem Relation Age of Onset  . Cancer Sister     Bone  . Cancer Brother     Bone  . Cancer Sister     Breast  . Cancer Sister     Colon      Social History  Substance Use Topics  . Smoking status: Former Smoker    Packs/day: 1.00    Years: 10.00    Quit date: 12/01/1974  . Smokeless tobacco: Never Used  . Alcohol use No     Allergies as of 08/20/2016  . (No Known Allergies)    Review of Systems:    All systems reviewed and negative except where noted in HPI.   Physical Exam:  BP (!) 139/56   Pulse (!) 56   Temp 97.7 F (36.5 C) (Oral)   Ht 5' 2"  (1.575 m)   Wt 138 lb 6.4 oz (62.8 kg)   BMI 25.31 kg/m  No LMP recorded. Patient is postmenopausal. Psych:  Alert and cooperative. Normal mood and affect. General:   Alert,  Well-developed, well-nourished, pleasant and cooperative in NAD Head:  Normocephalic and atraumatic. Eyes:  Sclera clear, no icterus.   Conjunctiva pink. Ears:  Normal auditory acuity. Nose:  No deformity, discharge, or lesions. Mouth:  No deformity or lesions,oropharynx pink & moist. Neck:  Supple; no masses or thyromegaly. Lungs:  Respirations even and unlabored.  Clear throughout to auscultation.   No wheezes, crackles, or rhonchi. No acute distress. Heart:  Regular rate and rhythm; no murmurs, clicks, rubs, or gallops. Abdomen:  Normal bowel sounds.  No bruits.  Soft, non-tender and non-distended without masses, hepatosplenomegaly or hernias noted.  No guarding or rebound tenderness.  Negative Carnett sign.   Rectal:  Deferred.  Msk:  Symmetrical without gross deformities.  Good, equal movement & strength bilaterally. Pulses:  Normal pulses noted. Extremities:  No clubbing or edema.  No cyanosis. Neurologic:  Alert and oriented x3;  grossly normal neurologically. Skin:  Intact without significant lesions or rashes.  No jaundice. Lymph Nodes:  No significant cervical adenopathy. Psych:  Alert and cooperative. Normal mood and affect.  Imaging Studies: No results found.  Assessment and Plan:   Margaret Romero is a 70 y.o. y/o female who was found to have thrombocytopenia and cirrhosis showing up on a ultrasound.  The patient's liver disease can be due to her history of alcohol abuse or may be related to the medication she is on.  Since the patient's alkaline phosphatase up I  will send up and AMA a fractionation of the alkaline phosphatase and a GGT to see if there is any active inflammation going on in the liver.  The patient has been instructed to have a colonoscopy but is resistant.  The patient has been explained the plan and agrees with it.   Note: This dictation was prepared with Dragon dictation along with smaller phrase technology. Any transcriptional errors that result from this process are unintentional.

## 2016-08-21 ENCOUNTER — Ambulatory Visit (INDEPENDENT_AMBULATORY_CARE_PROVIDER_SITE_OTHER): Payer: Medicare Other | Admitting: General Surgery

## 2016-08-21 ENCOUNTER — Encounter: Payer: Self-pay | Admitting: General Surgery

## 2016-08-21 VITALS — BP 124/66 | HR 72 | Resp 12 | Ht 63.0 in | Wt 138.0 lb

## 2016-08-21 DIAGNOSIS — Z85850 Personal history of malignant neoplasm of thyroid: Secondary | ICD-10-CM

## 2016-08-21 NOTE — Patient Instructions (Addendum)
Call with any changes or concerns.Follow up in 6 months CEA and Calcitonin.

## 2016-08-21 NOTE — Progress Notes (Signed)
Patient ID: Margaret Romero, female   DOB: 1946/03/16, 70 y.o.   MRN: [id number]  Chief Complaint  Patient presents with  . Follow-up    thyroid    HPI Margaret Romero is a 70 y.o. female here today for a six month thyroid follow up. She had Calcitonin and CEA done 08/15/16. She reports Dr. Allen Norris is following her for liver and spleen are enlargedHPI  She states she is doing well. Her friend Margaret Romero is present with her. Her daughter had a massive heart attack 3 weeks ago.   Past Medical History:  Diagnosis Date  . Bipolar disorder ([place])   . GERD (gastroesophageal reflux disease)   . Heart disease   . Hypertension   . IBS (irritable bowel syndrome) 2000  . Malignant neoplasm of thyroid gland [facility]) January 23, 2011   medullary carcinoma thyroid T2, Nx.  Marland Kitchen MCI (mild cognitive impairment) 2017  . Occlusion and stenosis of carotid artery without mention of cerebral infarction     Past Surgical History:  Procedure Laterality Date  . CAROTID ENDARTERECTOMY Right November 2015   [name], MD  . [name]  2006  . COLONOSCOPY  2008  . CORONARY STENT PLACEMENT  2007  . HERNIA REPAIR  2007  . NASAL SINUS SURGERY  2008  . SKIN CANCER EXCISION    . THYROIDECTOMY  2012    Family History  Problem Relation Age of Onset  . Cancer Sister     Bone  . Cancer Brother     Bone  . Cancer Sister     Breast  . Cancer Sister     Colon     Social History Social History  Substance Use Topics  . Smoking status: Former Smoker    Packs/day: 1.00    Years: 10.00    Quit date: 12/01/1974  . Smokeless tobacco: Never Used  . Alcohol use No    No Known Allergies  Current Outpatient Prescriptions  Medication Sig Dispense Refill  . Albuterol Sulfate (VENTOLIN HFA IN) Inhale into the lungs as needed.    Marland Kitchen aspirin 81 MG tablet Take 81 mg by mouth daily.    Marland Kitchen donepezil (ARICEPT) 10 MG tablet Take 1 tablet (10 mg total) by mouth at bedtime. 30 tablet 1  . escitalopram (LEXAPRO)  10 MG tablet Take 1 tablet (10 mg total) by mouth daily. 30 tablet 1  . isosorbide mononitrate (IMDUR) 30 MG 24 hr tablet TAKE 1 TABLET BY MOUTH ONCE A DAY 90 tablet 3  . lamoTRIgine (LAMICTAL) 25 MG tablet Take 3 tablets (75 mg total) by mouth daily. 90 tablet 1  . levothyroxine (SYNTHROID, LEVOTHROID) 75 MCG tablet TAKE ONE TABLET BY MOUTH EVERY DAY 30 tablet 10  . LIALDA 1.2 g EC tablet TAKE 2 TABLETS (2.4 GRAMS TOTAL) BY MOUTH DAILY WITH BREAKFAST. 60 tablet 5  . losartan (COZAAR) 50 MG tablet Take 1 tablet by mouth daily.    . Multiple Vitamins-Minerals (MULTIVITAMIN WITH MINERALS) tablet Take 1 tablet by mouth daily.    . nitroGLYCERIN (NITROSTAT) 0.4 MG SL tablet Place under the tongue.    . pantoprazole (PROTONIX) 40 MG tablet TAKE 1 TABLET BY MOUTH ONCE A DAY 90 tablet 3  . simvastatin (ZOCOR) 40 MG tablet Take 1 tablet by mouth daily.     No current facility-administered medications for this visit.     Review of Systems Review of Systems  Constitutional: Negative.   Respiratory: Negative.   Cardiovascular: Negative.  Blood pressure 124/66, pulse 72, resp. rate 12, height 5' 3"  (1.6 m), weight 138 lb (62.6 kg).  Physical Exam Physical Exam  Constitutional: She is oriented to person, place, and time. She appears well-developed and well-nourished.  Eyes: Conjunctivae are normal. No scleral icterus.  Neck: Neck supple. No thyromegaly present.    Cardiovascular: Regular rhythm and normal heart sounds.   Pulmonary/Chest: Effort normal and breath sounds normal.  Abdominal: Soft. Bowel sounds are normal.  Lymphadenopathy:    She has no cervical adenopathy.  Neurological: She is alert and oriented to person, place, and time.  Skin: Skin is warm and dry.    Data Reviewed Recently completed laboratory studies on 08/15/2016 shows a slight rise in the serum calcitonin at 89.3 and CEA at 16.8. Suggestive of recurrent disease but no clinical evidence.  Assessment     Continued poor health. Unlikely candidate for additional surgical exploration.    Plan    Calcitonin level below 100, we'll hold repeat imaging at this time.    Follow up in 6 months CEA and Calcitonin.     This information has been scribed by [name], CMA     [name] 08/22/2016, 7:10 AM

## 2016-08-22 LAB — ALKALINE PHOSPHATASE, ISOENZYMES
BONE FRACTION: 26 % (ref 14–68)
INTESTINAL FRAC.: 0 % (ref 0–18)
LIVER FRACTION: 74 % (ref 18–85)

## 2016-08-22 LAB — HEPATIC FUNCTION PANEL
ALT: 14 IU/L (ref 0–32)
AST: 37 IU/L (ref 0–40)
Albumin: 3.9 g/dL (ref 3.5–4.8)
Alkaline Phosphatase: 148 IU/L — ABNORMAL HIGH (ref 39–117)
BILIRUBIN, DIRECT: 0.37 mg/dL (ref 0.00–0.40)
Bilirubin Total: 1 mg/dL (ref 0.0–1.2)
TOTAL PROTEIN: 6.3 g/dL (ref 6.0–8.5)

## 2016-08-22 LAB — ANA: ANA: NEGATIVE

## 2016-08-22 LAB — GAMMA GT: GGT: 221 IU/L — AB (ref 0–60)

## 2016-08-22 LAB — MITOCHONDRIAL ANTIBODIES: MITOCHONDRIAL AB: 12.9 U (ref 0.0–20.0)

## 2016-09-04 ENCOUNTER — Other Ambulatory Visit: Payer: Self-pay | Admitting: Family Medicine

## 2016-09-04 NOTE — Telephone Encounter (Signed)
Saw you 07/11/217 for annual wellness. Margaret Romero, CMA

## 2016-09-10 ENCOUNTER — Other Ambulatory Visit: Payer: Self-pay | Admitting: Psychiatry

## 2016-09-17 ENCOUNTER — Other Ambulatory Visit: Payer: Self-pay | Admitting: Family Medicine

## 2016-10-09 ENCOUNTER — Other Ambulatory Visit: Payer: Self-pay | Admitting: Psychiatry

## 2016-10-14 ENCOUNTER — Other Ambulatory Visit: Payer: Self-pay | Admitting: Psychiatry

## 2016-10-16 ENCOUNTER — Ambulatory Visit (INDEPENDENT_AMBULATORY_CARE_PROVIDER_SITE_OTHER): Payer: Medicare Other | Admitting: Psychiatry

## 2016-10-16 ENCOUNTER — Encounter: Payer: Self-pay | Admitting: Psychiatry

## 2016-10-16 VITALS — BP 128/60 | HR 68 | Ht 62.25 in | Wt 137.2 lb

## 2016-10-16 DIAGNOSIS — F3181 Bipolar II disorder: Secondary | ICD-10-CM | POA: Diagnosis not present

## 2016-10-16 DIAGNOSIS — F0280 Dementia in other diseases classified elsewhere without behavioral disturbance: Secondary | ICD-10-CM | POA: Diagnosis not present

## 2016-10-16 MED ORDER — LAMOTRIGINE 25 MG PO TABS: 75.0000 mg | ORAL_TABLET | Freq: Every day | ORAL | 1 refills | Status: DC

## 2016-10-16 MED ORDER — DONEPEZIL HCL 10 MG PO TABS: 10.0000 mg | ORAL_TABLET | Freq: Every day | ORAL | 1 refills | Status: DC

## 2016-10-16 MED ORDER — ESCITALOPRAM OXALATE 10 MG PO TABS: 10.0000 mg | ORAL_TABLET | Freq: Every day | ORAL | 4 refills | Status: DC

## 2016-10-16 NOTE — Progress Notes (Signed)
Psychiatric MD Progress Note  Patient Identification: Margaret Romero MRN:  [id number] Date of Evaluation:  10/16/2016 Referral Source: PCP Chief Complaint:   Chief Complaint    Follow-up     Visit Diagnosis:    ICD-9-CM ICD-10-CM   1. 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 Bipolar 2 disorder ([place]) 296.89 F31.81    Diagnosis:   Patient Active Problem List   Diagnosis Date Noted  . Osteopenia [M85.80] 07/12/2016  . Nodular hyperplasia of liver [K76.89] 07/03/2016  . Splenomegaly [R16.1] 06/11/2016  . Thrombocytopenia ([place]) [D69.6] 06/11/2016  . Bipolar affective disorder ([place]) [F31.9] 01/22/2016  . Pancytopenia ([place]) [P71.062] 11/20/2015  . Skin lesion [L98.9] 11/20/2015  . Disease of skin and subcutaneous tissue [L98.9] 11/20/2015  . Body mass index (BMI) of 22.0-22.9 in adult [facility] 11/16/2015  . Cancer of thyroid ([place]) [C73] 11/16/2015  . Clinical depression [F32.9] 11/16/2015  . 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 Presence of stent in coronary artery [Z95.5] 11/16/2015  . HLD (hyperlipidemia) [E78.5] 11/16/2015  . Bad memory [R41.3] 11/16/2015  . Carpal bone fracture [S62.109A] 11/16/2015  . 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 CAD in native artery [I25.10] 11/16/2015  . Closed fracture carpal bone [S62.109A] 11/16/2015  . Diverticulitis [K57.92] 11/16/2015  . 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 Malignant neoplasm of thyroid gland ([place]) [C73] 11/16/2015  . Gastroenteritis, non-infectious [K52.9] 11/16/2015  . Carotid artery obstruction [I65.29] 11/16/2015  . History of cardiovascular surgery [Z98.890] 11/16/2015  . Acid reflux [K21.9] 08/14/2015  . Anxiety disorder [F41.9] 08/14/2015  . Hypertension [I10] 06/13/2015  . Essential (primary) hypertension [I10] 06/13/2015  . Ulcerative colitis ([place]) [K51.90] 06/01/2015  . Fecal occult  blood test positive [R19.5] 12/15/2014  . SOB (shortness of breath) on exertion [R06.02] 11/01/2014  . Hypoxemia [R09.02] 11/01/2014  . Breath shortness [R06.02] 11/01/2014  . Personal history of malignant neoplasm of thyroid [Z85.850] 01/13/2014   History of Present Illness:  Patient is a 70 year old female who presented for follow-up accompanied by her friend.  Her friend Flonnie, Reported that patient has been Having some mood symptoms. Her friend also provided a letter which was from her daughter. Her daughter has mentioned that patient is having more symptoms and is happy one day  and depressed the other day. However patient is compliant with her medication. 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 She is not having any acute issues with her medications. Her friend remains supportive. Patient is also taking her medications as prescribed. Discussed with her friend at length about the medications and that patient has long history of bipolar disorder and she appears a stable on the medication. Her friend also agreed with the plan. She appeared pleasant and cooperative during the interview.   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 She is also working in the yard. Her energy level has returned. She was laughing and smiling appropriately during the interview.  She is not experiencing any adverse reactions to her medications. She currently denied using any drugs or alcohol at this time.  Elements:  Severity:  mild. Associated Signs/Symptoms: Depression Symptoms:  fatigue, (Hypo) Manic Symptoms:  Labiality of Mood, Anxiety Symptoms:  none Psychotic Symptoms:  none PTSD Symptoms: Negative NA  Past Medical History:  Past Medical History:  Diagnosis Date  . Bipolar disorder ([place])   . GERD (gastroesophageal reflux disease)   .  Heart disease   . Hypertension   . IBS (irritable bowel syndrome) 2000  . Malignant neoplasm of thyroid gland [facility])  January 23, 2011   medullary carcinoma thyroid T2, Nx.  Marland Kitchen MCI (mild cognitive impairment) 2017  . Occlusion and stenosis of carotid artery without mention of cerebral infarction     Past Surgical History:  Procedure Laterality Date  . CAROTID ENDARTERECTOMY Right November 2015   [name], MD  . [name]  2006  . COLONOSCOPY  2008  . CORONARY STENT PLACEMENT  2007  . HERNIA REPAIR  2007  . NASAL SINUS SURGERY  2008  . SKIN CANCER EXCISION    . THYROIDECTOMY  2012   Family History:  Family History  Problem Relation Age of Onset  . Cancer Sister     Bone  . Cancer Brother     Bone  . Cancer Sister     Breast  . Cancer Sister     Colon    Social History:   Social History   Social History  . Marital status: Single    Spouse name: N/A  . Number of children: 1  . Years of education: N/A   Occupational History  . Retired    Social History Main Topics  . Smoking status: Former Smoker    Packs/day: 1.00    Years: 10.00    Quit date: 12/01/1974  . Smokeless tobacco: Never Used  . Alcohol use No  . Drug use: No  . Sexual activity: Not Currently   Other Topics Concern  . None   Social History Narrative  . None   Additional Social History:  Patient is currently divorced. She has a daughter and 5 grandchildren. [name].  Musculoskeletal: Strength & Muscle Tone: within normal limits Gait & Station: normal Patient leans: N/A  Psychiatric Specialty Exam: Medication Refill     ROS  Blood pressure 128/60, pulse 68, height 5' 2.25" (1.581 m), weight 137 lb 3.2 oz (62.2 kg).Body mass index is 24.89 kg/m.  General Appearance: Casual  Eye Contact:  Fair  Speech:  Clear and Coherent  Volume:  Normal  Mood:  happy  Affect:  Congruent  Thought Process:  Goal Directed and Logical  Orientation:  Full (Time, Place, and Person)  Thought Content:  WDL  Suicidal Thoughts:  No  Homicidal Thoughts:  No  Memory:   impaired  Judgement:  Poor  Insight:  Lacking  Psychomotor Activity:  Normal  Concentration:  Poor  Recall:  Poor  Fund of Knowledge:Fair  Language: Fair  Akathisia:  No  Handed:  Right  AIMS (if indicated):    Assets:  Communication Skills Housing Social Support  ADL's:  Intact  Cognition: WNL  Sleep:       Allergies:  No Known Allergies Current Medications: Current Outpatient Prescriptions  Medication Sig Dispense Refill  . Albuterol Sulfate (VENTOLIN HFA IN) Inhale into the lungs as needed.    Marland Kitchen aspirin 81 MG tablet Take 81 mg by mouth daily.    Marland Kitchen donepezil (ARICEPT) 10 MG tablet Take 1 tablet (10 mg total) by mouth at bedtime. 30 tablet 1  . escitalopram (LEXAPRO) 10 MG tablet Take 1 tablet (10 mg total) by mouth daily. 90 tablet 4  . isosorbide mononitrate (IMDUR) 30 MG 24 hr tablet TAKE 1 TABLET BY MOUTH ONCE A DAY 90 tablet 3  . lamoTRIgine (LAMICTAL) 25 MG tablet Take 3 tablets (75 mg total) by  mouth daily. 90 tablet 1  . levothyroxine (SYNTHROID, LEVOTHROID) 75 MCG tablet TAKE ONE TABLET BY MOUTH EVERY DAY 30 tablet 10  . LIALDA 1.2 g EC tablet TAKE 2 TABLETS BY MOUTH ONCE A DAY WITH BREAKFAST 60 tablet 5  . losartan (COZAAR) 50 MG tablet Take 1 tablet by mouth daily.    . Multiple Vitamins-Minerals (MULTIVITAMIN WITH MINERALS) tablet Take 1 tablet by mouth daily.    . nitroGLYCERIN (NITROSTAT) 0.4 MG SL tablet Place under the tongue.    . pantoprazole (PROTONIX) 40 MG tablet TAKE 1 TABLET BY MOUTH ONCE A DAY 90 tablet 3  . simvastatin (ZOCOR) 40 MG tablet Take 1 tablet by mouth daily.     No current facility-administered medications for this visit.     Previous Psychotropic Medications: Patient is currently taking Lexapro 10 mg daily. She has never tried a mood stabilizer in the past. She was recently started on Aricept by Dr. Manuella Ghazi neurologist. She is reporting some diarrhea as the adverse effects. She does not have any previous history of psychiatric  hospitalization.  Substance Abuse History in the last 12 months:  No.  Consequences of Substance Abuse: Negative NA  Medical Decision Making:  Review of Psycho-Social Stressors (1) and Review and summation of old records (2)  Treatment Plan Summary: Medication management   Discussed with patient and her friend about the medications treatment risk benefits and alternatives Continue  lamotrigine 75 mg by mouth daily. Advised them  about the side effects including risk of rash and Kathreen Cosier syndrome and she demonstrated understanding Continue  Lexapro 10  mg daily.  She will also continue on Aricept 10 mg daily Follow-up in 2 month   More than 50% of the time spent in psychoeducation, counseling and coordination of care.    This note was generated in part or whole with voice recognition software. Voice regonition is usually quite accurate but there are transcription errors that can and very often do occur. I apologize for any typographical errors that were not detected and corrected.     Rainey Pines, MD  11/15/20172:32 PM

## 2016-12-03 DIAGNOSIS — L570 Actinic keratosis: Secondary | ICD-10-CM | POA: Diagnosis not present

## 2016-12-05 ENCOUNTER — Other Ambulatory Visit: Payer: Self-pay | Admitting: Family Medicine

## 2016-12-05 DIAGNOSIS — K219 Gastro-esophageal reflux disease without esophagitis: Secondary | ICD-10-CM

## 2016-12-09 ENCOUNTER — Other Ambulatory Visit: Payer: Self-pay | Admitting: Psychiatry

## 2016-12-09 NOTE — Telephone Encounter (Signed)
Done. Please make follow up appt.

## 2016-12-18 ENCOUNTER — Ambulatory Visit: Payer: Medicare Other | Admitting: Psychiatry

## 2017-01-01 ENCOUNTER — Other Ambulatory Visit: Payer: Self-pay

## 2017-01-01 DIAGNOSIS — Z85850 Personal history of malignant neoplasm of thyroid: Secondary | ICD-10-CM

## 2017-01-08 DIAGNOSIS — I10 Essential (primary) hypertension: Secondary | ICD-10-CM | POA: Diagnosis not present

## 2017-01-08 DIAGNOSIS — E7800 Pure hypercholesterolemia, unspecified: Secondary | ICD-10-CM | POA: Diagnosis not present

## 2017-01-08 DIAGNOSIS — I2510 Atherosclerotic heart disease of native coronary artery without angina pectoris: Secondary | ICD-10-CM | POA: Diagnosis not present

## 2017-01-08 DIAGNOSIS — Z9861 Coronary angioplasty status: Secondary | ICD-10-CM | POA: Diagnosis not present

## 2017-01-10 ENCOUNTER — Telehealth: Payer: Self-pay | Admitting: Family Medicine

## 2017-01-10 NOTE — Telephone Encounter (Signed)
Called Pt to schedule AWV with [facility] - knb

## 2017-01-15 DIAGNOSIS — H2511 Age-related nuclear cataract, right eye: Secondary | ICD-10-CM | POA: Diagnosis not present

## 2017-01-29 ENCOUNTER — Ambulatory Visit: Payer: Medicare Other | Admitting: Psychiatry

## 2017-02-19 ENCOUNTER — Encounter: Payer: Self-pay | Admitting: Psychiatry

## 2017-02-19 ENCOUNTER — Ambulatory Visit (INDEPENDENT_AMBULATORY_CARE_PROVIDER_SITE_OTHER): Payer: 59 | Admitting: Psychiatry

## 2017-02-19 VITALS — BP 185/61 | HR 61 | Wt 137.4 lb

## 2017-02-19 DIAGNOSIS — R4189 Other symptoms and signs involving cognitive functions and awareness: Secondary | ICD-10-CM | POA: Diagnosis not present

## 2017-02-19 DIAGNOSIS — F3181 Bipolar II disorder: Secondary | ICD-10-CM | POA: Diagnosis not present

## 2017-02-19 DIAGNOSIS — R4689 Other symptoms and signs involving appearance and behavior: Secondary | ICD-10-CM

## 2017-02-19 MED ORDER — ESCITALOPRAM OXALATE 10 MG PO TABS: 10.0000 mg | ORAL_TABLET | Freq: Every day | ORAL | 4 refills | Status: DC

## 2017-02-19 MED ORDER — DONEPEZIL HCL 10 MG PO TABS: 10.0000 mg | ORAL_TABLET | Freq: Every day | ORAL | 2 refills | Status: DC

## 2017-02-19 MED ORDER — LAMOTRIGINE 25 MG PO TABS: 75.0000 mg | ORAL_TABLET | Freq: Every day | ORAL | 2 refills | Status: DC

## 2017-02-19 NOTE — Progress Notes (Signed)
Psychiatric MD Progress Note  Patient Identification: Margaret Romero MRN:  [id number] Date of Evaluation:  02/19/2017 Referral Source: PCP Chief Complaint:   Chief Complaint    Follow-up; Medication Refill     Visit Diagnosis:    ICD-9-CM ICD-10-CM   1. Bipolar 2 disorder (HCC) 296.89 F31.81   2. Cognitive and behavioral changes 799.59 R41.89    312.9 R46.89    Diagnosis:   Patient Active Problem List   Diagnosis Date Noted  . Osteopenia [M85.80] 07/12/2016  . Nodular hyperplasia of liver [K76.89] 07/03/2016  . Splenomegaly [R16.1] 06/11/2016  . Thrombocytopenia ([place]) [D69.6] 06/11/2016  . Bipolar affective disorder ([place]) [F31.9] 01/22/2016  . Pancytopenia ([place]) [A25.053] 11/20/2015  . Skin lesion [L98.9] 11/20/2015  . Disease of skin and subcutaneous tissue [L98.9] 11/20/2015  . Body mass index (BMI) of 22.0-22.9 in adult [facility] 11/16/2015  . Cancer of thyroid ([place]) [C73] 11/16/2015  . Clinical depression [F32.9] 11/16/2015  . 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 Presence of stent in coronary artery [Z95.5] 11/16/2015  . HLD (hyperlipidemia) [E78.5] 11/16/2015  . Bad memory [R41.3] 11/16/2015  . Carpal bone fracture [S62.109A] 11/16/2015  . 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 CAD in native artery [I25.10] 11/16/2015  . Closed fracture carpal bone [S62.109A] 11/16/2015  . Diverticulitis [K57.92] 11/16/2015  . 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 Malignant neoplasm of thyroid gland ([place]) [C73] 11/16/2015  . Gastroenteritis, non-infectious [K52.9] 11/16/2015  . Carotid artery obstruction [I65.29] 11/16/2015  . History of cardiovascular surgery [Z98.890] 11/16/2015  . Acid reflux [K21.9] 08/14/2015  . Anxiety disorder [F41.9] 08/14/2015  . Hypertension [I10] 06/13/2015  . Essential (primary) hypertension [I10] 06/13/2015  . Ulcerative colitis ([place]) [K51.90] 06/01/2015  . Fecal occult blood test  positive [R19.5] 12/15/2014  . SOB (shortness of breath) on exertion [R06.02] 11/01/2014  . Hypoxemia [R09.02] 11/01/2014  . Breath shortness [R06.02] 11/01/2014  . Personal history of malignant neoplasm of thyroid [Z85.850] 01/13/2014   History of Present Illness:  Patient is a 71 year old female who presented for follow-up accompanied by her friend.  Her friend Flonnie, reported that [name]. 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 She has been compliant with her medications and does not have any acute symptoms. Patient remains pleasant and cooperative during the interview. She is not having any acute symptoms. Patient reported that she has been sleeping too houses during the week. She takes her own medications and does not have any side effects related to the medication. [name]. She does not have any acute issues at this time. Patient currently denied having any suicidal homicidal ideations or plans. She denied having any perceptual disturbances. She remains pleasant and cooperative during the interview.     Elements:  Severity:  mild. Associated Signs/Symptoms: Depression Symptoms:  fatigue, (Hypo) Manic Symptoms:  Labiality of Mood, Anxiety Symptoms:  none Psychotic Symptoms:  none PTSD Symptoms: Negative NA  Past Medical History:  Past Medical History:  Diagnosis Date  . Bipolar disorder ([place])   . GERD (gastroesophageal reflux disease)   . Heart disease   . Hypertension   . IBS (irritable bowel syndrome) 2000  . Malignant neoplasm of thyroid gland [facility]) January 23, 2011   medullary carcinoma thyroid T2, Nx.  Marland Kitchen MCI (mild cognitive impairment) 2017  . Occlusion and stenosis of carotid artery  without mention of cerebral infarction     Past Surgical History:  Procedure Laterality Date  . CAROTID ENDARTERECTOMY Right November 2015   [name], MD   . [name]  2006  . COLONOSCOPY  2008  . CORONARY STENT PLACEMENT  2007  . HERNIA REPAIR  2007  . NASAL SINUS SURGERY  2008  . SKIN CANCER EXCISION    . THYROIDECTOMY  2012   Family History:  Family History  Problem Relation Age of Onset  . Cancer Sister     Bone  . Cancer Brother     Bone  . Cancer Sister     Breast  . Cancer Sister     Colon    Social History:   Social History   Social History  . Marital status: Single    Spouse name: N/A  . Number of children: 1  . Years of education: N/A   Occupational History  . Retired    Social History Main Topics  . Smoking status: Former Smoker    Packs/day: 1.00    Years: 10.00    Quit date: 12/01/1974  . Smokeless tobacco: Never Used  . Alcohol use No  . Drug use: No  . Sexual activity: Not Currently   Other Topics Concern  . None   Social History Narrative  . None   Additional Social History:  Patient is currently divorced. She has a daughter and 5 grandchildren. [name].  Musculoskeletal: Strength & Muscle Tone: within normal limits Gait & Station: normal Patient leans: N/A  Psychiatric Specialty Exam: Medication Refill     ROS  There were no vitals taken for this visit.There is no height or weight on file to calculate BMI.  General Appearance: Casual  Eye Contact:  Fair  Speech:  Clear and Coherent  Volume:  Normal  Mood:  happy  Affect:  Congruent  Thought Process:  Goal Directed and Logical  Orientation:  Full (Time, Place, and Person)  Thought Content:  WDL  Suicidal Thoughts:  No  Homicidal Thoughts:  No  Memory:  impaired  Judgement:  Poor  Insight:  Lacking  Psychomotor Activity:  Normal  Concentration:  Poor  Recall:  Poor  Fund of Knowledge:Fair  Language: Fair  Akathisia:  No  Handed:  Right  AIMS (if indicated):    Assets:  Communication Skills Housing Social Support  ADL's:  Intact  Cognition: WNL  Sleep:        Allergies:  No Known Allergies Current Medications: Current Outpatient Prescriptions  Medication Sig Dispense Refill  . Albuterol Sulfate (VENTOLIN HFA IN) Inhale into the lungs as needed.    Marland Kitchen aspirin 81 MG tablet Take 81 mg by mouth daily.    Marland Kitchen donepezil (ARICEPT) 10 MG tablet Take 1 tablet (10 mg total) by mouth at bedtime. 90 tablet 2  . escitalopram (LEXAPRO) 10 MG tablet Take 1 tablet (10 mg total) by mouth daily. 90 tablet 4  . isosorbide mononitrate (IMDUR) 30 MG 24 hr tablet TAKE 1 TABLET BY MOUTH ONCE A DAY 90 tablet 3  . lamoTRIgine (LAMICTAL) 25 MG tablet Take 3 tablets (75 mg total) by mouth daily. 270 tablet 2  . levothyroxine (SYNTHROID, LEVOTHROID) 75 MCG tablet TAKE ONE TABLET BY MOUTH EVERY DAY 30 tablet 10  . LIALDA 1.2 g EC tablet TAKE 2 TABLETS BY MOUTH ONCE A DAY WITH BREAKFAST 60 tablet 5  . losartan (COZAAR) 50 MG tablet Take  1 tablet by mouth daily.    . Multiple Vitamins-Minerals (MULTIVITAMIN WITH MINERALS) tablet Take 1 tablet by mouth daily.    . nitroGLYCERIN (NITROSTAT) 0.4 MG SL tablet Place under the tongue.    . pantoprazole (PROTONIX) 40 MG tablet TAKE 1 TABLET BY MOUTH ONCE A DAY 90 tablet 3  . simvastatin (ZOCOR) 40 MG tablet Take 1 tablet by mouth daily.     No current facility-administered medications for this visit.     Previous Psychotropic Medications: Patient is currently taking Lexapro 10 mg daily. She has never tried a mood stabilizer in the past. She was recently started on Aricept by Dr. Manuella Ghazi neurologist. She is reporting some diarrhea as the adverse effects. She does not have any previous history of psychiatric hospitalization.  Substance Abuse History in the last 12 months:  No.  Consequences of Substance Abuse: Negative NA  Medical Decision Making:  Review of Psycho-Social Stressors (1) and Review and summation of old records (2)  Treatment Plan Summary: Medication management   Discussed with patient and her friend about the  medications treatment risk benefits and alternatives Continue  lamotrigine 75 mg by mouth daily. Advised them  about the side effects including risk of rash and Kathreen Cosier syndrome and she demonstrated understanding Continue  Lexapro 10  mg daily.  She will also continue on Aricept 10 mg daily.  Medications refilled for the 90 day supply.  Follow-up in 3  month   More than 50% of the time spent in psychoeducation, counseling and coordination of care.    This note was generated in part or whole with voice recognition software. Voice regonition is usually quite accurate but there are transcription errors that can and very often do occur. I apologize for any typographical errors that were not detected and corrected.     Rainey Pines, MD  3/21/201812:11 PM

## 2017-02-20 ENCOUNTER — Other Ambulatory Visit: Payer: Self-pay | Admitting: General Surgery

## 2017-02-20 DIAGNOSIS — Z85850 Personal history of malignant neoplasm of thyroid: Secondary | ICD-10-CM | POA: Diagnosis not present

## 2017-02-24 LAB — CEA: CEA: 21.6 ng/mL — ABNORMAL HIGH (ref 0.0–4.7)

## 2017-02-24 LAB — CALCITONIN: CALCITONIN: 178 pg/mL — AB (ref 0.0–5.0)

## 2017-02-26 ENCOUNTER — Other Ambulatory Visit: Payer: Self-pay | Admitting: General Surgery

## 2017-02-26 ENCOUNTER — Ambulatory Visit: Payer: Self-pay | Admitting: General Surgery

## 2017-03-06 ENCOUNTER — Inpatient Hospital Stay: Payer: Self-pay

## 2017-03-06 ENCOUNTER — Encounter: Payer: Self-pay | Admitting: General Surgery

## 2017-03-06 ENCOUNTER — Ambulatory Visit (INDEPENDENT_AMBULATORY_CARE_PROVIDER_SITE_OTHER): Payer: Medicare Other | Admitting: General Surgery

## 2017-03-06 VITALS — BP 140/80 | Resp 12 | Ht 62.0 in | Wt 137.0 lb

## 2017-03-06 DIAGNOSIS — Z85850 Personal history of malignant neoplasm of thyroid: Secondary | ICD-10-CM

## 2017-03-06 NOTE — Patient Instructions (Addendum)
Will follow up with patient by phone call within the next week regarding a plan.

## 2017-03-06 NOTE — Progress Notes (Signed)
Patient ID: Margaret Romero, female   DOB: 1946/05/13, 71 y.o.   MRN: [id number]  Chief Complaint  Patient presents with  . Follow-up    Thryoid-Labs    HPI Margaret Romero is a 71 y.o. female is here today for a thyroid follow up. Patient had CEA and Calcitonin done on 02/20/17. Patient states she is doing well.  HPI  Past Medical History:  Diagnosis Date  . Bipolar disorder ([place])   . GERD (gastroesophageal reflux disease)   . Heart disease   . Hypertension   . IBS (irritable bowel syndrome) 2000  . Malignant neoplasm of thyroid gland [facility]) January 23, 2011   medullary carcinoma thyroid T2, Nx.  Marland Kitchen MCI (mild cognitive impairment) 2017  . Occlusion and stenosis of carotid artery without mention of cerebral infarction     Past Surgical History:  Procedure Laterality Date  . CAROTID ENDARTERECTOMY Right November 2015   [name], MD  . [name]  2006  . COLONOSCOPY  2008  . CORONARY STENT PLACEMENT  2007  . HERNIA REPAIR  2007  . NASAL SINUS SURGERY  2008  . SKIN CANCER EXCISION    . THYROIDECTOMY  2012    Family History  Problem Relation Age of Onset  . Cancer Sister     Bone  . Cancer Brother     Bone  . Cancer Sister     Breast  . Cancer Sister     Colon     Social History Social History  Substance Use Topics  . Smoking status: Former Smoker    Packs/day: 1.00    Years: 10.00    Quit date: 12/01/1974  . Smokeless tobacco: Never Used  . Alcohol use No    No Known Allergies  Current Outpatient Prescriptions  Medication Sig Dispense Refill  . Albuterol Sulfate (VENTOLIN HFA IN) Inhale into the lungs as needed.    Marland Kitchen aspirin 81 MG tablet Take 81 mg by mouth daily.    Marland Kitchen donepezil (ARICEPT) 10 MG tablet Take 1 tablet (10 mg total) by mouth at bedtime. 90 tablet 2  . escitalopram (LEXAPRO) 10 MG tablet Take 1 tablet (10 mg total) by mouth daily. 90 tablet 4  . isosorbide mononitrate (IMDUR) 30 MG 24 hr tablet TAKE 1 TABLET BY MOUTH ONCE A DAY 90  tablet 3  . lamoTRIgine (LAMICTAL) 25 MG tablet Take 3 tablets (75 mg total) by mouth daily. 270 tablet 2  . levothyroxine (SYNTHROID, LEVOTHROID) 75 MCG tablet TAKE ONE TABLET BY MOUTH EVERY DAY 30 tablet 10  . LIALDA 1.2 g EC tablet TAKE 2 TABLETS BY MOUTH ONCE A DAY WITH BREAKFAST 60 tablet 5  . losartan (COZAAR) 50 MG tablet Take 1 tablet by mouth daily.    . Multiple Vitamins-Minerals (MULTIVITAMIN WITH MINERALS) tablet Take 1 tablet by mouth daily.    . nitroGLYCERIN (NITROSTAT) 0.4 MG SL tablet Place under the tongue.    . pantoprazole (PROTONIX) 40 MG tablet TAKE 1 TABLET BY MOUTH ONCE A DAY 90 tablet 3  . simvastatin (ZOCOR) 40 MG tablet Take 1 tablet by mouth daily.     No current facility-administered medications for this visit.     Review of Systems Review of Systems  Constitutional: Negative.   Respiratory: Negative.   Cardiovascular: Negative.     Blood pressure 140/80, resp. rate 12, height 5' 2"  (1.575 m), weight 137 lb (62.1 kg).  Physical Exam Physical Exam  Constitutional: She is oriented to person, place,  and time. She appears well-developed and well-nourished.  Eyes: Conjunctivae are normal. No scleral icterus.  Neck: Neck supple. No thyromegaly present.  Cardiovascular: Normal rate, regular rhythm and normal heart sounds.   Bruits noted over the left subclavian and left common carotid vessel.  Pulmonary/Chest: Effort normal and breath sounds normal.  Lymphadenopathy:       Right cervical: No superficial cervical, no deep cervical and no posterior cervical adenopathy present.      Left cervical: No superficial cervical, no deep cervical and no posterior cervical adenopathy present.    She has axillary adenopathy.       Right axillary: No pectoral adenopathy present.       Left axillary: No pectoral adenopathy present.      Right: No supraclavicular adenopathy present.       Left: No supraclavicular adenopathy present.  Neurological: She is alert and oriented  to person, place, and time.  Skin: Skin is warm and dry.    Data Reviewed Limited ultrasound examination of the neck showed no discernible nodal tissue. No evidence of residual thyroid tissue incidentally noted the right carotid is clear, extensive calcific plaque on the left.  Assessment    Rising calcitonin and CEA levels.    Plan    The patient's case was presented at the [facility] tumor board. The rising tumor markers suggest recurrent disease, but as the patient is asymptomatic and likely to be below the level of the taction by PET/CT(not beneficial with calcitonin levels low 500) and asymptomatic CT imaging is not indicated at this time.  The patient's goodfriend Flonnie Moize was advised of these results by phone after the meeting (the patient was out of the house at the time of my call).     Will follow up in 6 months with repeat CEA and calcitonin levels at that time. HPI, Physical Exam, Assessment and Plan have been scribed under the direction and in the presence of [name], MD.  [name], CMA    I have completed the exam and reviewed the above documentation for accuracy and completeness.  I agree with the above.  Haematologist has been used and any errors in dictation or transcription are unintentional.  [name], M.D., F.A.C.S.  [name] 03/06/2017, 1:36 PM

## 2017-03-12 ENCOUNTER — Other Ambulatory Visit: Payer: Self-pay | Admitting: Family Medicine

## 2017-03-12 MED ORDER — MESALAMINE 1.2 G PO TBEC: DELAYED_RELEASE_TABLET | ORAL | 5 refills | Status: DC

## 2017-03-12 NOTE — Telephone Encounter (Signed)
[facility] faxed a request for the following medication. Thanks CC  LIALDA 1.2 g EC tablet  Take 2 Tablet by mouth once a day with breakfast.

## 2017-03-24 ENCOUNTER — Ambulatory Visit (INDEPENDENT_AMBULATORY_CARE_PROVIDER_SITE_OTHER): Payer: Medicare Other | Admitting: Vascular Surgery

## 2017-03-24 ENCOUNTER — Encounter (INDEPENDENT_AMBULATORY_CARE_PROVIDER_SITE_OTHER): Payer: Medicare Other

## 2017-04-02 DIAGNOSIS — G3184 Mild cognitive impairment, so stated: Secondary | ICD-10-CM | POA: Diagnosis not present

## 2017-04-02 DIAGNOSIS — D320 Benign neoplasm of cerebral meninges: Secondary | ICD-10-CM | POA: Diagnosis not present

## 2017-04-09 ENCOUNTER — Encounter (INDEPENDENT_AMBULATORY_CARE_PROVIDER_SITE_OTHER): Payer: Self-pay | Admitting: Vascular Surgery

## 2017-04-09 ENCOUNTER — Ambulatory Visit (INDEPENDENT_AMBULATORY_CARE_PROVIDER_SITE_OTHER): Payer: Medicare Other | Admitting: Vascular Surgery

## 2017-04-09 ENCOUNTER — Other Ambulatory Visit (INDEPENDENT_AMBULATORY_CARE_PROVIDER_SITE_OTHER): Payer: Self-pay | Admitting: Vascular Surgery

## 2017-04-09 ENCOUNTER — Ambulatory Visit (INDEPENDENT_AMBULATORY_CARE_PROVIDER_SITE_OTHER): Payer: Medicare Other

## 2017-04-09 VITALS — BP 191/72 | HR 63 | Resp 16 | Ht 63.0 in | Wt 137.0 lb

## 2017-04-09 DIAGNOSIS — I6523 Occlusion and stenosis of bilateral carotid arteries: Secondary | ICD-10-CM

## 2017-04-09 DIAGNOSIS — I10 Essential (primary) hypertension: Secondary | ICD-10-CM | POA: Diagnosis not present

## 2017-04-09 DIAGNOSIS — E785 Hyperlipidemia, unspecified: Secondary | ICD-10-CM | POA: Diagnosis not present

## 2017-04-10 DIAGNOSIS — D320 Benign neoplasm of cerebral meninges: Secondary | ICD-10-CM | POA: Insufficient documentation

## 2017-05-20 DIAGNOSIS — I6523 Occlusion and stenosis of bilateral carotid arteries: Secondary | ICD-10-CM | POA: Insufficient documentation

## 2017-05-20 NOTE — Progress Notes (Signed)
Subjective:    Patient ID: Margaret Romero, female    DOB: [birth year], 71 y.o.   MRN: [id number] Chief Complaint  Patient presents with  . Carotid    1 year carotid ultrasound follow up   Patient presents for a yearly non-invasive study follow up for carotid stenosis. The stenosis has been followed by surveillance duplexes. The patient underwent a bilateral carotid duplex scan which showed no change from the previous exam on 02/29/16. Duplex is stable A patent right carotid endarterectomy and left ICA 40-59% stenosis. The patient denies experiencing Amaurosis Fugax, TIA like symptoms or focal motor deficits.    Review of Systems  Constitutional: Negative.   HENT: Negative.   Eyes: Negative.   Respiratory: Negative.   Cardiovascular: Negative.   Gastrointestinal: Negative.   Endocrine: Negative.   Genitourinary: Negative.   Musculoskeletal: Negative.   Skin: Negative.   Allergic/Immunologic: Negative.   Neurological: Negative.   Hematological: Negative.   Psychiatric/Behavioral: Negative.       Objective:   Physical Exam  Constitutional: She is oriented to person, place, and time. She appears well-developed and well-nourished. No distress.  HENT:  Head: Normocephalic and atraumatic.  Eyes: Conjunctivae are normal. Pupils are equal, round, and reactive to light.  Neck: Normal range of motion.  No carotid bruits noted  Cardiovascular: Normal rate, regular rhythm, normal heart sounds and intact distal pulses.   Pulses:      Radial pulses are 2+ on the right side, and 2+ on the left side.  Pulmonary/Chest: Effort normal.  Musculoskeletal: Normal range of motion. She exhibits no edema.  Neurological: She is alert and oriented to person, place, and time.  Skin: Skin is warm and dry. She is not diaphoretic.  Psychiatric: She has a normal mood and affect. Her behavior is normal. Judgment and thought content normal.    BP (!) 191/72 (BP Location: Left Arm)   Pulse 63   Resp 16    Ht 5' 3"  (1.6 m)   Wt 137 lb (62.1 kg)   BMI 24.27 kg/m   Past Medical History:  Diagnosis Date  . Bipolar disorder ([place])   . GERD (gastroesophageal reflux disease)   . Heart disease   . Hypertension   . IBS (irritable bowel syndrome) 2000  . Malignant neoplasm of thyroid gland [facility]) January 23, 2011   medullary carcinoma thyroid T2, Nx.  Marland Kitchen MCI (mild cognitive impairment) 2017  . Occlusion and stenosis of carotid artery without mention of cerebral infarction     Social History   Social History  . Marital status: Single    Spouse name: N/A  . Number of children: 1  . Years of education: N/A   Occupational History  . Retired    Social History Main Topics  . Smoking status: Former Smoker    Packs/day: 1.00    Years: 10.00    Quit date: 12/01/1974  . Smokeless tobacco: Never Used  . Alcohol use No  . Drug use: No  . Sexual activity: Not Currently   Other Topics Concern  . Not on file   Social History Narrative  . No narrative on file    Past Surgical History:  Procedure Laterality Date  . CAROTID ENDARTERECTOMY Right November 2015   [name], MD  . [name]  2006  . COLONOSCOPY  2008  . CORONARY STENT PLACEMENT  2007  . HERNIA REPAIR  2007  . NASAL SINUS SURGERY  2008  . SKIN CANCER EXCISION    .  THYROIDECTOMY  2012    Family History  Problem Relation Age of Onset  . Cancer Sister        Bone  . Cancer Brother        Bone  . Cancer Sister        Breast  . Cancer Sister        Colon     No Known Allergies     Assessment & Plan:  Patient presents for a yearly non-invasive study follow up for carotid stenosis. The stenosis has been followed by surveillance duplexes. The patient underwent a bilateral carotid duplex scan which showed no change from the previous exam on 02/29/16. Duplex is stable A patent right carotid endarterectomy and left ICA 40-59% stenosis. The patient denies experiencing Amaurosis Fugax, TIA like symptoms or  focal motor deficits.   1. Bilateral carotid artery stenosis - stable Studies reviewed with patient. Patient asymptomatic with stable duplex.  No intervention at this time.  Patient to return in 1 year for surveillance carotid duplex. I have discussed with the patient at length the risk factors for and pathogenesis of atherosclerotic disease and encouraged a healthy diet, regular exercise regimen and blood pressure / glucose control.  Patient was instructed to contact our office in the interim with problems such as arm / leg weakness or numbness, speech / swallowing difficulty or temporary monocular blindness. The patient expresses their understanding.   - VAS US CAROTID; Future  2. Hyperlipidemia, unspecified hyperlipidemia type - stable Encouraged good control as its slows the progression of atherosclerotic disease  3. Essential hypertension - stable Encouraged good control as its slows the progression of atherosclerotic disease  Current Outpatient Prescriptions on File Prior to Visit  Medication Sig Dispense Refill  . Albuterol Sulfate (VENTOLIN HFA IN) Inhale into the lungs as needed.    Marland Kitchen aspirin 81 MG tablet Take 81 mg by mouth daily.    Marland Kitchen donepezil (ARICEPT) 10 MG tablet Take 1 tablet (10 mg total) by mouth at bedtime. 90 tablet 2  . escitalopram (LEXAPRO) 10 MG tablet Take 1 tablet (10 mg total) by mouth daily. 90 tablet 4  . isosorbide mononitrate (IMDUR) 30 MG 24 hr tablet TAKE 1 TABLET BY MOUTH ONCE A DAY 90 tablet 3  . lamoTRIgine (LAMICTAL) 25 MG tablet Take 3 tablets (75 mg total) by mouth daily. 270 tablet 2  . levothyroxine (SYNTHROID, LEVOTHROID) 75 MCG tablet TAKE ONE TABLET BY MOUTH EVERY DAY 30 tablet 10  . losartan (COZAAR) 50 MG tablet Take 1 tablet by mouth daily.    . mesalamine (LIALDA) 1.2 g EC tablet TAKE 2 TABLETS BY MOUTH ONCE A DAY WITH BREAKFAST 60 tablet 5  . Multiple Vitamins-Minerals (MULTIVITAMIN WITH MINERALS) tablet Take 1 tablet by mouth daily.    .  nitroGLYCERIN (NITROSTAT) 0.4 MG SL tablet Place under the tongue.    . pantoprazole (PROTONIX) 40 MG tablet TAKE 1 TABLET BY MOUTH ONCE A DAY 90 tablet 3  . simvastatin (ZOCOR) 40 MG tablet Take 1 tablet by mouth daily.     No current facility-administered medications on file prior to visit.     There are no Patient Instructions on file for this visit. No Follow-up on file.   KIMBERLY A STEGMAYER, PA-C

## 2017-05-21 ENCOUNTER — Encounter: Payer: Self-pay | Admitting: Psychiatry

## 2017-05-21 ENCOUNTER — Ambulatory Visit (INDEPENDENT_AMBULATORY_CARE_PROVIDER_SITE_OTHER): Payer: 59 | Admitting: Psychiatry

## 2017-05-21 VITALS — BP 164/62 | HR 65 | Temp 97.9°F | Wt 137.4 lb

## 2017-05-21 DIAGNOSIS — R4189 Other symptoms and signs involving cognitive functions and awareness: Secondary | ICD-10-CM | POA: Diagnosis not present

## 2017-05-21 DIAGNOSIS — R4689 Other symptoms and signs involving appearance and behavior: Secondary | ICD-10-CM

## 2017-05-21 DIAGNOSIS — F3181 Bipolar II disorder: Secondary | ICD-10-CM

## 2017-05-21 MED ORDER — DONEPEZIL HCL 10 MG PO TABS: 10.0000 mg | ORAL_TABLET | Freq: Every day | ORAL | 2 refills | Status: DC

## 2017-05-21 MED ORDER — LAMOTRIGINE 25 MG PO TABS: 75.0000 mg | ORAL_TABLET | Freq: Every day | ORAL | 2 refills | Status: DC

## 2017-05-21 MED ORDER — ESCITALOPRAM OXALATE 10 MG PO TABS: 10.0000 mg | ORAL_TABLET | Freq: Every day | ORAL | 4 refills | Status: DC

## 2017-05-21 NOTE — Progress Notes (Signed)
Psychiatric MD Progress Note  Patient Identification: Margaret Romero MRN:  [id number] Date of Evaluation:  05/21/2017 Referral Source: PCP Chief Complaint:   Chief Complaint    Follow-up; Medication Refill     Visit Diagnosis:    ICD-10-CM   1. Bipolar 2 disorder (HCC) F31.81   2. Cognitive and behavioral changes R41.89    R46.89    Diagnosis:   Patient Active Problem List   Diagnosis Date Noted  . Bilateral carotid artery stenosis [I65.23] 05/20/2017  . Osteopenia [M85.80] 07/12/2016  . Nodular hyperplasia of liver [K76.89] 07/03/2016  . Splenomegaly [R16.1] 06/11/2016  . Thrombocytopenia ([place]) [D69.6] 06/11/2016  . Bipolar affective disorder ([place]) [F31.9] 01/22/2016  . Pancytopenia ([place]) [B02.111] 11/20/2015  . Skin lesion [L98.9] 11/20/2015  . Disease of skin and subcutaneous tissue [L98.9] 11/20/2015  . Body mass index (BMI) of 22.0-22.9 in adult [facility] 11/16/2015  . Cancer of thyroid ([place]) [C73] 11/16/2015  . Clinical depression [F32.9] 11/16/2015  . 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 Presence of stent in coronary artery [Z95.5] 11/16/2015  . HLD (hyperlipidemia) [E78.5] 11/16/2015  . Bad memory [R41.3] 11/16/2015  . Carpal bone fracture [S62.109A] 11/16/2015  . 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 CAD in native artery [I25.10] 11/16/2015  . Closed fracture carpal bone [S62.109A] 11/16/2015  . Diverticulitis [K57.92] 11/16/2015  . 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 Malignant neoplasm of thyroid gland ([place]) [C73] 11/16/2015  . Gastroenteritis, non-infectious [K52.9] 11/16/2015  . Carotid artery obstruction [I65.29] 11/16/2015  . History of cardiovascular surgery [Z98.890] 11/16/2015  . Acid reflux [K21.9] 08/14/2015  . Anxiety disorder [F41.9] 08/14/2015  . Hypertension [I10] 06/13/2015  . Essential (primary) hypertension [I10] 06/13/2015  . Ulcerative colitis ([place]) [K51.90] 06/01/2015  .  Fecal occult blood test positive [R19.5] 12/15/2014  . SOB (shortness of breath) on exertion [R06.02] 11/01/2014  . Hypoxemia [R09.02] 11/01/2014  . Breath shortness [R06.02] 11/01/2014  . Personal history of malignant neoplasm of thyroid [Z85.850] 01/13/2014   History of Present Illness:  Patient is a 71 year old female who presented for follow-up accompanied by her friend.  Her friend Flonnie, reported that [name]. 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 She Appeared pleasant and cooperative during the interview. She reported that she has been compliant with her medications and does not have any acute symptoms. She reported that she has her hair done today. She does not have any side effects of the medication. She denied having any suicidal homicidal ideations or plans.     Elements:  Severity:  mild. Associated Signs/Symptoms: Depression Symptoms:  fatigue, (Hypo) Manic Symptoms:  Labiality of Mood, Anxiety Symptoms:  none Psychotic Symptoms:  none PTSD Symptoms: Negative NA  Past Medical History:  Past Medical History:  Diagnosis Date  . Bipolar disorder ([place])   . GERD (gastroesophageal reflux disease)   . Heart disease   . Hypertension   . IBS (irritable bowel syndrome) 2000  . Malignant neoplasm of thyroid gland [facility]) January 23, 2011   medullary carcinoma thyroid T2, Nx.  Marland Kitchen MCI (mild cognitive impairment) 2017  . Occlusion and stenosis of carotid artery without mention of cerebral infarction     Past Surgical History:  Procedure Laterality Date  . CAROTID ENDARTERECTOMY Right November 2015   [name], MD  . [name]  2006  . COLONOSCOPY  2008  . CORONARY STENT PLACEMENT  2007  .  HERNIA REPAIR  2007  . NASAL SINUS SURGERY  2008  . SKIN CANCER EXCISION    . THYROIDECTOMY  2012   Family History:  Family History  Problem Relation Age of Onset  . Cancer Sister        Bone  . Cancer Brother         Bone  . Cancer Sister        Breast  . Cancer Sister        Colon    Social History:   Social History   Social History  . Marital status: Single    Spouse name: N/A  . Number of children: 1  . Years of education: N/A   Occupational History  . Retired    Social History Main Topics  . Smoking status: Former Smoker    Packs/day: 1.00    Years: 10.00    Quit date: 12/01/1974  . Smokeless tobacco: Never Used  . Alcohol use No  . Drug use: No  . Sexual activity: Not Currently   Other Topics Concern  . None   Social History Narrative  . None   Additional Social History:  Patient is currently divorced. She has a daughter and 5 grandchildren. [name].  Musculoskeletal: Strength & Muscle Tone: within normal limits Gait & Station: normal Patient leans: N/A  Psychiatric Specialty Exam: Medication Refill     ROS  Blood pressure (!) 164/62, pulse 65, temperature 97.9 F (36.6 C), temperature source Oral, weight 137 lb 6.4 oz (62.3 kg).Body mass index is 24.34 kg/m.  General Appearance: Casual  Eye Contact:  Fair  Speech:  Clear and Coherent  Volume:  Normal  Mood:  happy  Affect:  Congruent  Thought Process:  Goal Directed and Logical  Orientation:  Full (Time, Place, and Person)  Thought Content:  WDL  Suicidal Thoughts:  No  Homicidal Thoughts:  No  Memory:  impaired  Judgement:  Poor  Insight:  Lacking  Psychomotor Activity:  Normal  Concentration:  Poor  Recall:  Poor  Fund of Knowledge:Fair  Language: Fair  Akathisia:  No  Handed:  Right  AIMS (if indicated):    Assets:  Communication Skills Housing Social Support  ADL's:  Intact  Cognition: WNL  Sleep:       Allergies:  No Known Allergies Current Medications: Current Outpatient Prescriptions  Medication Sig Dispense Refill  . Albuterol Sulfate (VENTOLIN HFA IN) Inhale into the lungs as needed.    Marland Kitchen aspirin 81 MG tablet Take 81 mg by mouth  daily.    Marland Kitchen escitalopram (LEXAPRO) 10 MG tablet Take 1 tablet (10 mg total) by mouth daily. 90 tablet 4  . isosorbide mononitrate (IMDUR) 30 MG 24 hr tablet TAKE 1 TABLET BY MOUTH ONCE A DAY 90 tablet 3  . lamoTRIgine (LAMICTAL) 25 MG tablet Take 3 tablets (75 mg total) by mouth daily. 270 tablet 2  . levothyroxine (SYNTHROID, LEVOTHROID) 75 MCG tablet TAKE ONE TABLET BY MOUTH EVERY DAY 30 tablet 10  . losartan (COZAAR) 50 MG tablet Take 1 tablet by mouth daily.    . mesalamine (LIALDA) 1.2 g EC tablet TAKE 2 TABLETS BY MOUTH ONCE A DAY WITH BREAKFAST 60 tablet 5  . Multiple Vitamins-Minerals (MULTIVITAMIN WITH MINERALS) tablet Take 1 tablet by mouth daily.    . nitroGLYCERIN (NITROSTAT) 0.4 MG SL tablet Place under the tongue.    . pantoprazole (PROTONIX) 40 MG tablet TAKE 1  TABLET BY MOUTH ONCE A DAY 90 tablet 3  . simvastatin (ZOCOR) 40 MG tablet Take 1 tablet by mouth daily.    Marland Kitchen donepezil (ARICEPT) 10 MG tablet Take 1 tablet (10 mg total) by mouth at bedtime. 90 tablet 2   No current facility-administered medications for this visit.     Previous Psychotropic Medications: Patient is currently taking Lexapro 10 mg daily. She has never tried a mood stabilizer in the past. She was recently started on Aricept by Dr. Manuella Ghazi neurologist. She is reporting some diarrhea as the adverse effects. She does not have any previous history of psychiatric hospitalization.  Substance Abuse History in the last 12 months:  No.  Consequences of Substance Abuse: Negative NA  Medical Decision Making:  Review of Psycho-Social Stressors (1) and Review and summation of old records (2)  Treatment Plan Summary: Medication management   Discussed with patient and her friend about the medications treatment risk benefits and alternatives Continue  lamotrigine 75 mg by mouth daily. Advised them  about the side effects including risk of rash and Kathreen Cosier syndrome and she demonstrated understanding Continue   Lexapro 10  mg daily.  She will also continue on Aricept 10 mg daily.  Medications refilled for the 90 day supply with refills  Follow-up in 6  month   More than 50% of the time spent in psychoeducation, counseling and coordination of care.    This note was generated in part or whole with voice recognition software. Voice regonition is usually quite accurate but there are transcription errors that can and very often do occur. I apologize for any typographical errors that were not detected and corrected.     Rainey Pines, MD  6/20/201811:42 AM

## 2017-06-09 ENCOUNTER — Other Ambulatory Visit: Payer: Self-pay | Admitting: Family Medicine

## 2017-06-09 DIAGNOSIS — I10 Essential (primary) hypertension: Secondary | ICD-10-CM

## 2017-06-09 NOTE — Telephone Encounter (Signed)
[facility] faxed a request on the following medication. Thanks CC  isosorbide mononitrate (IMDUR) 30 MG 24 hr tablet  >Take 1 tablet by mouth once a day.

## 2017-06-10 MED ORDER — ISOSORBIDE MONONITRATE ER 30 MG PO TB24: 30.0000 mg | ORAL_TABLET | Freq: Every day | ORAL | 1 refills | Status: DC

## 2017-06-10 NOTE — Telephone Encounter (Signed)
Please review. Thanks!  

## 2017-06-25 ENCOUNTER — Ambulatory Visit (INDEPENDENT_AMBULATORY_CARE_PROVIDER_SITE_OTHER): Payer: Medicare Other

## 2017-06-25 ENCOUNTER — Ambulatory Visit (INDEPENDENT_AMBULATORY_CARE_PROVIDER_SITE_OTHER): Payer: Medicare Other | Admitting: Family Medicine

## 2017-06-25 ENCOUNTER — Encounter: Payer: Self-pay | Admitting: Family Medicine

## 2017-06-25 VITALS — BP 140/70 | HR 86 | Temp 98.1°F | Ht 63.0 in | Wt 137.2 lb

## 2017-06-25 DIAGNOSIS — I2510 Atherosclerotic heart disease of native coronary artery without angina pectoris: Secondary | ICD-10-CM

## 2017-06-25 DIAGNOSIS — Z0000 Encounter for general adult medical examination without abnormal findings: Secondary | ICD-10-CM

## 2017-06-25 DIAGNOSIS — Z23 Encounter for immunization: Secondary | ICD-10-CM | POA: Diagnosis not present

## 2017-06-25 DIAGNOSIS — M8580 Other specified disorders of bone density and structure, unspecified site: Secondary | ICD-10-CM

## 2017-06-25 DIAGNOSIS — K589 Irritable bowel syndrome without diarrhea: Secondary | ICD-10-CM

## 2017-06-25 DIAGNOSIS — K219 Gastro-esophageal reflux disease without esophagitis: Secondary | ICD-10-CM

## 2017-06-25 DIAGNOSIS — C73 Malignant neoplasm of thyroid gland: Secondary | ICD-10-CM

## 2017-06-25 DIAGNOSIS — I6523 Occlusion and stenosis of bilateral carotid arteries: Secondary | ICD-10-CM

## 2017-06-25 DIAGNOSIS — D696 Thrombocytopenia, unspecified: Secondary | ICD-10-CM

## 2017-06-25 DIAGNOSIS — E785 Hyperlipidemia, unspecified: Secondary | ICD-10-CM | POA: Diagnosis not present

## 2017-06-25 DIAGNOSIS — R1909 Other intra-abdominal and pelvic swelling, mass and lump: Secondary | ICD-10-CM

## 2017-06-25 DIAGNOSIS — I10 Essential (primary) hypertension: Secondary | ICD-10-CM | POA: Diagnosis not present

## 2017-06-25 NOTE — Patient Instructions (Signed)
Ms. Margaret Romero , Thank you for taking time to come for your Medicare Wellness Visit. I appreciate your ongoing commitment to your health goals. Please review the following plan we discussed and let me know if I can assist you in the future.   Screening recommendations/referrals: Colonoscopy: completed 11/20/15 Mammogram: declined Bone Density: completed 07/11/16 Recommended yearly ophthalmology/optometry visit for glaucoma screening and checkup Recommended yearly dental visit for hygiene and checkup  Vaccinations: Influenza vaccine: due 08/2017 Pneumococcal vaccine: Prevnar 13 given 06/11/16,  Tdap vaccine: declined Shingles vaccine: declined   Advanced directives: Advance directive discussed with you today. I have provided a copy for you to complete at home and have notarized. Once this is complete please bring a copy in to our office so we can scan it into your chart.  Conditions/risks identified: Continue drinking 6-8 glasses of water a day.    Next appointment: None, need to schedule 1 year AWV   Preventive Care 26 Years and Older, Female Preventive care refers to lifestyle choices and visits with your health care provider that can promote health and wellness. What does preventive care include?  A yearly physical exam. This is also called an annual well check.  Dental exams once or twice a year.  Routine eye exams. Ask your health care provider how often you should have your eyes checked.  Personal lifestyle choices, including:  Daily care of your teeth and gums.  Regular physical activity.  Eating a healthy diet.  Avoiding tobacco and drug use.  Limiting alcohol use.  Practicing safe sex.  Taking low-dose aspirin every day.  Taking vitamin and mineral supplements as recommended by your health care provider. What happens during an annual well check? The services and screenings done by your health care provider during your annual well check will depend on your age,  overall health, lifestyle risk factors, and family history of disease. Counseling  Your health care provider may ask you questions about your:  Alcohol use.  Tobacco use.  Drug use.  Emotional well-being.  Home and relationship well-being.  Sexual activity.  Eating habits.  History of falls.  Memory and ability to understand (cognition).  Work and work Statistician.  Reproductive health. Screening  You may have the following tests or measurements:  Height, weight, and BMI.  Blood pressure.  Lipid and cholesterol levels. These may be checked every 5 years, or more frequently if you are over 63 years old.  Skin check.  Lung cancer screening. You may have this screening every year starting at age 69 if you have a 30-pack-year history of smoking and currently smoke or have quit within the past 15 years.  Fecal occult blood test (FOBT) of the stool. You may have this test every year starting at age 41.  Flexible sigmoidoscopy or colonoscopy. You may have a sigmoidoscopy every 5 years or a colonoscopy every 10 years starting at age 47.  Hepatitis C blood test.  Hepatitis B blood test.  Sexually transmitted disease (STD) testing.  Diabetes screening. This is done by checking your blood sugar (glucose) after you have not eaten for a while (fasting). You may have this done every 1-3 years.  Bone density scan. This is done to screen for osteoporosis. You may have this done starting at age 74.  Mammogram. This may be done every 1-2 years. Talk to your health care provider about how often you should have regular mammograms. Talk with your health care provider about your test results, treatment options, and if necessary,  the need for more tests. Vaccines  Your health care provider may recommend certain vaccines, such as:  Influenza vaccine. This is recommended every year.  Tetanus, diphtheria, and acellular pertussis (Tdap, Td) vaccine. You may need a Td booster every 10  years.  Zoster vaccine. You may need this after age 66.  Pneumococcal 13-valent conjugate (PCV13) vaccine. One dose is recommended after age 8.  Pneumococcal polysaccharide (PPSV23) vaccine. One dose is recommended after age 75. Talk to your health care provider about which screenings and vaccines you need and how often you need them. This information is not intended to replace advice given to you by your health care provider. Make sure you discuss any questions you have with your health care provider. Document Released: 12/15/2015 Document Revised: 08/07/2016 Document Reviewed: 09/19/2015 Elsevier Interactive Patient Education  2017 [place] Prevention in the Home Falls can cause injuries. They can happen to people of all ages. There are many things you can do to make your home safe and to help prevent falls. What can I do on the outside of my home?  Regularly fix the edges of walkways and driveways and fix any cracks.  Remove anything that might make you trip as you walk through a door, such as a raised step or threshold.  Trim any bushes or trees on the path to your home.  Use bright outdoor lighting.  Clear any walking paths of anything that might make someone trip, such as rocks or tools.  Regularly check to see if handrails are loose or broken. Make sure that both sides of any steps have handrails.  Any raised decks and porches should have guardrails on the edges.  Have any leaves, snow, or ice cleared regularly.  Use sand or salt on walking paths during winter.  Clean up any spills in your garage right away. This includes oil or grease spills. What can I do in the bathroom?  Use night lights.  Install grab bars by the toilet and in the tub and shower. Do not use towel bars as grab bars.  Use non-skid mats or decals in the tub or shower.  If you need to sit down in the shower, use a plastic, non-slip stool.  Keep the floor dry. Clean up any water that  spills on the floor as soon as it happens.  Remove soap buildup in the tub or shower regularly.  Attach bath mats securely with double-sided non-slip rug tape.  Do not have throw rugs and other things on the floor that can make you trip. What can I do in the bedroom?  Use night lights.  Make sure that you have a light by your bed that is easy to reach.  Do not use any sheets or blankets that are too big for your bed. They should not hang down onto the floor.  Have a firm chair that has side arms. You can use this for support while you get dressed.  Do not have throw rugs and other things on the floor that can make you trip. What can I do in the kitchen?  Clean up any spills right away.  Avoid walking on wet floors.  Keep items that you use a lot in easy-to-reach places.  If you need to reach something above you, use a strong step stool that has a grab bar.  Keep electrical cords out of the way.  Do not use floor polish or wax that makes floors slippery. If you must use  wax, use non-skid floor wax.  Do not have throw rugs and other things on the floor that can make you trip. What can I do with my stairs?  Do not leave any items on the stairs.  Make sure that there are handrails on both sides of the stairs and use them. Fix handrails that are broken or loose. Make sure that handrails are as long as the stairways.  Check any carpeting to make sure that it is firmly attached to the stairs. Fix any carpet that is loose or worn.  Avoid having throw rugs at the top or bottom of the stairs. If you do have throw rugs, attach them to the floor with carpet tape.  Make sure that you have a light switch at the top of the stairs and the bottom of the stairs. If you do not have them, ask someone to add them for you. What else can I do to help prevent falls?  Wear shoes that:  Do not have high heels.  Have rubber bottoms.  Are comfortable and fit you well.  Are closed at the  toe. Do not wear sandals.  If you use a stepladder:  Make sure that it is fully opened. Do not climb a closed stepladder.  Make sure that both sides of the stepladder are locked into place.  Ask someone to hold it for you, if possible.  Clearly mark and make sure that you can see:  Any grab bars or handrails.  First and last steps.  Where the edge of each step is.  Use tools that help you move around (mobility aids) if they are needed. These include:  Canes.  Walkers.  Scooters.  Crutches.  Turn on the lights when you go into a dark area. Replace any light bulbs as soon as they burn out.  Set up your furniture so you have a clear path. Avoid moving your furniture around.  If any of your floors are uneven, fix them.  If there are any pets around you, be aware of where they are.  Review your medicines with your doctor. Some medicines can make you feel dizzy. This can increase your chance of falling. Ask your doctor what other things that you can do to help prevent falls. This information is not intended to replace advice given to you by your health care provider. Make sure you discuss any questions you have with your health care provider. Document Released: 09/14/2009 Document Revised: 04/25/2016 Document Reviewed: 12/23/2014 Elsevier Interactive Patient Education  2017 [employer].

## 2017-06-25 NOTE — Progress Notes (Signed)
Subjective:   Margaret Romero is a 71 y.o. female who presents for Medicare Annual (Subsequent) preventive examination.  Review of Systems:  N/A  Cardiac Risk Factors include: advanced age (>20mn, >>75women);dyslipidemia;hypertension     Objective:     Vitals: BP 140/70 (BP Location: Left Arm)   Pulse 86   Temp 98.1 F (36.7 C) (Oral)   Ht 5' 3"  (1.6 m)   Wt 137 lb 3.2 oz (62.2 kg)   BMI 24.30 kg/m   Body mass index is 24.3 kg/m.   Tobacco History  Smoking Status  . Former Smoker  . Packs/day: 1.00  . Years: 10.00  . Quit date: 12/01/1974  Smokeless Tobacco  . Never Used     Counseling given: Not Answered   Past Medical History:  Diagnosis Date  . Closed fracture carpal bone 11/16/2015  . Malignant neoplasm of thyroid gland ([facility] January 23, 2011   medullary carcinoma thyroid T2, Nx.   Past Surgical History:  Procedure Laterality Date  . CAROTID ENDARTERECTOMY Right November 2015   GHortencia Pilar MD  . [facility] 2006  . COLONOSCOPY  2008  . CORONARY STENT PLACEMENT  2007  . HERNIA REPAIR  2007  . NASAL SINUS SURGERY  2008  . SKIN CANCER EXCISION    . THYROIDECTOMY  2012   Family History  Problem Relation Age of Onset  . Cancer Sister        Bone  . Cancer Brother        Bone  . Cancer Sister        Breast  . Cancer Sister        Colon   . Heart attack Daughter    History  Sexual Activity  . Sexual activity: Not Currently    Outpatient Encounter Prescriptions as of 06/25/2017  Medication Sig  . Albuterol Sulfate (VENTOLIN HFA IN) Inhale into the lungs as needed.  .Marland Kitchenaspirin 81 MG tablet Take 81 mg by mouth daily.  .Marland Kitchendonepezil (ARICEPT) 10 MG tablet Take 1 tablet (10 mg total) by mouth at bedtime.  .Marland Kitchenescitalopram (LEXAPRO) 10 MG tablet Take 1 tablet (10 mg total) by mouth daily.  . isosorbide mononitrate (IMDUR) 30 MG 24 hr tablet Take 1 tablet (30 mg total) by mouth daily.  .Marland KitchenlamoTRIgine (LAMICTAL) 25 MG tablet Take 3 tablets  (75 mg total) by mouth daily.  .Marland Kitchenlevothyroxine (SYNTHROID, LEVOTHROID) 75 MCG tablet TAKE ONE TABLET BY MOUTH EVERY DAY  . losartan (COZAAR) 50 MG tablet Take 1 tablet by mouth daily.  . mesalamine (LIALDA) 1.2 g EC tablet TAKE 2 TABLETS BY MOUTH ONCE A DAY WITH BREAKFAST  . Multiple Vitamins-Minerals (MULTIVITAMIN WITH MINERALS) tablet Take 1 tablet by mouth daily.  . nitroGLYCERIN (NITROSTAT) 0.4 MG SL tablet Place under the tongue.  . pantoprazole (PROTONIX) 40 MG tablet TAKE 1 TABLET BY MOUTH ONCE A DAY  . simvastatin (ZOCOR) 40 MG tablet Take 1 tablet by mouth daily.   No facility-administered encounter medications on file as of 06/25/2017.     Activities of Daily Living In your present state of health, do you have any difficulty performing the following activities: 06/25/2017  Hearing? N  Vision? N  Difficulty concentrating or making decisions? Y  Walking or climbing stairs? N  Dressing or bathing? N  Doing errands, shopping? N  Preparing Food and eating ? N  Using the Toilet? N  In the past six months, have you accidently leaked urine? N  Do you have problems with loss of bowel control? N  Managing your Medications? N  Managing your Finances? N  Housekeeping or managing your Housekeeping? N  Some recent data might be hidden    Patient Care Team: Birdie Sons, MD as PCP - General (Family Medicine) Bary Castilla, Forest Gleason, MD (General Surgery) Schnier, Dolores Lory, MD (Vascular Surgery) Rainey Pines, MD as Referring Physician (Psychiatry) Isaias Cowman, MD as Consulting Physician (Cardiology) Vladimir Crofts, MD as Consulting Physician (Neurology) Jannet Mantis, MD as Consulting Physician (Dermatology)    Assessment:      Exercise Activities and Dietary recommendations Exercise limited by: None identified  Goals    . Continue water intake          Continue drinking 6-8 glasses of water a day.       Fall Risk Fall Risk  06/25/2017 06/11/2016  Falls in  the past year? No No   Depression Screen PHQ 2/9 Scores 06/25/2017 06/25/2017 06/11/2016  PHQ - 2 Score 0 0 1  PHQ- 9 Score 0 - 3     Cognitive Function     6CIT Screen 06/25/2017  What Year? 4 points  What month? 0 points  What time? 0 points  Count back from 20 2 points  Months in reverse 4 points  Repeat phrase 10 points  Total Score 20    Immunization History  Administered Date(s) Administered  . Influenza,inj,Quad PF,36+ Mos 09/01/2014  . Influenza-Unspecified 09/01/2013, 09/01/2015  . Pneumococcal Conjugate-13 06/11/2016  . Pneumococcal Polysaccharide-23 06/25/2017   Screening Tests Health Maintenance  Topic Date Due  . MAMMOGRAM  12/02/2026 (Originally 10/14/2008)  . TETANUS/TDAP  12/02/2026 (Originally 06/16/1965)  . INFLUENZA VACCINE  07/02/2017  . DEXA SCAN  07/12/2019  . COLONOSCOPY  11/19/2025  . Hepatitis C Screening  Completed  . PNA vac Low Risk Adult  Completed      Plan:  I have personally reviewed and addressed the Medicare Annual Wellness questionnaire and have noted the following in the patient's chart:  A. Medical and social history B. Use of alcohol, tobacco or illicit drugs  C. Current medications and supplements D. Functional ability and status E.  Nutritional status F.  Physical activity G. Advance directives H. List of other physicians I.  Hospitalizations, surgeries, and ER visits in previous 12 months J.  [place] such as hearing and vision if needed, cognitive and depression L. Referrals and appointments - none  In addition, I have reviewed and discussed with patient certain preventive protocols, quality metrics, and best practice recommendations. A written personalized care plan for preventive services as well as general preventive health recommendations were provided to patient.  See attached scanned questionnaire for additional information.   Signed,  Fabio Neighbors, LPN Nurse Health Advisor   MD  Recommendations: Pt declined mammogram and tetanus vaccine today.

## 2017-06-25 NOTE — Progress Notes (Signed)
Patient: Margaret Romero, Female    DOB: [birth year], 71 y.o.   MRN: [id number] Visit Date: 06/25/2017  Today's Provider: Lelon Huh, MD   No chief complaint on file.  Subjective:    Annual physical exam Margaret Romero KINDLEY is a 71 y.o. female who presents today for health maintenance and complete physical. She feels well. She reports exercising yes/yard work. She reports she is sleeping well.  ----------------------------------------------------------------   Hypertension, follow-up:  BP Readings from Last 3 Encounters:  04/09/17 (!) 191/72  03/06/17 140/80  08/21/16 124/66    She was last seen for hypertension 1 years ago.  BP at that visit was 144/60. Management since that visit includes; no changes.She reports good compliance with treatment. She is not having side effects. none She is exercising. She is adherent to low salt diet.   Outside blood pressures are not checking. She is experiencing none.  Patient denies none.   Cardiovascular risk factors include none.  Use of agents associated with hypertension: none.   ----------------------------------------------------------------    Lipid/Cholesterol, Follow-up:   Last seen for this 1 years ago.  Management since that visit includes; no changes.  Last Lipid Panel:    Component Value Date/Time   CHOL 143 06/12/2016 0814   TRIG 93 06/12/2016 0814   HDL 51 06/12/2016 0814   CHOLHDL 2.8 06/12/2016 0814   CHOLHDL 2.8 Ratio 07/01/2008 2018   VLDL 32 07/01/2008 2018   LDLCALC 73 06/12/2016 0814    She reports good compliance with treatment. She is not having side effects. none  Wt Readings from Last 3 Encounters:  04/09/17 137 lb (62.1 kg)  03/06/17 137 lb (62.1 kg)  08/21/16 138 lb (62.6 kg)    ----------------------------------------------------------------  Coronary artery disease involving native coronary artery of native heart without angina pectoris From 06/11/2016-no changes. Continue  routine follow up with Dr. Saralyn Pilar  Cancer of thyroid [facility]) From 06/11/2016-no changes. Followed by Dr. Bary Castilla.  Only complaint today is that she noticed swelling in right inguinal area a few weeks ago, it not painful, not red, no discharge.     Review of Systems  Constitutional: Negative for chills, diaphoresis, fatigue and fever.  HENT: Negative for congestion.   Respiratory: Negative for cough.   Cardiovascular: Negative for chest pain, palpitations and leg swelling.  All other systems reviewed and are negative.   Social History      She  reports that she quit smoking about 42 years ago. She has a 10.00 pack-year smoking history. She has never used smokeless tobacco. She reports that she does not drink alcohol or use drugs.       Social History   Social History  . Marital status: Single    Spouse name: N/A  . Number of children: 1  . Years of education: N/A   Occupational History  . Retired    Social History Main Topics  . Smoking status: Former Smoker    Packs/day: 1.00    Years: 10.00    Quit date: 12/01/1974  . Smokeless tobacco: Never Used  . Alcohol use No  . Drug use: No  . Sexual activity: Not Currently   Other Topics Concern  . Not on file   Social History Narrative  . No narrative on file    Past Medical History:  Diagnosis Date  . Bipolar disorder ([place])   . GERD (gastroesophageal reflux disease)   . Heart disease   . Hypertension   . IBS (  irritable bowel syndrome) 2000  . Malignant neoplasm of thyroid gland [facility]) January 23, 2011   medullary carcinoma thyroid T2, Nx.  Marland Kitchen MCI (mild cognitive impairment) 2017  . Occlusion and stenosis of carotid artery without mention of cerebral infarction      Patient Active Problem List   Diagnosis Date Noted  . Bilateral carotid artery stenosis 05/20/2017  . Osteopenia 07/12/2016  . Nodular hyperplasia of liver 07/03/2016  . Splenomegaly 06/11/2016  . Thrombocytopenia ([place]) 06/11/2016  . Bipolar  affective disorder ([place]) 01/22/2016  . Pancytopenia ([place]) 11/20/2015  . Skin lesion 11/20/2015  . Disease of skin and subcutaneous tissue 11/20/2015  . Body mass index (BMI) of 22.0-22.9 in adult 11/16/2015  . Cancer of thyroid ([place]) 11/16/2015  . Clinical depression 11/16/2015  . 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 Presence of stent in coronary artery 11/16/2015  . HLD (hyperlipidemia) 11/16/2015  . Bad memory 11/16/2015  . Carpal bone fracture 11/16/2015  . 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 CAD in native artery 11/16/2015  . Closed fracture carpal bone 11/16/2015  . Diverticulitis 11/16/2015  . Major depressive disorder with single episode 11/16/2015  . Malignant neoplasm of thyroid gland ([place]) 11/16/2015  . Gastroenteritis, non-infectious 11/16/2015  . Carotid artery obstruction 11/16/2015  . History of cardiovascular surgery 11/16/2015  . Acid reflux 08/14/2015  . Anxiety disorder 08/14/2015  . Hypertension 06/13/2015  . Essential (primary) hypertension 06/13/2015  . Ulcerative colitis ([place]) 06/01/2015  . Fecal occult blood test positive 12/15/2014  . SOB (shortness of breath) on exertion 11/01/2014  . Hypoxemia 11/01/2014  . Breath shortness 11/01/2014  . Personal history of malignant neoplasm of thyroid 01/13/2014    Past Surgical History:  Procedure Laterality Date  . CAROTID ENDARTERECTOMY Right November 2015   [name], MD  . [name]  2006  . COLONOSCOPY  2008  . CORONARY STENT PLACEMENT  2007  . HERNIA REPAIR  2007  . NASAL SINUS SURGERY  2008  . SKIN CANCER EXCISION    . THYROIDECTOMY  2012    Family History        Family Status  Relation Status  . Sister Deceased  . Brother Deceased  . Mother Deceased  . Father Deceased  . Sister Alive  . Sister Alive  . Daughter Alive        Her family history includes Cancer in her brother, sister, sister, and sister; Heart attack in her daughter.     No Known  Allergies   Current Outpatient Prescriptions:  .  Albuterol Sulfate (VENTOLIN HFA IN), Inhale into the lungs as needed., Disp: , Rfl:  .  aspirin 81 MG tablet, Take 81 mg by mouth daily., Disp: , Rfl:  .  donepezil (ARICEPT) 10 MG tablet, Take 1 tablet (10 mg total) by mouth at bedtime., Disp: 90 tablet, Rfl: 2 .  escitalopram (LEXAPRO) 10 MG tablet, Take 1 tablet (10 mg total) by mouth daily., Disp: 90 tablet, Rfl: 4 .  isosorbide mononitrate (IMDUR) 30 MG 24 hr tablet, Take 1 tablet (30 mg total) by mouth daily., Disp: 90 tablet, Rfl: 1 .  lamoTRIgine (LAMICTAL) 25 MG tablet, Take 3 tablets (75 mg total) by mouth daily., Disp: 270 tablet, Rfl: 2 .  levothyroxine (SYNTHROID, LEVOTHROID) 75 MCG tablet, TAKE ONE TABLET BY MOUTH EVERY DAY, Disp: 30 tablet, Rfl: 10 .  losartan (COZAAR) 50 MG tablet, Take 1 tablet by mouth daily., Disp: , Rfl:  .  mesalamine (LIALDA) 1.2  g EC tablet, TAKE 2 TABLETS BY MOUTH ONCE A DAY WITH BREAKFAST, Disp: 60 tablet, Rfl: 5 .  Multiple Vitamins-Minerals (MULTIVITAMIN WITH MINERALS) tablet, Take 1 tablet by mouth daily., Disp: , Rfl:  .  nitroGLYCERIN (NITROSTAT) 0.4 MG SL tablet, Place under the tongue., Disp: , Rfl:  .  pantoprazole (PROTONIX) 40 MG tablet, TAKE 1 TABLET BY MOUTH ONCE A DAY, Disp: 90 tablet, Rfl: 3 .  simvastatin (ZOCOR) 40 MG tablet, Take 1 tablet by mouth daily., Disp: , Rfl:    Patient Care Team: Birdie Sons, MD as PCP - General (Family Medicine) Byrnett, Forest Gleason, MD (General Surgery) Schnier, Dolores Lory, MD (Vascular Surgery) Rainey Pines, MD as Referring Physician (Psychiatry) Marval Regal, NP as Nurse Practitioner (Nurse Practitioner) Isaias Cowman, MD as Consulting Physician (Cardiology)      Objective:   Vitals: There were no vitals taken for this visit.  There were no vitals filed for this visit.   Physical Exam   General Appearance:    Alert, cooperative, no distress, appears stated age, kyphotic  Head:     Normocephalic, without obvious abnormality, atraumatic  Eyes:    PERRL, conjunctiva/corneas clear, EOM's intact, fundi    benign, both eyes  Ears:    Normal TM's and external ear canals, both ears  Nose:   Nares normal, septum midline, mucosa normal, no drainage    or sinus tenderness  Throat:   Lips, mucosa, and tongue normal; teeth and gums normal  Neck:   Supple, symmetrical, trachea midline, no adenopathy;    thyroid:  no enlargement/tenderness/nodules; no carotid   bruit or JVD  Back:     Symmetric, no curvature, ROM normal, no CVA tenderness  Lungs:     Clear to auscultation bilaterally, respirations unlabored  Chest Wall:    No tenderness or deformity   Heart:    Regular rate and rhythm, S1 and S2 normal, no murmur, rub   or gallop  Breast Exam:    deferred  Abdomen:     Soft, non-tender, bowel sounds active all four quadrants,    no masses, no organomegaly. Semisolid plumb sized non-tender subcutaneous mass right inguinal area.   Pelvic:    deferred  Extremities:   Extremities normal, atraumatic, no cyanosis or edema  Pulses:   2+ and symmetric all extremities  Skin:   Skin color, texture, turgor normal, no rashes or lesions  Lymph nodes:   Cervical, supraclavicular, and axillary nodes normal  Neurologic:   CNII-XII intact, normal strength, sensation and reflexes    throughout    Depression Screen PHQ 2/9 Scores 06/25/2017 06/11/2016  PHQ - 2 Score 0 1  PHQ- 9 Score - 3      Assessment & Plan:     Routine Health Maintenance and Physical Exam  Exercise Activities and Dietary recommendations Goals    . Continue water intake          Continue drinking 6-8 glasses of water a day.        Immunization History  Administered Date(s) Administered  . Influenza,inj,Quad PF,36+ Mos 09/01/2014  . Influenza-Unspecified 09/01/2013, 09/01/2015  . Pneumococcal Conjugate-13 06/11/2016    Health Maintenance  Topic Date Due  . TETANUS/TDAP  06/16/1963  . MAMMOGRAM   10/14/2008  . PNA vac Low Risk Adult (2 of 2 - PPSV23) 06/11/2017  . INFLUENZA VACCINE  07/02/2017  . DEXA SCAN  07/12/2019  . COLONOSCOPY  11/19/2025  . Hepatitis C Screening  Completed  Discussed health benefits of physical activity, and encouraged her to engage in regular exercise appropriate for her age and condition.    --------------------------------------------------------------------  1. Annual physical exam   2. Irritable bowel syndrome, unspecified type   3. Gastroesophageal reflux disease, esophagitis presence not specified   4. Essential hypertension   5. CAD in native artery   6. Bilateral carotid artery stenosis   7. Cancer of thyroid ([place])  - TSH  8. Osteopenia, unspecified location   9. Hyperlipidemia, unspecified hyperlipidemia type  - Comprehensive metabolic panel - Lipid panel  10. Thrombocytopenia (HCC)  - CBC  11. Mass of right inguinal region New - [place] Misc Soft Tissue; Future    Lelon Huh, MD  [facility]

## 2017-06-26 LAB — COMPREHENSIVE METABOLIC PANEL
ALBUMIN: 3.8 g/dL (ref 3.5–4.8)
ALT: 14 IU/L (ref 0–32)
AST: 36 IU/L (ref 0–40)
Albumin/Globulin Ratio: 1.7 (ref 1.2–2.2)
Alkaline Phosphatase: 139 IU/L — ABNORMAL HIGH (ref 39–117)
BUN / CREAT RATIO: 12 (ref 12–28)
BUN: 8 mg/dL (ref 8–27)
Bilirubin Total: 1.1 mg/dL (ref 0.0–1.2)
CALCIUM: 8.5 mg/dL — AB (ref 8.7–10.3)
CO2: 26 mmol/L (ref 20–29)
Chloride: 106 mmol/L (ref 96–106)
Creatinine, Ser: 0.69 mg/dL (ref 0.57–1.00)
GFR, EST AFRICAN AMERICAN: 100 mL/min/{1.73_m2} (ref >59)
GFR, EST NON AFRICAN AMERICAN: 87 mL/min/{1.73_m2} (ref >59)
GLOBULIN, TOTAL: 2.3 g/dL (ref 1.5–4.5)
GLUCOSE: 76 mg/dL (ref 65–99)
POTASSIUM: 4 mmol/L (ref 3.5–5.2)
SODIUM: 144 mmol/L (ref 134–144)
Total Protein: 6.1 g/dL (ref 6.0–8.5)

## 2017-06-26 LAB — LIPID PANEL
Chol/HDL Ratio: 2.3 ratio (ref 0.0–4.4)
Cholesterol, Total: 115 mg/dL (ref 100–199)
HDL: 50 mg/dL (ref >39)
LDL Calculated: 43 mg/dL (ref 0–99)
Triglycerides: 109 mg/dL (ref 0–149)
VLDL CHOLESTEROL CAL: 22 mg/dL (ref 5–40)

## 2017-06-26 LAB — CBC
Hematocrit: 30.5 % — ABNORMAL LOW (ref 34.0–46.6)
Hemoglobin: 9.8 g/dL — ABNORMAL LOW (ref 11.1–15.9)
MCH: 29.8 pg (ref 26.6–33.0)
MCHC: 32.1 g/dL (ref 31.5–35.7)
MCV: 93 fL (ref 79–97)
PLATELETS: 81 10*3/uL — AB (ref 150–379)
RBC: 3.29 x10E6/uL — ABNORMAL LOW (ref 3.77–5.28)
RDW: 17.2 % — AB (ref 12.3–15.4)
WBC: 3.4 10*3/uL (ref 3.4–10.8)

## 2017-06-26 LAB — TSH: TSH: 6.37 u[IU]/mL — ABNORMAL HIGH (ref 0.450–4.500)

## 2017-07-01 ENCOUNTER — Ambulatory Visit
Admission: RE | Admit: 2017-07-01 | Discharge: 2017-07-01 | Disposition: A | Discharge status: Home / Self Care | Payer: Medicare Other | Source: Ambulatory Visit | Attending: Family Medicine | Admitting: Family Medicine

## 2017-07-01 ENCOUNTER — Telehealth: Payer: Self-pay | Admitting: *Deleted

## 2017-07-01 DIAGNOSIS — R1909 Other intra-abdominal and pelvic swelling, mass and lump: Secondary | ICD-10-CM

## 2017-07-01 NOTE — Telephone Encounter (Signed)
-----   Message from Birdie Sons, MD sent at 07/01/2017  4:11 PM EDT ----- Appears to be some type of cystic mass in groin which is reassuring. May be a hernia, but needs inguinal CT for further evaluation.

## 2017-07-02 NOTE — Telephone Encounter (Signed)
Please schedule CT scan. Also notify patient that scan will be done at [facility] location. Thanks!

## 2017-07-07 ENCOUNTER — Other Ambulatory Visit: Payer: Self-pay | Admitting: General Surgery

## 2017-07-09 ENCOUNTER — Ambulatory Visit
Admission: RE | Admit: 2017-07-09 | Discharge: 2017-07-09 | Disposition: A | Discharge status: Home / Self Care | Payer: Medicare Other | Source: Ambulatory Visit | Attending: Family Medicine | Admitting: Family Medicine

## 2017-07-09 DIAGNOSIS — R188 Other ascites: Secondary | ICD-10-CM | POA: Insufficient documentation

## 2017-07-09 DIAGNOSIS — K766 Portal hypertension: Secondary | ICD-10-CM | POA: Insufficient documentation

## 2017-07-09 DIAGNOSIS — K7460 Unspecified cirrhosis of liver: Secondary | ICD-10-CM | POA: Diagnosis not present

## 2017-07-09 DIAGNOSIS — R1909 Other intra-abdominal and pelvic swelling, mass and lump: Secondary | ICD-10-CM

## 2017-07-09 DIAGNOSIS — R161 Splenomegaly, not elsewhere classified: Secondary | ICD-10-CM | POA: Insufficient documentation

## 2017-07-09 DIAGNOSIS — R911 Solitary pulmonary nodule: Secondary | ICD-10-CM | POA: Insufficient documentation

## 2017-07-09 DIAGNOSIS — I700 Atherosclerosis of aorta: Secondary | ICD-10-CM | POA: Diagnosis not present

## 2017-07-09 MED ORDER — IOPAMIDOL (ISOVUE-300) INJECTION 61%
100.0000 mL | Freq: Once | INTRAVENOUS | Status: AC | PRN
  Administered 2017-07-09: 100 mL via INTRAVENOUS

## 2017-07-14 ENCOUNTER — Telehealth: Payer: Self-pay

## 2017-07-14 DIAGNOSIS — R911 Solitary pulmonary nodule: Secondary | ICD-10-CM

## 2017-07-14 DIAGNOSIS — R918 Other nonspecific abnormal finding of lung field: Secondary | ICD-10-CM | POA: Insufficient documentation

## 2017-07-14 NOTE — Telephone Encounter (Signed)
Have sent order to sarah. Can have this done withOUT contrast.

## 2017-07-14 NOTE — Addendum Note (Signed)
Addended by: Birdie Sons on: 07/14/2017 04:56 PM   Modules accepted: Orders

## 2017-07-14 NOTE — Telephone Encounter (Signed)
Advised daughter and POA of CT results. Will follow up with Dr. Bary Castilla. Advised on nodule and daughter said that patient soiled clothing and had several loose stools while getting last CT from the drink she had to drink. She had to use clothing from radiology to get home. Asking if we need to order this with dye and afraid this will happen again. Advised that we will call her back regarding who is ordering this Chest CT and when and where it will be since I see no order. She will expect a call back in day or two from [facility] staff member regarding this.

## 2017-07-15 ENCOUNTER — Telehealth: Payer: Self-pay

## 2017-07-15 NOTE — Telephone Encounter (Signed)
Caregiver wanted to know if they could do the CT scan after 8/23 when they see Dr. Tollie Pizza? She reports that the patient has bipolar, and it would be easier to deal with one issue at a time. She would like to handle the cyst in the groin area first, then handle the lung nodule. She is wanting to know if that was ok with you? Please advise. Thanks!

## 2017-07-15 NOTE — Telephone Encounter (Signed)
That's fine. There is no rush, but is needs to be done.

## 2017-07-15 NOTE — Telephone Encounter (Signed)
Patient's friend Sanjuana Mae was notified.

## 2017-07-15 NOTE — Telephone Encounter (Signed)
L/M stating below.

## 2017-07-24 ENCOUNTER — Encounter: Payer: Self-pay | Admitting: General Surgery

## 2017-07-24 ENCOUNTER — Ambulatory Visit (INDEPENDENT_AMBULATORY_CARE_PROVIDER_SITE_OTHER): Payer: Medicare Other | Admitting: General Surgery

## 2017-07-24 VITALS — BP 120/80 | HR 70 | Resp 12 | Ht 64.0 in | Wt 134.0 lb

## 2017-07-24 DIAGNOSIS — K4090 Unilateral inguinal hernia, without obstruction or gangrene, not specified as recurrent: Secondary | ICD-10-CM

## 2017-07-24 NOTE — Patient Instructions (Addendum)
CT chest is scheduled for 08-06-17.  May apply Gold bond powder to irritated areas   Inguinal Hernia, Adult An inguinal hernia is when fat or the intestines push through the area where the leg meets the lower belly (groin) and make a rounded lump (bulge). This condition happens over time. There are three types of inguinal hernias. These types include:  Hernias that can be pushed back into the belly (are reducible).  Hernias that cannot be pushed back into the belly (are incarcerated).  Hernias that cannot be pushed back into the belly and lose their blood supply (get strangulated). This type needs emergency surgery.  Follow these instructions at home: Lifestyle  Drink enough fluid to keep your urine (pee) clear or pale yellow.  Eat plenty of fruits, vegetables, and whole grains. These have a lot of fiber. Talk with your doctor if you have questions.  Avoid lifting heavy objects.  Avoid standing for long periods of time.  Do not use tobacco products. These include cigarettes, chewing tobacco, or e-cigarettes. If you need help quitting, ask your doctor.  Try to stay at a healthy weight. General instructions  Do not try to force the hernia back in.  Watch your hernia for any changes in color or size. Let your doctor know if there are any changes.  Take over-the-counter and prescription medicines only as told by your doctor.  Keep all follow-up visits as told by your doctor. This is important. Contact a doctor if:  You have a fever.  You have new symptoms.  Your symptoms get worse. Get help right away if:  The area where the legs meets the lower belly has: ? Pain that gets worse suddenly. ? A bulge that gets bigger suddenly and does not go down. ? A bulge that turns red or purple. ? A bulge that is painful to the touch.  You are a man and your scrotum: ? Suddenly feels painful. ? Suddenly changes in size.  You feel sick to your stomach (nauseous) and this feeling does  not go away.  You throw up (vomit) and this keeps happening.  You feel your heart beating a lot more quickly than normal.  You cannot poop (have a bowel movement) or pass gas. This information is not intended to replace advice given to you by your health care provider. Make sure you discuss any questions you have with your health care provider. Document Released: 12/19/2006 Document Revised: 04/25/2016 Document Reviewed: 09/28/2014 Elsevier Interactive Patient Education  2018 [employer].

## 2017-07-24 NOTE — Progress Notes (Signed)
Patient ID: Margaret Romero, female   DOB: 1946/11/06, 71 y.o.   MRN: [id number]  Chief Complaint  Patient presents with  . Other    HPI Margaret Romero is a 71 y.o. female here today for a evaluation of a right groin mass. She noticed it about six weeks ago, no chance in size. Ct scan was done on 07/09/2017. Her friend [name] is present with her. CT chest is scheduled for 08-06-17.   HPI  Past Medical History:  Diagnosis Date  . Bipolar affective ([place])   . Closed fracture carpal bone 11/16/2015  . Malignant neoplasm of thyroid gland [facility]) January 23, 2011   medullary carcinoma thyroid T2, Nx.  . Memory changes     Past Surgical History:  Procedure Laterality Date  . CAROTID ENDARTERECTOMY Right November 2015   [name], MD  . [name]  2006  . COLONOSCOPY  2008  . CORONARY STENT PLACEMENT  2007  . HERNIA REPAIR  2007  . NASAL SINUS SURGERY  2008  . SKIN CANCER EXCISION    . THYROIDECTOMY  2012    Family History  Problem Relation Age of Onset  . Cancer Sister        Bone  . Cancer Brother        Bone  . Cancer Sister        Breast  . Cancer Sister        Colon   . Heart attack Daughter     Social History Social History  Substance Use Topics  . Smoking status: Former Smoker    Packs/day: 1.00    Years: 10.00    Quit date: 12/01/1974  . Smokeless tobacco: Never Used  . Alcohol use No    No Known Allergies  Current Outpatient Prescriptions  Medication Sig Dispense Refill  . aspirin 81 MG tablet Take 81 mg by mouth daily.    Marland Kitchen donepezil (ARICEPT) 10 MG tablet Take 1 tablet (10 mg total) by mouth at bedtime. 90 tablet 2  . escitalopram (LEXAPRO) 10 MG tablet Take 1 tablet (10 mg total) by mouth daily. 90 tablet 4  . isosorbide mononitrate (IMDUR) 30 MG 24 hr tablet Take 1 tablet (30 mg total) by mouth daily. 90 tablet 1  . lamoTRIgine (LAMICTAL) 25 MG tablet Take 3 tablets (75 mg total) by mouth daily. 270 tablet 2  . levothyroxine  (SYNTHROID, LEVOTHROID) 75 MCG tablet TAKE 1 TABLET BY MOUTH ONCE A DAY 90 tablet 3  . losartan (COZAAR) 50 MG tablet Take 1 tablet by mouth daily.    . mesalamine (LIALDA) 1.2 g EC tablet TAKE 2 TABLETS BY MOUTH ONCE A DAY WITH BREAKFAST 60 tablet 5  . Multiple Vitamins-Minerals (MULTIVITAMIN WITH MINERALS) tablet Take 1 tablet by mouth daily.    . nitroGLYCERIN (NITROSTAT) 0.4 MG SL tablet Place under the tongue.    . pantoprazole (PROTONIX) 40 MG tablet TAKE 1 TABLET BY MOUTH ONCE A DAY 90 tablet 3  . simvastatin (ZOCOR) 40 MG tablet Take 1 tablet by mouth daily.    . Albuterol Sulfate (VENTOLIN HFA IN) Inhale into the lungs as needed.     No current facility-administered medications for this visit.     Review of Systems Review of Systems  Constitutional: Negative.   Respiratory: Negative.   Cardiovascular: Negative.     Blood pressure 120/80, pulse 70, resp. rate 12, height 5' 4"  (1.626 m), weight 134 lb (60.8 kg).  Physical Exam Physical Exam  Constitutional: She is oriented to person, place, and time. She appears well-developed and well-nourished.  HENT:  Mouth/Throat: Oropharynx is clear and moist.  Eyes: Conjunctivae are normal. No scleral icterus.  Neck: Neck supple.    Cardiovascular: Normal rate, regular rhythm and normal heart sounds.   Pulmonary/Chest: Effort normal and breath sounds normal.  Abdominal: Soft. Normal appearance and bowel sounds are normal. She exhibits no fluid wave. There is no hepatosplenomegaly. There is no tenderness. A hernia is present. Hernia confirmed positive in the right inguinal area.    Lymphadenopathy:    She has no cervical adenopathy.  Neurological: She is alert and oriented to person, place, and time.  Skin: Skin is warm and dry.  Psychiatric: Her behavior is normal.    Data Reviewed 07/09/2017 CT of the abdomen and pelvis: IMPRESSION: 1. The patient's right groin pain and swelling appears to be from a small cystic appearing  mass or collection along the right inguinal canal measuring 14 x 12 x 14 mm. This could be further assessed, if desired clinically, with ultrasound. 2. 6 mm nodule in the right middle lobe new from the prior CT. This could reflect a new benign nodule or metastatic disease. Recommend follow-up chest CT to assess for additional nodules. 3. There is advanced cirrhosis with portal venous hypertension reflected by splenomegaly, some vascular collaterals and a small amount of ascites. Ascites is new since the prior CT. 4. No acute abnormalities within the abdomen pelvis. 5. Aortic atherosclerosis.  July 01, 2017 ultrasound report from this area: IMPRESSION: 5.7 x 1.1 x 3.2 cm hypoechoic fluid appearing structure right inguinal region. It is possible this represents a fluid containing inguinal hernia. CT may be considered for further delineation. No inguinal hernia was seen on the prior CT.IMPRESSION:   Assessment    Right inguinal hernia  Progressive cirrhosis with new identification of ascites.     Plan    Indication for elective repair reviewed.    CT chest is scheduled for 08-06-17.  May apply Gold bond powder to irritated areas.  Hernia precautions and incarceration were discussed with the patient. If they develop symptoms of an incarcerated hernia, they were encouraged to seek prompt medical attention.  I have recommended repair of the hernia using mesh on an outpatient basis at her convenience. The risk of infection was reviewed. The role of prosthetic mesh to minimize the risk of recurrence was reviewed.  At this time, the patient has been to defer decision regarding hernia repair until the lung lesion workup is complete.   HPI, Physical Exam, Assessment and Plan have been scribed under the direction and in the presence of [name], MD. Karie Fetch, RN  I have completed the exam and reviewed the above documentation for accuracy and completeness.  I agree with  the above.  Haematologist has been used and any errors in dictation or transcription are unintentional.  [name], M.D., F.A.C.S.    [name] 07/25/2017, 9:45 AM

## 2017-07-25 DIAGNOSIS — K4090 Unilateral inguinal hernia, without obstruction or gangrene, not specified as recurrent: Secondary | ICD-10-CM | POA: Insufficient documentation

## 2017-07-28 ENCOUNTER — Ambulatory Visit: Payer: Medicare Other

## 2017-07-30 DIAGNOSIS — I10 Essential (primary) hypertension: Secondary | ICD-10-CM | POA: Diagnosis not present

## 2017-07-30 DIAGNOSIS — I6523 Occlusion and stenosis of bilateral carotid arteries: Secondary | ICD-10-CM | POA: Diagnosis not present

## 2017-07-30 DIAGNOSIS — I2510 Atherosclerotic heart disease of native coronary artery without angina pectoris: Secondary | ICD-10-CM | POA: Diagnosis not present

## 2017-07-30 DIAGNOSIS — Z955 Presence of coronary angioplasty implant and graft: Secondary | ICD-10-CM | POA: Diagnosis not present

## 2017-07-30 DIAGNOSIS — E785 Hyperlipidemia, unspecified: Secondary | ICD-10-CM | POA: Diagnosis not present

## 2017-08-06 ENCOUNTER — Encounter: Payer: Self-pay | Admitting: Family Medicine

## 2017-08-06 ENCOUNTER — Ambulatory Visit
Admission: RE | Admit: 2017-08-06 | Discharge: 2017-08-06 | Disposition: A | Discharge status: Home / Self Care | Payer: Medicare Other | Source: Ambulatory Visit | Attending: Family Medicine | Admitting: Family Medicine

## 2017-08-06 DIAGNOSIS — J439 Emphysema, unspecified: Secondary | ICD-10-CM | POA: Diagnosis not present

## 2017-08-06 DIAGNOSIS — I2510 Atherosclerotic heart disease of native coronary artery without angina pectoris: Secondary | ICD-10-CM | POA: Insufficient documentation

## 2017-08-06 DIAGNOSIS — K7460 Unspecified cirrhosis of liver: Secondary | ICD-10-CM | POA: Insufficient documentation

## 2017-08-06 DIAGNOSIS — R911 Solitary pulmonary nodule: Secondary | ICD-10-CM | POA: Diagnosis not present

## 2017-08-06 DIAGNOSIS — I700 Atherosclerosis of aorta: Secondary | ICD-10-CM | POA: Diagnosis not present

## 2017-08-06 DIAGNOSIS — R161 Splenomegaly, not elsewhere classified: Secondary | ICD-10-CM | POA: Insufficient documentation

## 2017-08-06 DIAGNOSIS — I313 Pericardial effusion (noninflammatory): Secondary | ICD-10-CM | POA: Insufficient documentation

## 2017-08-06 DIAGNOSIS — Z85850 Personal history of malignant neoplasm of thyroid: Secondary | ICD-10-CM | POA: Insufficient documentation

## 2017-08-07 ENCOUNTER — Other Ambulatory Visit: Payer: Self-pay | Admitting: Family Medicine

## 2017-11-03 ENCOUNTER — Telehealth: Payer: Self-pay | Admitting: Family Medicine

## 2017-11-03 DIAGNOSIS — R911 Solitary pulmonary nodule: Secondary | ICD-10-CM

## 2017-11-03 NOTE — Telephone Encounter (Signed)
LMOVM for pt to return call 

## 2017-11-03 NOTE — Telephone Encounter (Signed)
Time for follow up CT of lung nodules from September. Please advise patient and order CT chest without contrast for sarah to schedule

## 2017-11-06 NOTE — Telephone Encounter (Signed)
Please schedule CT scan? Patient does not want the appt before Christmas. Thanks!

## 2017-11-06 NOTE — Telephone Encounter (Signed)
LMOVM for pt to return call 

## 2017-11-06 NOTE — Telephone Encounter (Signed)
Patient's friend Margaret Romero was advised. CT scan ordered.

## 2017-11-07 NOTE — Telephone Encounter (Signed)
Order has been placed apparently she would like it done after christmas. Thanks!

## 2017-11-17 ENCOUNTER — Other Ambulatory Visit: Payer: Self-pay

## 2017-11-17 ENCOUNTER — Encounter: Payer: Self-pay | Admitting: Psychiatry

## 2017-11-17 ENCOUNTER — Ambulatory Visit: Payer: Medicare Other | Admitting: Psychiatry

## 2017-11-17 VITALS — BP 159/57 | HR 73 | Temp 97.9°F | Wt 139.4 lb

## 2017-11-17 DIAGNOSIS — R4689 Other symptoms and signs involving appearance and behavior: Secondary | ICD-10-CM | POA: Diagnosis not present

## 2017-11-17 DIAGNOSIS — F3181 Bipolar II disorder: Secondary | ICD-10-CM

## 2017-11-17 DIAGNOSIS — R4189 Other symptoms and signs involving cognitive functions and awareness: Secondary | ICD-10-CM

## 2017-11-17 MED ORDER — ESCITALOPRAM OXALATE 10 MG PO TABS: 10.0000 mg | ORAL_TABLET | Freq: Every day | ORAL | 4 refills | Status: AC

## 2017-11-17 MED ORDER — LAMOTRIGINE 25 MG PO TABS: 75.0000 mg | ORAL_TABLET | Freq: Every day | ORAL | 2 refills | Status: DC

## 2017-11-17 MED ORDER — DONEPEZIL HCL 10 MG PO TABS: 10.0000 mg | ORAL_TABLET | Freq: Every day | ORAL | 2 refills | Status: DC

## 2017-11-17 NOTE — Progress Notes (Signed)
History of Present Illness:  Patient is a 71 year old female who presented for follow-up accompanied by her friend.   patient reported that she is currently doing well. 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 She also has a lung nodule and is going to have CT scan done after the holidays. Her friend reported that patient has been compliant with her medications. Patient continues to work as usual. She was pleasant and cooperative. No acute issues noted at this time. Her friend reported that she has been taking her medications on her own. Patient reported that she enjoys working outside in when the weather is good. She remains pleasant. No acute symptoms noted at this time. Her friend is supportive and has been making sure that she continues to take her medication and also going for her appointments on a regular basis.   Her friend Flonnie, always accompanies for her appointments   . She denied having any suicidal homicidal ideations or plans.   Elements:  Severity:  mild. Associated Signs/Symptoms: Depression Symptoms:  fatigue, (Hypo) Manic Symptoms:  Labiality of Mood, Anxiety Symptoms:  none Psychotic Symptoms:  none PTSD Symptoms: Negative NA Past Medical History:      Past Medical History:  Diagnosis Date  . Bipolar disorder ([place])   . GERD (gastroesophageal reflux disease)   . Heart disease   . Hypertension   . IBS (irritable bowel syndrome) 2000  . Malignant neoplasm of thyroid gland [facility]) January 23, 2011   medullary carcinoma thyroid T2, Nx.  Marland Kitchen MCI (mild cognitive impairment) 2017  . Occlusion and stenosis of carotid artery without mention of cerebral infarction          Past Surgical History:  Procedure Laterality Date  . CAROTID ENDARTERECTOMY Right November 2015   [name], MD  . [name]  2006  . COLONOSCOPY  2008  . CORONARY STENT PLACEMENT  2007  .  HERNIA REPAIR  2007  . NASAL SINUS SURGERY  2008  . SKIN CANCER EXCISION    . THYROIDECTOMY  2012   Family History:  Problem Relation Age of Onset  . Cancer Sister        Bone  . Cancer Brother        Bone  . Cancer Sister        Breast  . Cancer Sister        Colon    Additional Social History:  Patient is currently divorced. She has a daughter and 5 grandchildren. [name]. Psychiatric Specialty Exam: Medication Refill     ROS  Blood pressure (!) 164/62, pulse 65, temperature 97.9 F (36.6 C), temperature source Oral, weight 137 lb 6.4 oz (62.3 kg).Body mass index is 24.34 kg/m.  General Appearance: Casual  Eye Contact:  Fair  Speech:  Clear and Coherent  Volume:  Normal  Mood:  happy  Affect:  Congruent  Thought Process:  Goal Directed and Logical  Orientation:  Full (Time, Place, and Person)  Thought Content:  WDL  Suicidal Thoughts:  No  Homicidal Thoughts:  No  Memory:  impaired  Judgement:  Poor  Insight:  Lacking  Psychomotor Activity:  Normal  Concentration:  Poor  Recall:  Poor  Fund of Knowledge:Fair  Language: Fair  Akathisia:  No  Handed:  Right  AIMS (if indicated):    Assets:  Communication Skills Housing Social Support  ADL's:  Intact  Cognition: WNL  Sleep:      Allergies:  No Known Allergies Current Medications:       Current Outpatient Prescriptions  Medication Sig Dispense Refill  . Albuterol Sulfate (VENTOLIN HFA IN) Inhale into the lungs as needed.    Marland Kitchen aspirin 81 MG tablet Take 81 mg by mouth daily.    Marland Kitchen escitalopram (LEXAPRO) 10 MG tablet Take 1 tablet (10 mg total) by mouth daily. 90 tablet 4  . isosorbide mononitrate (IMDUR) 30 MG 24 hr tablet TAKE 1 TABLET BY MOUTH ONCE A DAY 90 tablet 3  . lamoTRIgine (LAMICTAL) 25 MG tablet Take 3 tablets (75 mg total) by mouth daily. 270 tablet 2  . levothyroxine (SYNTHROID, LEVOTHROID) 75 MCG tablet TAKE ONE TABLET BY MOUTH  EVERY DAY 30 tablet 10  . losartan (COZAAR) 50 MG tablet Take 1 tablet by mouth daily.    . mesalamine (LIALDA) 1.2 g EC tablet TAKE 2 TABLETS BY MOUTH ONCE A DAY WITH BREAKFAST 60 tablet 5  . Multiple Vitamins-Minerals (MULTIVITAMIN WITH MINERALS) tablet Take 1 tablet by mouth daily.    . nitroGLYCERIN (NITROSTAT) 0.4 MG SL tablet Place under the tongue.    . pantoprazole (PROTONIX) 40 MG tablet TAKE 1 TABLET BY MOUTH ONCE A DAY 90 tablet 3  . simvastatin (ZOCOR) 40 MG tablet Take 1 tablet by mouth daily.    Marland Kitchen donepezil (ARICEPT) 10 MG tablet Take 1 tablet (10 mg total) by mouth at bedtime. 90 tablet 2   No current facility-administered medications for this visit.     Previous Psychotropic Medications: Patient is currently taking Lexapro 10 mg daily. She has never tried a mood stabilizer in the past. She was recently started on Aricept by Dr. Manuella Ghazi neurologist. She is reporting some diarrhea as the adverse effects. She does not have any previous history of psychiatric hospitalization.  Substance Abuse History in the last 12 months:  No.  Treatment Plan Summary: Medication management   Discussed with patient and her friend about the medications treatment risk benefits and alternatives Continue  lamotrigine 75 mg by mouth daily. Advised them  about the side effects including risk of rash and Kathreen Cosier syndrome and she demonstrated understanding Continue  Lexapro 10  mg daily.  She will also continue on Aricept 10 mg daily.  Medications refilled for the 90 day supply with refills  Follow-up in 6  month   More than 50% of the time spent in psychoeducation, counseling and coordination of care.    This note was generated in part or whole with voice recognition software. Voice regonition is usually quite accurate but there are transcription errors that can and very often do occur. I apologize for any typographical errors that were not detected and corrected.     Rainey Pines, M.D

## 2017-11-28 ENCOUNTER — Ambulatory Visit
Admission: RE | Admit: 2017-11-28 | Discharge: 2017-11-28 | Disposition: A | Discharge status: Home / Self Care | Payer: Medicare Other | Source: Ambulatory Visit | Attending: Family Medicine | Admitting: Family Medicine

## 2017-11-28 DIAGNOSIS — Z85850 Personal history of malignant neoplasm of thyroid: Secondary | ICD-10-CM | POA: Insufficient documentation

## 2017-11-28 DIAGNOSIS — R918 Other nonspecific abnormal finding of lung field: Secondary | ICD-10-CM | POA: Diagnosis not present

## 2017-11-28 DIAGNOSIS — I313 Pericardial effusion (noninflammatory): Secondary | ICD-10-CM | POA: Insufficient documentation

## 2017-11-28 DIAGNOSIS — I700 Atherosclerosis of aorta: Secondary | ICD-10-CM | POA: Diagnosis not present

## 2017-11-28 DIAGNOSIS — R161 Splenomegaly, not elsewhere classified: Secondary | ICD-10-CM | POA: Insufficient documentation

## 2017-11-28 DIAGNOSIS — R911 Solitary pulmonary nodule: Secondary | ICD-10-CM | POA: Diagnosis not present

## 2017-11-28 DIAGNOSIS — K7460 Unspecified cirrhosis of liver: Secondary | ICD-10-CM | POA: Insufficient documentation

## 2017-11-28 DIAGNOSIS — J432 Centrilobular emphysema: Secondary | ICD-10-CM | POA: Diagnosis not present

## 2017-11-28 DIAGNOSIS — I2510 Atherosclerotic heart disease of native coronary artery without angina pectoris: Secondary | ICD-10-CM | POA: Diagnosis not present

## 2017-12-01 ENCOUNTER — Encounter: Payer: Self-pay | Admitting: Family Medicine

## 2017-12-01 ENCOUNTER — Other Ambulatory Visit: Payer: Self-pay | Admitting: Family Medicine

## 2017-12-01 ENCOUNTER — Telehealth: Payer: Self-pay | Admitting: *Deleted

## 2017-12-01 DIAGNOSIS — K7689 Other specified diseases of liver: Secondary | ICD-10-CM | POA: Insufficient documentation

## 2017-12-01 DIAGNOSIS — K219 Gastro-esophageal reflux disease without esophagitis: Secondary | ICD-10-CM

## 2017-12-01 DIAGNOSIS — R918 Other nonspecific abnormal finding of lung field: Secondary | ICD-10-CM

## 2017-12-01 NOTE — Telephone Encounter (Signed)
Patient's friend Sanjuana Mae was notified of results. Expressed understanding. Referral ordered.

## 2017-12-01 NOTE — Telephone Encounter (Signed)
Please schedule referral to oncology. Thanks!

## 2017-12-01 NOTE — Telephone Encounter (Signed)
-----   Message from Birdie Sons, MD sent at 12/01/2017  8:04 AM EST ----- CT shows several small but enlarging lung nodules and a small liver nodule. Needs referral to oncology for further evaluation due to history of thyroid cancer.

## 2017-12-05 ENCOUNTER — Telehealth: Payer: Self-pay | Admitting: Oncology

## 2017-12-05 ENCOUNTER — Ambulatory Visit: Payer: Medicare Other | Admitting: Oncology

## 2017-12-05 ENCOUNTER — Other Ambulatory Visit: Payer: Self-pay | Admitting: *Deleted

## 2017-12-05 DIAGNOSIS — Z85850 Personal history of malignant neoplasm of thyroid: Secondary | ICD-10-CM

## 2017-12-05 DIAGNOSIS — R918 Other nonspecific abnormal finding of lung field: Secondary | ICD-10-CM

## 2017-12-05 DIAGNOSIS — K769 Liver disease, unspecified: Secondary | ICD-10-CM

## 2017-12-05 NOTE — Telephone Encounter (Signed)
Patient is a 72 year old female with a past medical history significant for thyroid cancer. She had a total thyroidectomy in 2012.  More recently in August 2018, patient had a right inguinal mass that was evaluated by CT scan which also noted an incidental pulmonary nodule.  A dedicated chest CT was subsequently performed on August 06, 2017 which confirmed a 6 mm right middle lobe pulmonary nodule that was new from previous imaging in 2016.  Follow-up CT scan completed on November 28, 2017 revealed multiple pulmonary nodules scattered throughout both lungs all new since 2016 and all slightly increased in size from her most recent CT scan in September 2018.  Patient also was noted to have a new indeterminate left liver lobe lesion concerning for metastatic disease.  Patient has been referred to the cancer center for further evaluation and will require a PET scan for further evaluation and to optimize management.  PET scan is also needed to assist in further diagnostic studies including possible biopsy.

## 2017-12-08 ENCOUNTER — Other Ambulatory Visit: Payer: Self-pay | Admitting: Family Medicine

## 2017-12-08 DIAGNOSIS — I10 Essential (primary) hypertension: Secondary | ICD-10-CM

## 2017-12-08 NOTE — Telephone Encounter (Signed)
Pharmacy requesting refills. Thanks!  

## 2017-12-12 ENCOUNTER — Ambulatory Visit: Payer: Medicare Other | Admitting: Hematology and Oncology

## 2017-12-12 ENCOUNTER — Ambulatory Visit
Admission: RE | Admit: 2017-12-12 | Discharge: 2017-12-12 | Disposition: A | Discharge status: Home / Self Care | Payer: Medicare Other | Source: Ambulatory Visit | Attending: Oncology | Admitting: Oncology

## 2017-12-12 DIAGNOSIS — C73 Malignant neoplasm of thyroid gland: Secondary | ICD-10-CM | POA: Diagnosis not present

## 2017-12-12 DIAGNOSIS — K769 Liver disease, unspecified: Secondary | ICD-10-CM

## 2017-12-12 DIAGNOSIS — I2510 Atherosclerotic heart disease of native coronary artery without angina pectoris: Secondary | ICD-10-CM | POA: Insufficient documentation

## 2017-12-12 DIAGNOSIS — K7460 Unspecified cirrhosis of liver: Secondary | ICD-10-CM | POA: Diagnosis not present

## 2017-12-12 DIAGNOSIS — Z85850 Personal history of malignant neoplasm of thyroid: Secondary | ICD-10-CM | POA: Insufficient documentation

## 2017-12-12 DIAGNOSIS — R918 Other nonspecific abnormal finding of lung field: Secondary | ICD-10-CM

## 2017-12-12 DIAGNOSIS — R188 Other ascites: Secondary | ICD-10-CM | POA: Diagnosis not present

## 2017-12-12 DIAGNOSIS — I700 Atherosclerosis of aorta: Secondary | ICD-10-CM | POA: Insufficient documentation

## 2017-12-12 DIAGNOSIS — R162 Hepatomegaly with splenomegaly, not elsewhere classified: Secondary | ICD-10-CM | POA: Insufficient documentation

## 2017-12-12 DIAGNOSIS — I313 Pericardial effusion (noninflammatory): Secondary | ICD-10-CM | POA: Insufficient documentation

## 2017-12-12 DIAGNOSIS — K766 Portal hypertension: Secondary | ICD-10-CM | POA: Diagnosis not present

## 2017-12-12 LAB — GLUCOSE, CAPILLARY: Glucose-Capillary: 88 mg/dL (ref 65–99)

## 2017-12-12 MED ORDER — FLUDEOXYGLUCOSE F - 18 (FDG) INJECTION
12.2700 | Freq: Once | INTRAVENOUS | Status: AC | PRN
  Administered 2017-12-12: 12.27 via INTRAVENOUS

## 2017-12-15 ENCOUNTER — Encounter: Payer: Self-pay | Admitting: Oncology

## 2017-12-15 ENCOUNTER — Other Ambulatory Visit: Payer: Self-pay

## 2017-12-15 ENCOUNTER — Inpatient Hospital Stay: Payer: Medicare Other | Attending: Oncology | Admitting: Oncology

## 2017-12-15 ENCOUNTER — Encounter: Payer: Self-pay | Admitting: *Deleted

## 2017-12-15 ENCOUNTER — Inpatient Hospital Stay: Payer: Medicare Other

## 2017-12-15 VITALS — BP 170/54 | HR 93 | Temp 97.2°F | Ht 62.5 in | Wt 135.0 lb

## 2017-12-15 DIAGNOSIS — K769 Liver disease, unspecified: Secondary | ICD-10-CM | POA: Insufficient documentation

## 2017-12-15 DIAGNOSIS — R918 Other nonspecific abnormal finding of lung field: Secondary | ICD-10-CM | POA: Diagnosis not present

## 2017-12-15 DIAGNOSIS — K7460 Unspecified cirrhosis of liver: Secondary | ICD-10-CM | POA: Diagnosis not present

## 2017-12-15 DIAGNOSIS — Z85850 Personal history of malignant neoplasm of thyroid: Secondary | ICD-10-CM

## 2017-12-15 DIAGNOSIS — D649 Anemia, unspecified: Secondary | ICD-10-CM | POA: Diagnosis not present

## 2017-12-15 DIAGNOSIS — D509 Iron deficiency anemia, unspecified: Secondary | ICD-10-CM | POA: Diagnosis not present

## 2017-12-15 DIAGNOSIS — D696 Thrombocytopenia, unspecified: Secondary | ICD-10-CM | POA: Insufficient documentation

## 2017-12-15 LAB — CBC WITH DIFFERENTIAL/PLATELET
BASOS PCT: 0 %
Basophils Absolute: 0 10*3/uL (ref 0–0.1)
EOS ABS: 0 10*3/uL (ref 0–0.7)
EOS PCT: 1 %
HCT: 29.2 % — ABNORMAL LOW (ref 35.0–47.0)
Hemoglobin: 9.2 g/dL — ABNORMAL LOW (ref 12.0–16.0)
Lymphocytes Relative: 16 %
Lymphs Abs: 0.5 10*3/uL — ABNORMAL LOW (ref 1.0–3.6)
MCH: 28.1 pg (ref 26.0–34.0)
MCHC: 31.4 g/dL — AB (ref 32.0–36.0)
MCV: 89.5 fL (ref 80.0–100.0)
Monocytes Absolute: 0.2 10*3/uL (ref 0.2–0.9)
Monocytes Relative: 7 %
Neutro Abs: 2.2 10*3/uL (ref 1.4–6.5)
Neutrophils Relative %: 76 %
PLATELETS: 78 10*3/uL — AB (ref 150–440)
RBC: 3.26 MIL/uL — AB (ref 3.80–5.20)
RDW: 17.2 % — ABNORMAL HIGH (ref 11.5–14.5)
WBC: 2.9 10*3/uL — AB (ref 3.6–11.0)

## 2017-12-15 LAB — COMPREHENSIVE METABOLIC PANEL
ALK PHOS: 109 U/L (ref 38–126)
ALT: 16 U/L (ref 14–54)
AST: 39 U/L (ref 15–41)
Albumin: 3.4 g/dL — ABNORMAL LOW (ref 3.5–5.0)
Anion gap: 8 (ref 5–15)
BILIRUBIN TOTAL: 1 mg/dL (ref 0.3–1.2)
BUN: 11 mg/dL (ref 6–20)
CALCIUM: 8.4 mg/dL — AB (ref 8.9–10.3)
CO2: 26 mmol/L (ref 22–32)
CREATININE: 0.64 mg/dL (ref 0.44–1.00)
Chloride: 108 mmol/L (ref 101–111)
GFR calc Af Amer: >60 mL/min (ref >60)
Glucose, Bld: 114 mg/dL — ABNORMAL HIGH (ref 65–99)
Potassium: 3.8 mmol/L (ref 3.5–5.1)
Sodium: 142 mmol/L (ref 135–145)
Total Protein: 6.3 g/dL — ABNORMAL LOW (ref 6.5–8.1)

## 2017-12-15 LAB — FERRITIN: FERRITIN: 11 ng/mL (ref 11–307)

## 2017-12-15 LAB — FOLATE: Folate: 34 ng/mL (ref >5.9)

## 2017-12-15 LAB — IRON AND TIBC
IRON: 26 ug/dL — AB (ref 28–170)
SATURATION RATIOS: 9 % — AB (ref 10.4–31.8)
TIBC: 281 ug/dL (ref 250–450)
UIBC: 255 ug/dL

## 2017-12-15 LAB — VITAMIN B12: Vitamin B-12: 692 pg/mL (ref 180–914)

## 2017-12-15 NOTE — Progress Notes (Signed)
Hematology/Oncology Consult note [facility] [phone] [phone]   Patient Care Team: Birdie Sons, MD as PCP - General (Family Medicine) Bary Castilla, Forest Gleason, MD (General Surgery) Schnier, Dolores Lory, MD (Vascular Surgery) Rainey Pines, MD as Referring Physician (Psychiatry) Isaias Cowman, MD as Consulting Physician (Cardiology) Vladimir Crofts, MD as Consulting Physician (Neurology) Jannet Mantis, MD as Consulting Physician (Dermatology) Telford Nab, RN as Registered Nurse  REFERRING PROVIDER:  CHIEF COMPLAINTS/PURPOSE OF CONSULTATION:  Evaluation of lung nodules, liver lesion, and history of Thyroid cancer.   HISTORY OF PRESENTING ILLNESS:  Margaret Romero is a  72 y.o.  female with PMH listed below who was referred to me for evaluation of lung nodule, liver lesion and history of thyroid cancer.  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 Most recently in August 2018 patient had a right inguinal mass that was evaluated by CT scan which also noted and incidentally pulmonary nodule. Dedicated chest CT was subsequently performed on 08/06/2017 which confirmed a 6 mm right middle lobe pulmonary nodule that was new from previous imaging in 2016. Follow-up CT scan completed on 11/28/2017 revealed multiple pulmonary nodules scattered throughout both lungs on you since 2016 and all slightly increasing size from the most recent CT that was done in September 2018. Patient also was noted to have a new intermediate left liver lobe lesion concerning for metastatic disease.  patient had a PET scan done recently prior to coming to see me for additional evaluation. Patient is accompanied by her caregiver and a daughter to the clinic today. Patient has dementia so most history was obtained from caregiver. Patient reports feeling well.  she is a former smoker and a former heavy drinker. Denies any cough, chest pain,  abdominal pain. She is a good appetite. Denies any recent weight loss.    Review of Systems  Constitutional: Negative for chills, fever, malaise/fatigue and weight loss.  HENT: Negative for hearing loss.   Eyes: Negative for blurred vision.  Respiratory: Negative for cough.   Cardiovascular: Negative for chest pain.  Gastrointestinal: Negative for heartburn.  Genitourinary: Negative for dysuria.  Musculoskeletal: Negative for myalgias.  Skin: Negative for rash.  Neurological: Negative for dizziness.  Endo/Heme/Allergies: Does not bruise/bleed easily.  Psychiatric/Behavioral: Negative for depression.    MEDICAL HISTORY:  Past Medical History:  Diagnosis Date  . Bipolar affective ([place])   . Cancer [facility])    thyroid cancer   . Closed fracture carpal bone 11/16/2015  . GERD (gastroesophageal reflux disease)   . Head injury without fracture of skull   . Hypertension   . Malignant neoplasm of thyroid gland [facility]) January 23, 2011   medullary carcinoma thyroid T2, Nx.  . Memory changes   . Thyroid cancer [facility])     SURGICAL HISTORY: Past Surgical History:  Procedure Laterality Date  . CAROTID ENDARTERECTOMY Right November 2015   [name], MD  . [name]  2006  . COLONOSCOPY  2008  . CORONARY STENT PLACEMENT  2007  . HERNIA REPAIR  2007  . NASAL SINUS SURGERY  2008  . SKIN CANCER EXCISION    . THYROIDECTOMY  2012    SOCIAL HISTORY: Social History   Socioeconomic History  . Marital status: Single    Spouse name: Not on file  . Number of children: 1  . Years of education: Not on file  . Highest education level: Not on file  Social Needs  . Financial resource strain: Not  on file  . Food insecurity - worry: Not on file  . Food insecurity - inability: Not on file  . Transportation needs - medical: Not on file  . Transportation needs - non-medical: Not on file  Occupational History  . Occupation: Retired  Tobacco Use  . Smoking status: Former Smoker     Packs/day: 1.00    Years: 10.00    Pack years: 10.00    Last attempt to quit: 12/01/1974    Years since quitting: 43.0  . Smokeless tobacco: Never Used  Substance and Sexual Activity  . Alcohol use: No  . Drug use: No  . Sexual activity: Not Currently  Other Topics Concern  . Not on file  Social History Narrative  . Not on file    FAMILY HISTORY: Family History  Problem Relation Age of Onset  . Bone cancer Sister   . Heart attack Brother   . Breast cancer Sister   . Lung cancer Sister   . Heart attack Daughter     ALLERGIES:  has No Known Allergies.  MEDICATIONS:  Current Outpatient Medications  Medication Sig Dispense Refill  . aspirin 81 MG tablet Take 81 mg by mouth daily.    Marland Kitchen donepezil (ARICEPT) 10 MG tablet Take 1 tablet (10 mg total) by mouth at bedtime. 90 tablet 2  . escitalopram (LEXAPRO) 10 MG tablet Take 1 tablet (10 mg total) by mouth daily. 90 tablet 4  . isosorbide mononitrate (IMDUR) 30 MG 24 hr tablet TAKE 1 TABLET BY MOUTH ONCE DAILY 90 tablet 4  . lamoTRIgine (LAMICTAL) 25 MG tablet Take 3 tablets (75 mg total) by mouth daily. 270 tablet 2  . levothyroxine (SYNTHROID, LEVOTHROID) 75 MCG tablet TAKE 1 TABLET BY MOUTH ONCE A DAY 90 tablet 3  . losartan (COZAAR) 50 MG tablet Take 1 tablet by mouth daily.    . mesalamine (LIALDA) 1.2 g EC tablet TAKE 2 TABLETS BY MOUTH ONCE A DAY WITH BREAKFAST 60 tablet 5  . Multiple Vitamins-Minerals (MULTIVITAMIN WITH MINERALS) tablet Take 1 tablet by mouth daily.    . nitroGLYCERIN (NITROSTAT) 0.4 MG SL tablet Place under the tongue.    . pantoprazole (PROTONIX) 40 MG tablet TAKE 1 TABLET BY MOUTH ONCE A DAY 90 tablet 3  . simvastatin (ZOCOR) 40 MG tablet Take 1 tablet by mouth daily.    . Albuterol Sulfate (VENTOLIN HFA IN) Inhale into the lungs as needed.     No current facility-administered medications for this visit.      PHYSICAL EXAMINATION: ECOG PERFORMANCE STATUS: 1 - Symptomatic but completely  ambulatory Vitals:   12/15/17 1143  BP: (!) 170/54  Pulse: 93  Temp: (!) 97.2 F (36.2 C)   Filed Weights   12/15/17 1143  Weight: 135 lb (61.2 kg)    Physical Exam  Constitutional: She is well-developed, well-nourished, and in no distress. No distress.  HENT:  Head: Normocephalic and atraumatic.  Mouth/Throat: Oropharyngeal exudate present.  Eyes: Conjunctivae are normal. Pupils are equal, round, and reactive to light. No scleral icterus.  Neck: Normal range of motion. Neck supple. No thyromegaly present.  Cardiovascular: Normal rate and regular rhythm.  No murmur heard. Pulmonary/Chest: Effort normal and breath sounds normal. She has no wheezes.  Abdominal: Soft. Bowel sounds are normal. She exhibits no distension.  Musculoskeletal: Normal range of motion. She exhibits no edema.  Neurological: She is alert. No cranial nerve deficit.  Demented  Skin: Skin is warm and dry.  Psychiatric: Affect normal.  LABORATORY DATA:  I have reviewed the data as listed Lab Results  Component Value Date   WBC 2.9 (L) 12/15/2017   HGB 9.2 (L) 12/15/2017   HCT 29.2 (L) 12/15/2017   MCV 89.5 12/15/2017   PLT 78 (L) 12/15/2017   Recent Labs    06/25/17 1503 12/15/17 1127  NA 144 142  K 4.0 3.8  CL 106 108  CO2 26 26  GLUCOSE 76 114*  BUN 8 11  CREATININE 0.69 0.64  CALCIUM 8.5* 8.4*  GFRNONAA 87 >60  GFRAA 100 >60  PROT 6.1 6.3*  ALBUMIN 3.8 3.4*  AST 36 39  ALT 14 16  ALKPHOS 139* 109  BILITOT 1.1 1.0   RADIOGRAPHIC STUDIES: I have personally reviewed the radiological images as listed and agreed with the findings in the report. 12/12/2017 PET scan show no definite hypermetabolic met metastatic disease. Patient has scattered solid nodule in both lungs, largest 7 mm in the right middle lobe, all below pets resolution and not associated with significant metabolic. Slow growing pulmonary metastasis remaining on the differential. Continue CT chest surveillance recommended  in 3 months. Nonspecific mild bilateral hilar hypermetabolic without discrete enlarged hilar nodes on the noncontrast CT images. These findings could be reactive for neoplastic. Follow-up CT in 3 months. Hepatic cirrhosis, small low attention lesion in segment 2 of the left liver lobe is hypermetabolic and appears new since 2016 mri abdomen. further evaluation with mri abdomen without and with contrast is recommended at this time to exclude hepatic cellular carcinoma.  Sequela of portal hypertension including prominent splenomegaly and a small volume ascites. stable small pericardial effusion and thickening. aortic atherosclerosis, coronary atherosclerosis.   ASSESSMENT & PLAN:  1. History of medullary carcinoma of thyroid   2. Pulmonary nodules/lesions, multiple   3. Liver lesion   4. Cirrhosis of liver without ascites, unspecified hepatic cirrhosis type ([place])   5. Anemia, unspecified type    Were discussed with patient, her caregiver and her daughter. For her lung nodules, currently they're very small and did not light up on the PET scan. Slow growing pulmonary metastasis cannot be excluded. Bilateral hilar lymphadenopathy also should be monitored. I recommend repeat a CT scan with contrast in 3 months.  # Regarding patient's liver lesion in cirrhotic liver, we will check AFP, and obtain MRI abdomen to further characterize the lesion. Etiology of cirrhosis possibly related to previous drinking history.   # Anemia and thrombocytopenia Possibly secondary to chronic liver disease. We'll repeat CBC, folate, B12, iron, ferritin,   #history of medullary thyroid cancer: we'll check CEA and a calcitonin level.  All etiology of cirrhosis possibly secondary to previous questions were answered. The patient and care giver know to call the clinic with any problems questions or concerns.  Return of visit:  after MRI of abdomen.  Thank you for this kind referral and the opportunity to participate in the  care of this patient. A copy of today's note is routed to referring provider    Earlie Server, MD, PhD Hematology Oncology [facility] at [facility] Pager- [phone] 12/15/2017

## 2017-12-15 NOTE — Progress Notes (Signed)
Patient here today as a new patient  

## 2017-12-15 NOTE — Progress Notes (Signed)
  Oncology Nurse Navigator Documentation  Navigator Location: [facility]-Med Onc (12/15/17 1200) Referral date to RadOnc/MedOnc: 12/01/17 (12/15/17 1200) )Navigator Encounter Type: Initial MedOnc (12/15/17 1200)   Abnormal Finding Date: 11/28/17 (12/15/17 1200)                   Treatment Phase: Abnormal Scans (12/15/17 1200) Barriers/Navigation Needs: Coordination of Care (12/15/17 1200)   Interventions: Coordination of Care (12/15/17 1200)   Coordination of Care: Appts;Radiology (12/15/17 1200)        Acuity: Level 1 (12/15/17 1200) Acuity Level 1: Initial guidance, education and coordination as needed;Minimal follow up required (12/15/17 1200)  met with patient and family during initial consultation with Dr. Tasia Catchings. All questions answered at the time of visit. Upcoming appts reviewed with pt's caregiver, Flonnie. Contact info given to pt and family. Instructed to call with any further questions or needs. Pt and family verbalized understanding. Nothing further needed at this time.     Time Spent with Patient: 60 (12/15/17 1200)

## 2017-12-16 DIAGNOSIS — D509 Iron deficiency anemia, unspecified: Secondary | ICD-10-CM | POA: Diagnosis not present

## 2017-12-16 DIAGNOSIS — D649 Anemia, unspecified: Secondary | ICD-10-CM | POA: Diagnosis not present

## 2017-12-16 DIAGNOSIS — K7460 Unspecified cirrhosis of liver: Secondary | ICD-10-CM | POA: Diagnosis not present

## 2017-12-16 DIAGNOSIS — K769 Liver disease, unspecified: Secondary | ICD-10-CM | POA: Diagnosis not present

## 2017-12-16 DIAGNOSIS — R918 Other nonspecific abnormal finding of lung field: Secondary | ICD-10-CM | POA: Diagnosis not present

## 2017-12-16 DIAGNOSIS — Z85850 Personal history of malignant neoplasm of thyroid: Secondary | ICD-10-CM | POA: Diagnosis not present

## 2017-12-16 DIAGNOSIS — D696 Thrombocytopenia, unspecified: Secondary | ICD-10-CM | POA: Diagnosis not present

## 2017-12-16 LAB — AFP TUMOR MARKER: AFP, SERUM, TUMOR MARKER: 6.9 ng/mL (ref 0.0–8.3)

## 2017-12-16 LAB — CALCITONIN: CALCITONIN: 258 pg/mL — AB (ref 0.0–5.0)

## 2017-12-16 LAB — CEA: CEA: 40.7 ng/mL — ABNORMAL HIGH (ref 0.0–4.7)

## 2017-12-17 DIAGNOSIS — D649 Anemia, unspecified: Secondary | ICD-10-CM | POA: Diagnosis not present

## 2017-12-17 DIAGNOSIS — I2510 Atherosclerotic heart disease of native coronary artery without angina pectoris: Secondary | ICD-10-CM | POA: Diagnosis not present

## 2017-12-17 DIAGNOSIS — K7460 Unspecified cirrhosis of liver: Secondary | ICD-10-CM | POA: Diagnosis not present

## 2017-12-17 DIAGNOSIS — Z85850 Personal history of malignant neoplasm of thyroid: Secondary | ICD-10-CM | POA: Diagnosis not present

## 2017-12-17 DIAGNOSIS — D509 Iron deficiency anemia, unspecified: Secondary | ICD-10-CM | POA: Diagnosis not present

## 2017-12-17 DIAGNOSIS — R918 Other nonspecific abnormal finding of lung field: Secondary | ICD-10-CM | POA: Diagnosis not present

## 2017-12-17 DIAGNOSIS — E785 Hyperlipidemia, unspecified: Secondary | ICD-10-CM | POA: Diagnosis not present

## 2017-12-17 DIAGNOSIS — K769 Liver disease, unspecified: Secondary | ICD-10-CM | POA: Diagnosis not present

## 2017-12-17 DIAGNOSIS — I10 Essential (primary) hypertension: Secondary | ICD-10-CM | POA: Diagnosis not present

## 2017-12-17 DIAGNOSIS — D696 Thrombocytopenia, unspecified: Secondary | ICD-10-CM | POA: Diagnosis not present

## 2017-12-17 DIAGNOSIS — Z9861 Coronary angioplasty status: Secondary | ICD-10-CM | POA: Diagnosis not present

## 2017-12-18 ENCOUNTER — Other Ambulatory Visit: Payer: Self-pay

## 2017-12-18 ENCOUNTER — Ambulatory Visit
Admission: RE | Admit: 2017-12-18 | Discharge: 2017-12-18 | Disposition: A | Discharge status: Home / Self Care | Payer: Medicare Other | Source: Ambulatory Visit | Attending: Oncology | Admitting: Oncology

## 2017-12-18 DIAGNOSIS — K7689 Other specified diseases of liver: Secondary | ICD-10-CM | POA: Diagnosis not present

## 2017-12-18 DIAGNOSIS — K7460 Unspecified cirrhosis of liver: Secondary | ICD-10-CM | POA: Diagnosis not present

## 2017-12-18 DIAGNOSIS — R918 Other nonspecific abnormal finding of lung field: Secondary | ICD-10-CM | POA: Diagnosis not present

## 2017-12-18 DIAGNOSIS — K769 Liver disease, unspecified: Secondary | ICD-10-CM | POA: Diagnosis not present

## 2017-12-18 DIAGNOSIS — Z85850 Personal history of malignant neoplasm of thyroid: Secondary | ICD-10-CM | POA: Diagnosis not present

## 2017-12-18 DIAGNOSIS — K766 Portal hypertension: Secondary | ICD-10-CM | POA: Diagnosis not present

## 2017-12-18 DIAGNOSIS — D649 Anemia, unspecified: Secondary | ICD-10-CM

## 2017-12-18 DIAGNOSIS — R188 Other ascites: Secondary | ICD-10-CM | POA: Diagnosis not present

## 2017-12-18 DIAGNOSIS — D696 Thrombocytopenia, unspecified: Secondary | ICD-10-CM | POA: Diagnosis not present

## 2017-12-18 DIAGNOSIS — D509 Iron deficiency anemia, unspecified: Secondary | ICD-10-CM | POA: Diagnosis not present

## 2017-12-18 DIAGNOSIS — R161 Splenomegaly, not elsewhere classified: Secondary | ICD-10-CM | POA: Insufficient documentation

## 2017-12-18 LAB — OCCULT BLOOD X 1 CARD TO LAB, STOOL
FECAL OCCULT BLD: NEGATIVE
FECAL OCCULT BLD: NEGATIVE
Fecal Occult Bld: NEGATIVE

## 2017-12-18 MED ORDER — GADOBENATE DIMEGLUMINE 529 MG/ML IV SOLN
15.0000 mL | Freq: Once | INTRAVENOUS | Status: AC | PRN
  Administered 2017-12-18: 12 mL via INTRAVENOUS

## 2017-12-22 NOTE — Progress Notes (Signed)
Hematology/Oncology  Follow up note [facility] Telephone:(336) [phone] Fax:(336) [phone]   Patient Care Team: Birdie Sons, MD as PCP - General (Family Medicine) Bary Castilla, Forest Gleason, MD (General Surgery) Schnier, Dolores Lory, MD (Vascular Surgery) Rainey Pines, MD as Referring Physician (Psychiatry) Isaias Cowman, MD as Consulting Physician (Cardiology) Vladimir Crofts, MD as Consulting Physician (Neurology) Jannet Mantis, MD as Consulting Physician (Dermatology) Telford Nab, RN as Registered Nurse   [place] [facility]  Follow up management of lung nodules, liver lesion, and history of Thyroid cancer.   HISTORY OF PRESENTING ILLNESS:  Margaret Romero is a  72 y.o.  female with PMH listed below who was referred to me for evaluation of lung nodule, liver lesion and history of thyroid cancer.  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 Most recently in August 2018 patient had a right inguinal mass that was evaluated by CT scan which also noted and incidentally pulmonary nodule. Dedicated chest CT was subsequently performed on 08/06/2017 which confirmed a 6 mm right middle lobe pulmonary nodule that was new from previous imaging in 2016. Follow-up CT scan completed on 11/28/2017 revealed multiple pulmonary nodules scattered throughout both lungs on you since 2016 and all slightly increasing size from the most recent CT that was done in September 2018. Patient also was noted to have a new intermediate left liver lobe lesion concerning for metastatic disease.  patient had a PET scan done recently prior to coming to see me for additional evaluation. Patient is accompanied by her caregiver and a daughter to the clinic today. Patient has dementia so most history was obtained from caregiver. Patient reports feeling well.  she is a former smoker and a former heavy drinker. Denies any cough, chest pain, abdominal pain. She is a good  appetite. Denies any recent weight loss.  INTERVAL HISTORY Margaret Romero is a 72 y.o. female who has above history reviewed by me today presents for follow up visit for management of lung nodules, liver lesion, and history of Thyroid cancer. Patient remains feeling well. She is accompanied by her daughter and caregiver. Patient denies any pain, chest pain, shortness of breath, or easy bruising. Appetite is good.   Review of Systems  Constitutional: Negative for chills, fever and malaise/fatigue.  HENT: Negative for ear pain and hearing loss.   Eyes: Negative for blurred vision and photophobia.  Respiratory: Negative for cough.   Cardiovascular: Negative for chest pain and orthopnea.  Gastrointestinal: Negative for abdominal pain and heartburn.  Genitourinary: Negative for dysuria.  Musculoskeletal: Negative for myalgias.  Skin: Negative for rash.  Neurological: Negative for dizziness, tingling and weakness.  Endo/Heme/Allergies: Does not bruise/bleed easily.  Psychiatric/Behavioral: Negative for depression and suicidal ideas.    MEDICAL HISTORY:  Past Medical History:  Diagnosis Date  . Bipolar affective ([place])   . Cancer [facility])    thyroid cancer   . Closed fracture carpal bone 11/16/2015  . GERD (gastroesophageal reflux disease)   . Head injury without fracture of skull   . Hypertension   . Malignant neoplasm of thyroid gland [facility]) January 23, 2011   medullary carcinoma thyroid T2, Nx.  . Memory changes   . Thyroid cancer [facility])     SURGICAL HISTORY: Past Surgical History:  Procedure Laterality Date  . CAROTID ENDARTERECTOMY Right November 2015   [name], MD  . [name]  2006  . COLONOSCOPY  2008  . CORONARY STENT PLACEMENT  2007  .  HERNIA REPAIR  2007  . NASAL SINUS SURGERY  2008  . SKIN CANCER EXCISION    . THYROIDECTOMY  2012    SOCIAL HISTORY: Social History   Socioeconomic History  . Marital status: Single    Spouse name: Not on file  .  Number of children: 1  . Years of education: Not on file  . Highest education level: Not on file  Social Needs  . Financial resource strain: Not on file  . Food insecurity - worry: Not on file  . Food insecurity - inability: Not on file  . Transportation needs - medical: Not on file  . Transportation needs - non-medical: Not on file  Occupational History  . Occupation: Retired  Tobacco Use  . Smoking status: Former Smoker    Packs/day: 1.00    Years: 10.00    Pack years: 10.00    Last attempt to quit: 12/01/1974    Years since quitting: 43.0  . Smokeless tobacco: Never Used  Substance and Sexual Activity  . Alcohol use: No  . Drug use: No  . Sexual activity: Not Currently  Other Topics Concern  . Not on file  Social History Narrative  . Not on file    FAMILY HISTORY: Family History  Problem Relation Age of Onset  . Bone cancer Sister   . Heart attack Brother   . Breast cancer Sister   . Lung cancer Sister   . Heart attack Daughter     ALLERGIES:  has No Known Allergies.  MEDICATIONS:  Current Outpatient Medications  Medication Sig Dispense Refill  . aspirin 81 MG tablet Take 81 mg by mouth daily.    Marland Kitchen donepezil (ARICEPT) 10 MG tablet Take 1 tablet (10 mg total) by mouth at bedtime. 90 tablet 2  . escitalopram (LEXAPRO) 10 MG tablet Take 1 tablet (10 mg total) by mouth daily. 90 tablet 4  . isosorbide mononitrate (IMDUR) 30 MG 24 hr tablet TAKE 1 TABLET BY MOUTH ONCE DAILY 90 tablet 4  . lamoTRIgine (LAMICTAL) 25 MG tablet Take 3 tablets (75 mg total) by mouth daily. 270 tablet 2  . levothyroxine (SYNTHROID, LEVOTHROID) 75 MCG tablet TAKE 1 TABLET BY MOUTH ONCE A DAY 90 tablet 3  . losartan (COZAAR) 50 MG tablet Take 1 tablet by mouth daily.    . mesalamine (LIALDA) 1.2 g EC tablet TAKE 2 TABLETS BY MOUTH ONCE A DAY WITH BREAKFAST 60 tablet 5  . Multiple Vitamins-Minerals (MULTIVITAMIN WITH MINERALS) tablet Take 1 tablet by mouth daily.    . pantoprazole (PROTONIX)  40 MG tablet TAKE 1 TABLET BY MOUTH ONCE A DAY 90 tablet 3  . simvastatin (ZOCOR) 40 MG tablet Take 1 tablet by mouth daily.    . Albuterol Sulfate (VENTOLIN HFA IN) Inhale 1 puff into the lungs as needed.     . nitroGLYCERIN (NITROSTAT) 0.4 MG SL tablet Place under the tongue.     No current facility-administered medications for this visit.      PHYSICAL EXAMINATION: ECOG PERFORMANCE STATUS: 1 - Symptomatic but completely ambulatory Vitals:   12/23/17 1100  BP: (!) 159/57  Pulse: 66  Resp: 20  Temp: 97.9 F (36.6 C)   There were no vitals filed for this visit.  Physical Exam  Constitutional: She is well-developed, well-nourished, and in no distress. No distress.  HENT:  Head: Normocephalic and atraumatic.  Mouth/Throat: No oropharyngeal exudate.  Eyes: Conjunctivae are normal. Pupils are equal, round, and reactive to light. No scleral icterus.  Neck: Normal range of motion. Neck supple. No thyromegaly present.  Cardiovascular: Normal rate and regular rhythm. Exam reveals no friction rub.  No murmur heard. Pulmonary/Chest: Effort normal and breath sounds normal. She has no wheezes.  Abdominal: Soft. Bowel sounds are normal. She exhibits no distension.  Musculoskeletal: Normal range of motion. She exhibits no edema.  Neurological: She is alert. No cranial nerve deficit.  Demented  Skin: Skin is warm and dry. There is erythema.  Psychiatric: Affect and judgment normal.     LABORATORY DATA:  I have reviewed the data as listed Lab Results  Component Value Date   WBC 2.9 (L) 12/15/2017   HGB 9.2 (L) 12/15/2017   HCT 29.2 (L) 12/15/2017   MCV 89.5 12/15/2017   PLT 78 (L) 12/15/2017   Recent Labs    06/25/17 1503 12/15/17 1127  NA 144 142  K 4.0 3.8  CL 106 108  CO2 26 26  GLUCOSE 76 114*  BUN 8 11  CREATININE 0.69 0.64  CALCIUM 8.5* 8.4*  GFRNONAA 87 >60  GFRAA 100 >60  PROT 6.1 6.3*  ALBUMIN 3.8 3.4*  AST 36 39  ALT 14 16  ALKPHOS 139* 109  BILITOT 1.1  1.0   RADIOGRAPHIC STUDIES: I have personally reviewed the radiological images as listed and agreed with the findings in the report. 12/12/2017 PET scan show no definite hypermetabolic met metastatic disease. Patient has scattered solid nodule in both lungs, largest 7 mm in the right middle lobe, all below pets resolution and not associated with significant metabolic. Slow growing pulmonary metastasis remaining on the differential. Continue CT chest surveillance recommended in 3 months. Nonspecific mild bilateral hilar hypermetabolic without discrete enlarged hilar nodes on the noncontrast CT images. These findings could be reactive for neoplastic. Follow-up CT in 3 months. Hepatic cirrhosis, small low attention lesion in segment 2 of the left liver lobe is hypermetabolic and appears new since 2016 mri abdomen. further evaluation with mri abdomen without and with contrast is recommended at this time to exclude hepatic cellular carcinoma.  Sequela of portal hypertension including prominent splenomegaly and a small volume ascites. stable small pericardial effusion and thickening. aortic atherosclerosis, coronary atherosclerosis.   MRI abdomen Multiple indeterminate hepatic lesions are demonstrated with restricted diffusion and early enhancement following contrast. No significant T2 hyperintensity or washout period these lesions appeared new compared with the 2016 MRI. Multifocal hepatocellular carcinoma unlikely given AFP normal. Multifocal FNH could have this appearance, although also less likely given non-visibility on previous studies. Percutaneous biopsy could be attempted if these lesions can be identified on ultrasound. Otherwise stable sequela of cirrhosis and portal hypertension with splenomegaly, multiple stable splenic lesions. Mild ascites.  ASSESSMENT & PLAN:  1. Pulmonary nodules/lesions, multiple   2. History of medullary carcinoma of thyroid   3. Liver lesion   4. Cirrhosis of liver  without ascites, unspecified hepatic cirrhosis type ([place])   5. Iron deficiency anemia, unspecified iron deficiency anemia type   6. Thrombocytopenia ([place])    Were discussed with patient, her caregiver and her daughter.  For her liver lesions, they are indeterminate on MRI abdomen. The only way to potentially get an answer is liver biopsy to obtain tissue diagnosis. Given her cirrhotic appearance of liver, underlying coagulopathy, thrombocytopenia, biopsy is of high leading risk. Observation and repeat scanning is another option. Discussed with patient's caregiver and she understands the risk and agrees with observation for now and repeat scanning future.  # For her  lung nodules, currently  they're very small and did not light up on the PET scan. Slow growing pulmonary metastasis cannot be excluded. Bilateral hilar lymphadenopathy also should be monitored. With discussed about repeating another CT scan in 3 months.  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 Currently no active bleeding.  # Liver cirrhosis/portal hypertension :I advised patient to continue follow-up with [place] [facility]. She is an established patient there.   # Anemia, ferritin is 11, likely iron deficiency from occult blood loss from GI, although stool occult is negative.  Will discuss about IV iron infusion in 2 weeks.    #history of medullary thyroid cancer: Elevated CEA and a calcitonin level. Patient has history ? colitis, which may contribute to elevated CEA.  Elevated Calcitonin is concerning for recurrence/metastatic medullary thyroid cancer. No neck soft tissue update.  Patient's case was discussed on tumor board on December 25, 2017.  Consensus decision is to continue following and repeat imaging scans in 3-6 months.  All questions were answered. The patient and care giver know to call the clinic with any problems questions or concerns. Total face to face encounter time  for this patient visit was 40 min. >50% of the time was  spent in counseling and coordination of care.  Return of visit:  2 weeks to discuss tumor board recommendation.    Earlie Server, MD, PhD Hematology Oncology [facility] Pager- [phone] 12/23/2017

## 2017-12-23 ENCOUNTER — Other Ambulatory Visit: Payer: Self-pay

## 2017-12-23 ENCOUNTER — Inpatient Hospital Stay (HOSPITAL_BASED_OUTPATIENT_CLINIC_OR_DEPARTMENT_OTHER): Payer: Medicare Other | Admitting: Oncology

## 2017-12-23 ENCOUNTER — Encounter: Payer: Self-pay | Admitting: Oncology

## 2017-12-23 VITALS — BP 159/57 | HR 66 | Temp 97.9°F | Resp 20

## 2017-12-23 DIAGNOSIS — D696 Thrombocytopenia, unspecified: Secondary | ICD-10-CM

## 2017-12-23 DIAGNOSIS — K7460 Unspecified cirrhosis of liver: Secondary | ICD-10-CM | POA: Diagnosis not present

## 2017-12-23 DIAGNOSIS — K769 Liver disease, unspecified: Secondary | ICD-10-CM

## 2017-12-23 DIAGNOSIS — Z85850 Personal history of malignant neoplasm of thyroid: Secondary | ICD-10-CM

## 2017-12-23 DIAGNOSIS — D509 Iron deficiency anemia, unspecified: Secondary | ICD-10-CM

## 2017-12-23 DIAGNOSIS — R918 Other nonspecific abnormal finding of lung field: Secondary | ICD-10-CM

## 2017-12-23 DIAGNOSIS — D649 Anemia, unspecified: Secondary | ICD-10-CM | POA: Diagnosis not present

## 2018-01-05 NOTE — Progress Notes (Signed)
Hematology/Oncology  Follow up note [facility] Telephone:(336) [phone] Fax:(336) [phone]   Patient Care Team: Birdie Sons, MD as PCP - General (Family Medicine) Bary Castilla, Forest Gleason, MD (General Surgery) Schnier, Dolores Lory, MD (Vascular Surgery) Rainey Pines, MD as Referring Physician (Psychiatry) Isaias Cowman, MD as Consulting Physician (Cardiology) Vladimir Crofts, MD as Consulting Physician (Neurology) Jannet Mantis, MD as Consulting Physician (Dermatology) Telford Nab, RN as Registered Nurse   [place] [facility]  Follow up management of lung nodules, liver lesion, and history of Thyroid cancer.   HISTORY OF PRESENTING ILLNESS:  Margaret Romero is a  72 y.o.  female with PMH listed below who was referred to me for evaluation of lung nodule, liver lesion and history of thyroid cancer.  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 Most recently in August 2018 patient had a right inguinal mass that was evaluated by CT scan which also noted and incidentally pulmonary nodule. Dedicated chest CT was subsequently performed on 08/06/2017 which confirmed a 6 mm right middle lobe pulmonary nodule that was new from previous imaging in 2016. Follow-up CT scan completed on 11/28/2017 revealed multiple pulmonary nodules scattered throughout both lungs on you since 2016 and all slightly increasing size from the most recent CT that was done in September 2018. Patient also was noted to have a new intermediate left liver lobe lesion concerning for metastatic disease.  patient had a PET scan done recently prior to coming to see me for additional evaluation. Patient is accompanied by her caregiver and a daughter to the clinic today. Patient has dementia so most history was obtained from caregiver. Patient reports feeling well.  she is a former smoker and a former heavy drinker. Denies any cough, chest pain, abdominal pain. She is a good  appetite. Denies any recent weight loss.  INTERVAL HISTORY Margaret Romero is a 72 y.o. female who has above history reviewed by me today presents for follow up visit for management of lung nodules, liver lesion, and history of Thyroid cancer.Patient remains feeling well.  She has good appetite and has a good performance status.  She is accompanied by her daughter and caregiver.  They are here to discuss about the panel decision from tumor board.  Denies any bleeding events, easy bruising, shortness of breath or chest pain, abdominal pain.  Review of Systems  Constitutional: Negative for chills, fever and malaise/fatigue.  HENT: Negative for ear pain and hearing loss.   Eyes: Negative for blurred vision and photophobia.  Respiratory: Negative for cough.   Cardiovascular: Negative for chest pain and orthopnea.  Gastrointestinal: Negative for abdominal pain and heartburn.  Genitourinary: Negative for dysuria.  Musculoskeletal: Negative for myalgias.  Skin: Negative for rash.  Neurological: Negative for dizziness, tingling and weakness.  Endo/Heme/Allergies: Does not bruise/bleed easily.  Psychiatric/Behavioral: Negative for depression and suicidal ideas.    MEDICAL HISTORY:  Past Medical History:  Diagnosis Date  . Bipolar affective ([place])   . Cancer [facility])    thyroid cancer   . Closed fracture carpal bone 11/16/2015  . GERD (gastroesophageal reflux disease)   . Head injury without fracture of skull   . Hypertension   . Malignant neoplasm of thyroid gland [facility]) January 23, 2011   medullary carcinoma thyroid T2, Nx.  . Memory changes   . Thyroid cancer [facility])     SURGICAL HISTORY: Past Surgical History:  Procedure Laterality Date  . CAROTID ENDARTERECTOMY Right November 2015  [name], MD  . [name]  2006  . COLONOSCOPY  2008  . CORONARY STENT PLACEMENT  2007  . HERNIA REPAIR  2007  . NASAL SINUS SURGERY  2008  . SKIN CANCER EXCISION    . THYROIDECTOMY  2012      SOCIAL HISTORY: Social History   Socioeconomic History  . Marital status: Single    Spouse name: Not on file  . Number of children: 1  . Years of education: Not on file  . Highest education level: Not on file  Social Needs  . Financial resource strain: Not on file  . Food insecurity - worry: Not on file  . Food insecurity - inability: Not on file  . Transportation needs - medical: Not on file  . Transportation needs - non-medical: Not on file  Occupational History  . Occupation: Retired  Tobacco Use  . Smoking status: Former Smoker    Packs/day: 1.00    Years: 10.00    Pack years: 10.00    Last attempt to quit: 12/01/1974    Years since quitting: 43.1  . Smokeless tobacco: Never Used  Substance and Sexual Activity  . Alcohol use: No  . Drug use: No  . Sexual activity: Not Currently  Other Topics Concern  . Not on file  Social History Narrative  . Not on file    FAMILY HISTORY: Family History  Problem Relation Age of Onset  . Bone cancer Sister   . Heart attack Brother   . Breast cancer Sister   . Lung cancer Sister   . Heart attack Daughter     ALLERGIES:  is allergic to propofol.  MEDICATIONS:  Current Outpatient Medications  Medication Sig Dispense Refill  . Albuterol Sulfate (VENTOLIN HFA IN) Inhale 1 puff into the lungs as needed.     Marland Kitchen aspirin 81 MG tablet Take 81 mg by mouth daily.    Marland Kitchen donepezil (ARICEPT) 10 MG tablet Take 1 tablet (10 mg total) by mouth at bedtime. 90 tablet 2  . escitalopram (LEXAPRO) 10 MG tablet Take 1 tablet (10 mg total) by mouth daily. 90 tablet 4  . isosorbide mononitrate (IMDUR) 30 MG 24 hr tablet TAKE 1 TABLET BY MOUTH ONCE DAILY 90 tablet 4  . lamoTRIgine (LAMICTAL) 25 MG tablet Take 3 tablets (75 mg total) by mouth daily. 270 tablet 2  . levothyroxine (SYNTHROID, LEVOTHROID) 75 MCG tablet TAKE 1 TABLET BY MOUTH ONCE A DAY 90 tablet 3  . losartan (COZAAR) 50 MG tablet Take 1 tablet by mouth daily.    . mesalamine  (LIALDA) 1.2 g EC tablet TAKE 2 TABLETS BY MOUTH ONCE A DAY WITH BREAKFAST 60 tablet 5  . Multiple Vitamins-Minerals (MULTIVITAMIN WITH MINERALS) tablet Take 1 tablet by mouth daily.    . nitroGLYCERIN (NITROSTAT) 0.4 MG SL tablet Place under the tongue.    . pantoprazole (PROTONIX) 40 MG tablet TAKE 1 TABLET BY MOUTH ONCE A DAY 90 tablet 3  . simvastatin (ZOCOR) 40 MG tablet Take 1 tablet by mouth daily.     No current facility-administered medications for this visit.      PHYSICAL EXAMINATION: ECOG PERFORMANCE STATUS: 1 - Symptomatic but completely ambulatory There were no vitals filed for this visit. There were no vitals filed for this visit.  Physical Exam  Constitutional: She is well-developed, well-nourished, and in no distress. No distress.  HENT:  Head: Normocephalic and atraumatic.  Mouth/Throat: No oropharyngeal exudate.  Eyes: Conjunctivae are normal. Pupils are equal,  round, and reactive to light. No scleral icterus.  Neck: Normal range of motion. Neck supple. No thyromegaly present.  Cardiovascular: Normal rate and regular rhythm. Exam reveals no friction rub.  No murmur heard. Pulmonary/Chest: Effort normal and breath sounds normal. She has no wheezes.  Abdominal: Soft. Bowel sounds are normal. She exhibits no distension.  Musculoskeletal: Normal range of motion. She exhibits no edema.  Neurological: She is alert. No cranial nerve deficit.  Demented  Skin: Skin is warm and dry. No erythema.  Psychiatric: Affect and judgment normal.     LABORATORY DATA:  I have reviewed the data as listed Lab Results  Component Value Date   WBC 2.9 (L) 12/15/2017   HGB 9.2 (L) 12/15/2017   HCT 29.2 (L) 12/15/2017   MCV 89.5 12/15/2017   PLT 78 (L) 12/15/2017   Recent Labs    06/25/17 1503 12/15/17 1127  NA 144 142  K 4.0 3.8  CL 106 108  CO2 26 26  GLUCOSE 76 114*  BUN 8 11  CREATININE 0.69 0.64  CALCIUM 8.5* 8.4*  GFRNONAA 87 >60  GFRAA 100 >60  PROT 6.1 6.3*    ALBUMIN 3.8 3.4*  AST 36 39  ALT 14 16  ALKPHOS 139* 109  BILITOT 1.1 1.0   RADIOGRAPHIC STUDIES: I have personally reviewed the radiological images as listed and agreed with the findings in the report. 12/12/2017 PET scan show no definite hypermetabolic met metastatic disease. Patient has scattered solid nodule in both lungs, largest 7 mm in the right middle lobe, all below pets resolution and not associated with significant metabolic. Slow growing pulmonary metastasis remaining on the differential. Continue CT chest surveillance recommended in 3 months. Nonspecific mild bilateral hilar hypermetabolic without discrete enlarged hilar nodes on the noncontrast CT images. These findings could be reactive for neoplastic. Follow-up CT in 3 months. Hepatic cirrhosis, small low attention lesion in segment 2 of the left liver lobe is hypermetabolic and appears new since 2016 mri abdomen. further evaluation with mri abdomen without and with contrast is recommended at this time to exclude hepatic cellular carcinoma.  Sequela of portal hypertension including prominent splenomegaly and a small volume ascites. stable small pericardial effusion and thickening. aortic atherosclerosis, coronary atherosclerosis.   MRI abdomen Multiple indeterminate hepatic lesions are demonstrated with restricted diffusion and early enhancement following contrast. No significant T2 hyperintensity or washout period these lesions appeared new compared with the 2016 MRI. Multifocal hepatocellular carcinoma unlikely given AFP normal. Multifocal FNH could have this appearance, although also less likely given non-visibility on previous studies. Percutaneous biopsy could be attempted if these lesions can be identified on ultrasound. Otherwise stable sequela of cirrhosis and portal hypertension with splenomegaly, multiple stable splenic lesions. Mild ascites.  ASSESSMENT & PLAN:  1. Pulmonary nodules/lesions, multiple   2. Alcoholic  cirrhosis of liver with ascites ([place])   3. History of medullary carcinoma of thyroid   Patient's case was discussed on tumor board on December 25, 2017.   with a tumor board panel decision that given patient's multiple comorbidity, continuing watchful waiting is a reasonable option.  Plan repeat CT of chest and MRI abdomen in 3 months.  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 # For her  lung nodules, currently they're very small and did not light up on the PET scan. Slow growing pulmonary metastasis cannot be excluded. Bilateral hilar lymphadenopathy also should be monitored. With discussed about repeating another CT scan in 3  months.  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 Currently no active bleeding.  # Liver cirrhosis/portal hypertension :I advised patient to continue follow-up with [place] [facility].  She has an appointment with GI physician in April.  # Anemia, ferritin is 11, likely iron deficiency from occult blood loss from GI, although stool occult is negative.  Discussed with patient's daughter over the phone. Will start patient on Slow Iron 163m daily.  Check iron panel prior to next follow up.   All questions were answered. The patient and care giver know to call the clinic with any problems questions or concerns.  Return of visit:  3-4 months.    ZEarlie Server MD, PhD [facility]ematology On[facility] Hospital[facility]ager- [phone]/04/2018

## 2018-01-06 ENCOUNTER — Other Ambulatory Visit: Payer: Self-pay

## 2018-01-06 ENCOUNTER — Encounter: Payer: Self-pay | Admitting: Oncology

## 2018-01-06 ENCOUNTER — Inpatient Hospital Stay: Payer: Medicare Other | Attending: Oncology | Admitting: Oncology

## 2018-01-06 VITALS — BP 151/61 | HR 67 | Temp 97.6°F | Wt 134.0 lb

## 2018-01-06 DIAGNOSIS — D696 Thrombocytopenia, unspecified: Secondary | ICD-10-CM

## 2018-01-06 DIAGNOSIS — K769 Liver disease, unspecified: Secondary | ICD-10-CM | POA: Diagnosis not present

## 2018-01-06 DIAGNOSIS — Z85850 Personal history of malignant neoplasm of thyroid: Secondary | ICD-10-CM | POA: Insufficient documentation

## 2018-01-06 DIAGNOSIS — D649 Anemia, unspecified: Secondary | ICD-10-CM | POA: Insufficient documentation

## 2018-01-06 DIAGNOSIS — K766 Portal hypertension: Secondary | ICD-10-CM | POA: Diagnosis not present

## 2018-01-06 DIAGNOSIS — K7031 Alcoholic cirrhosis of liver with ascites: Secondary | ICD-10-CM | POA: Insufficient documentation

## 2018-01-06 DIAGNOSIS — R918 Other nonspecific abnormal finding of lung field: Secondary | ICD-10-CM | POA: Diagnosis not present

## 2018-01-06 MED ORDER — FERROUS SULFATE DRIED ER 160 (50 FE) MG PO TBCR: 160.0000 mg | EXTENDED_RELEASE_TABLET | Freq: Every day | ORAL | 3 refills | Status: DC

## 2018-02-16 ENCOUNTER — Other Ambulatory Visit: Payer: Self-pay | Admitting: Family Medicine

## 2018-03-02 ENCOUNTER — Other Ambulatory Visit
Admission: RE | Admit: 2018-03-02 | Discharge: 2018-03-02 | Disposition: A | Discharge status: Home / Self Care | Payer: Medicare Other | Source: Ambulatory Visit | Attending: Nurse Practitioner | Admitting: Nurse Practitioner

## 2018-03-02 DIAGNOSIS — K7031 Alcoholic cirrhosis of liver with ascites: Secondary | ICD-10-CM | POA: Diagnosis present

## 2018-03-02 DIAGNOSIS — Z955 Presence of coronary angioplasty implant and graft: Secondary | ICD-10-CM | POA: Diagnosis not present

## 2018-03-02 DIAGNOSIS — D61818 Other pancytopenia: Secondary | ICD-10-CM | POA: Diagnosis not present

## 2018-03-02 DIAGNOSIS — K5190 Ulcerative colitis, unspecified, without complications: Secondary | ICD-10-CM | POA: Diagnosis not present

## 2018-03-02 DIAGNOSIS — D509 Iron deficiency anemia, unspecified: Secondary | ICD-10-CM | POA: Diagnosis not present

## 2018-03-02 LAB — AMMONIA: Ammonia: 38 umol/L — ABNORMAL HIGH (ref 9–35)

## 2018-03-06 ENCOUNTER — Inpatient Hospital Stay
Admission: EM | Admit: 2018-03-06 | Discharge: 2018-03-10 | DRG: 369 | Disposition: A | Discharge status: Home Health | Payer: Medicare Other | Attending: Internal Medicine | Admitting: Internal Medicine

## 2018-03-06 ENCOUNTER — Emergency Department: Payer: Medicare Other

## 2018-03-06 ENCOUNTER — Encounter: Payer: Self-pay | Admitting: Emergency Medicine

## 2018-03-06 DIAGNOSIS — I864 Gastric varices: Secondary | ICD-10-CM | POA: Diagnosis present

## 2018-03-06 DIAGNOSIS — F319 Bipolar disorder, unspecified: Secondary | ICD-10-CM | POA: Diagnosis present

## 2018-03-06 DIAGNOSIS — R0902 Hypoxemia: Secondary | ICD-10-CM

## 2018-03-06 DIAGNOSIS — N390 Urinary tract infection, site not specified: Secondary | ICD-10-CM | POA: Diagnosis not present

## 2018-03-06 DIAGNOSIS — Z85850 Personal history of malignant neoplasm of thyroid: Secondary | ICD-10-CM

## 2018-03-06 DIAGNOSIS — Z85828 Personal history of other malignant neoplasm of skin: Secondary | ICD-10-CM

## 2018-03-06 DIAGNOSIS — K7460 Unspecified cirrhosis of liver: Secondary | ICD-10-CM | POA: Diagnosis present

## 2018-03-06 DIAGNOSIS — K766 Portal hypertension: Secondary | ICD-10-CM | POA: Diagnosis present

## 2018-03-06 DIAGNOSIS — Z87891 Personal history of nicotine dependence: Secondary | ICD-10-CM

## 2018-03-06 DIAGNOSIS — K922 Gastrointestinal hemorrhage, unspecified: Secondary | ICD-10-CM

## 2018-03-06 DIAGNOSIS — J189 Pneumonia, unspecified organism: Secondary | ICD-10-CM | POA: Diagnosis not present

## 2018-03-06 DIAGNOSIS — I8501 Esophageal varices with bleeding: Secondary | ICD-10-CM | POA: Diagnosis not present

## 2018-03-06 DIAGNOSIS — Z79890 Hormone replacement therapy (postmenopausal): Secondary | ICD-10-CM

## 2018-03-06 DIAGNOSIS — I119 Hypertensive heart disease without heart failure: Secondary | ICD-10-CM | POA: Diagnosis present

## 2018-03-06 DIAGNOSIS — E785 Hyperlipidemia, unspecified: Secondary | ICD-10-CM | POA: Diagnosis not present

## 2018-03-06 DIAGNOSIS — Z79899 Other long term (current) drug therapy: Secondary | ICD-10-CM

## 2018-03-06 DIAGNOSIS — Z955 Presence of coronary angioplasty implant and graft: Secondary | ICD-10-CM

## 2018-03-06 DIAGNOSIS — I10 Essential (primary) hypertension: Secondary | ICD-10-CM | POA: Diagnosis not present

## 2018-03-06 DIAGNOSIS — I2510 Atherosclerotic heart disease of native coronary artery without angina pectoris: Secondary | ICD-10-CM | POA: Diagnosis not present

## 2018-03-06 DIAGNOSIS — Z87820 Personal history of traumatic brain injury: Secondary | ICD-10-CM | POA: Diagnosis not present

## 2018-03-06 DIAGNOSIS — Z7982 Long term (current) use of aspirin: Secondary | ICD-10-CM

## 2018-03-06 DIAGNOSIS — D62 Acute posthemorrhagic anemia: Secondary | ICD-10-CM | POA: Diagnosis present

## 2018-03-06 DIAGNOSIS — Z8249 Family history of ischemic heart disease and other diseases of the circulatory system: Secondary | ICD-10-CM

## 2018-03-06 DIAGNOSIS — K920 Hematemesis: Secondary | ICD-10-CM | POA: Diagnosis not present

## 2018-03-06 DIAGNOSIS — D649 Anemia, unspecified: Secondary | ICD-10-CM | POA: Diagnosis not present

## 2018-03-06 DIAGNOSIS — R0689 Other abnormalities of breathing: Secondary | ICD-10-CM | POA: Diagnosis present

## 2018-03-06 DIAGNOSIS — E890 Postprocedural hypothyroidism: Secondary | ICD-10-CM | POA: Diagnosis present

## 2018-03-06 DIAGNOSIS — Z9049 Acquired absence of other specified parts of digestive tract: Secondary | ICD-10-CM

## 2018-03-06 DIAGNOSIS — J9811 Atelectasis: Secondary | ICD-10-CM | POA: Diagnosis present

## 2018-03-06 DIAGNOSIS — F1021 Alcohol dependence, in remission: Secondary | ICD-10-CM | POA: Diagnosis present

## 2018-03-06 DIAGNOSIS — I8500 Esophageal varices without bleeding: Secondary | ICD-10-CM | POA: Diagnosis not present

## 2018-03-06 DIAGNOSIS — F419 Anxiety disorder, unspecified: Secondary | ICD-10-CM | POA: Diagnosis present

## 2018-03-06 DIAGNOSIS — F0390 Unspecified dementia without behavioral disturbance: Secondary | ICD-10-CM | POA: Diagnosis present

## 2018-03-06 DIAGNOSIS — I739 Peripheral vascular disease, unspecified: Secondary | ICD-10-CM | POA: Diagnosis present

## 2018-03-06 DIAGNOSIS — Z884 Allergy status to anesthetic agent status: Secondary | ICD-10-CM

## 2018-03-06 DIAGNOSIS — K219 Gastro-esophageal reflux disease without esophagitis: Secondary | ICD-10-CM

## 2018-03-06 DIAGNOSIS — J90 Pleural effusion, not elsewhere classified: Secondary | ICD-10-CM | POA: Diagnosis not present

## 2018-03-06 DIAGNOSIS — K5190 Ulcerative colitis, unspecified, without complications: Secondary | ICD-10-CM | POA: Diagnosis present

## 2018-03-06 HISTORY — DX: Occlusion and stenosis of unspecified carotid artery: I65.29

## 2018-03-06 HISTORY — DX: Unspecified dementia, unspecified severity, without behavioral disturbance, psychotic disturbance, mood disturbance, and anxiety: F03.90

## 2018-03-06 HISTORY — DX: Esophageal varices without bleeding: I85.00

## 2018-03-06 HISTORY — DX: Unspecified cirrhosis of liver: K74.60

## 2018-03-06 LAB — COMPREHENSIVE METABOLIC PANEL
ALBUMIN: 3 g/dL — AB (ref 3.5–5.0)
ALK PHOS: 77 U/L (ref 38–126)
ALT: 10 U/L — ABNORMAL LOW (ref 14–54)
AST: 30 U/L (ref 15–41)
Anion gap: 7 (ref 5–15)
BILIRUBIN TOTAL: 1.4 mg/dL — AB (ref 0.3–1.2)
BUN: 24 mg/dL — ABNORMAL HIGH (ref 6–20)
CO2: 23 mmol/L (ref 22–32)
Calcium: 8.1 mg/dL — ABNORMAL LOW (ref 8.9–10.3)
Chloride: 111 mmol/L (ref 101–111)
Creatinine, Ser: 0.69 mg/dL (ref 0.44–1.00)
GFR calc Af Amer: >60 mL/min (ref >60)
GFR calc non Af Amer: >60 mL/min (ref >60)
GLUCOSE: 124 mg/dL — AB (ref 65–99)
POTASSIUM: 3.7 mmol/L (ref 3.5–5.1)
SODIUM: 141 mmol/L (ref 135–145)
TOTAL PROTEIN: 5.4 g/dL — AB (ref 6.5–8.1)

## 2018-03-06 LAB — CBC
HEMATOCRIT: 30.2 % — AB (ref 35.0–47.0)
HEMOGLOBIN: 10.1 g/dL — AB (ref 12.0–16.0)
MCH: 31.7 pg (ref 26.0–34.0)
MCHC: 33.3 g/dL (ref 32.0–36.0)
MCV: 95.1 fL (ref 80.0–100.0)
Platelets: 82 10*3/uL — ABNORMAL LOW (ref 150–440)
RBC: 3.18 MIL/uL — ABNORMAL LOW (ref 3.80–5.20)
RDW: 19.2 % — AB (ref 11.5–14.5)
WBC: 6.2 10*3/uL (ref 3.6–11.0)

## 2018-03-06 LAB — TYPE AND SCREEN
ABO/RH(D): A POS
ANTIBODY SCREEN: NEGATIVE

## 2018-03-06 LAB — LIPASE, BLOOD: Lipase: 80 U/L — ABNORMAL HIGH (ref 11–51)

## 2018-03-06 MED ORDER — PANTOPRAZOLE SODIUM 40 MG IV SOLR
INTRAVENOUS | Status: AC
  Administered 2018-03-06: 40 mg via INTRAVENOUS
  Filled 2018-03-06: qty 40

## 2018-03-06 MED ORDER — OCTREOTIDE LOAD VIA INFUSION
50.0000 ug | Freq: Once | INTRAVENOUS | Status: AC
  Administered 2018-03-06: 50 ug via INTRAVENOUS
  Filled 2018-03-06 (×2): qty 25

## 2018-03-06 MED ORDER — PANTOPRAZOLE SODIUM 40 MG IV SOLR
40.0000 mg | Freq: Once | INTRAVENOUS | Status: AC
  Administered 2018-03-06: 40 mg via INTRAVENOUS

## 2018-03-06 MED ORDER — ONDANSETRON HCL 4 MG/2ML IJ SOLN
4.0000 mg | Freq: Once | INTRAMUSCULAR | Status: AC | PRN
  Administered 2018-03-06: 4 mg via INTRAVENOUS

## 2018-03-06 MED ORDER — SODIUM CHLORIDE 0.9 % IV SOLN
50.0000 ug/h | INTRAVENOUS | Status: DC
  Administered 2018-03-06 – 2018-03-09 (×7): 50 ug/h via INTRAVENOUS
  Filled 2018-03-06 (×15): qty 1

## 2018-03-06 MED ORDER — ONDANSETRON HCL 4 MG/2ML IJ SOLN
INTRAMUSCULAR | Status: AC
  Administered 2018-03-06: 4 mg via INTRAVENOUS
  Filled 2018-03-06: qty 2

## 2018-03-06 MED ORDER — SODIUM CHLORIDE 0.9 % IV BOLUS
1000.0000 mL | Freq: Once | INTRAVENOUS | Status: AC
  Administered 2018-03-06: 1000 mL via INTRAVENOUS

## 2018-03-06 NOTE — ED Triage Notes (Signed)
FIRST NURSE NOTE-vomit blood today about 30 minutes prior to arrival.  Alert. NAD. No blood thinners.

## 2018-03-06 NOTE — ED Notes (Signed)
Called and gave report to receiving nurse Margreta Journey, RN.  Per Margreta Journey, pt will be going to room 128 which is being stat cleaned.  Oncoming ED nurse to be informed at shift change at 11pm

## 2018-03-06 NOTE — ED Triage Notes (Signed)
Pt states she had a single instance of hematemesis just PTA.  Pt family reports cirrhosis of the liver and denies being on blood thinners.  Pt in NAD at this time but appears anxious.

## 2018-03-06 NOTE — ED Provider Notes (Signed)
[facility] Emergency Department Provider Note  ____________________________________________  Time seen: Approximately 8:40 PM  I have reviewed the triage vital signs and the nursing notes.   HISTORY  Chief Complaint Hematemesis    HPI Margaret Romero is a 72 y.o. female comes the ED complaining of hematemesis. She has a history of thyroid cancer GERD and cirrhosis of the liver. This evening she was driving and suddenly had the urge to vomit. She pulled over and vomited a large amount of blood. She then came straight to the emergency room, and on arrival to the treatment room again vomited a large amount of blood. She denies black or bloody stool, denies dizziness syncope chest pain or shortness of breath. No fever chills or abdominal pain. Symptoms are intermittent, no aggravating or alleviating factors. No blood thinners.     Past Medical History:  Diagnosis Date  . Bipolar affective ([place])   . Cancer [facility])    thyroid cancer   . Closed fracture carpal bone 11/16/2015  . GERD (gastroesophageal reflux disease)   . Head injury without fracture of skull   . Hypertension   . Malignant neoplasm of thyroid gland [facility]) January 23, 2011   medullary carcinoma thyroid T2, Nx.  . Memory changes   . Thyroid cancer [facility])      Patient Active Problem List   Diagnosis Date Noted  . Cirrhosis ([place]) 03/06/2018  . Hematemesis 03/06/2018  . Liver nodule 12/01/2017  . Inguinal hernia of right side without obstruction or gangrene 07/25/2017  . Multiple lung nodules on CT 07/14/2017  . GERD (gastroesophageal reflux disease)   . Bilateral carotid artery stenosis 05/20/2017  . Benign meningioma of brain ([place]) 04/10/2017  . Osteopenia 07/12/2016  . Nodular hyperplasia of liver 07/03/2016  . Splenomegaly 06/11/2016  . Thrombocytopenia ([place]) 06/11/2016  . Bipolar affective disorder ([place]) 01/22/2016  . Pancytopenia ([place]) 11/20/2015  . Skin lesion 11/20/2015  . Disease  of skin and subcutaneous tissue 11/20/2015  . Body mass index (BMI) of 22.0-22.9 in adult 11/16/2015  . Cancer of thyroid ([place]) 11/16/2015  . Clinical depression 11/16/2015  . 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 Presence of stent in coronary artery 11/16/2015  . HLD (hyperlipidemia) 11/16/2015  . Bad memory 11/16/2015  . 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 CAD in native artery 11/16/2015  . Diverticulitis 11/16/2015  . Major depressive disorder with single episode 11/16/2015  . Gastroenteritis, non-infectious 11/16/2015  . History of cardiovascular surgery 11/16/2015  . Occlusion and stenosis of unspecified carotid artery 11/16/2015  . Anxiety disorder 08/14/2015  . Hypertension 06/13/2015  . Ulcerative colitis ([place]) 06/01/2015  . Fecal occult blood test positive 12/15/2014  . SOB (shortness of breath) on exertion 11/01/2014  . Hypoxemia 11/01/2014  . Personal history of malignant neoplasm of thyroid 01/13/2014  . IBS (irritable bowel syndrome) 12/02/1998     Past Surgical History:  Procedure Laterality Date  . CAROTID ENDARTERECTOMY Right November 2015   [name], MD  . [name]  2006  . COLONOSCOPY  2008  . CORONARY STENT PLACEMENT  2007  . HERNIA REPAIR  2007  . NASAL SINUS SURGERY  2008  . SKIN CANCER EXCISION    . THYROIDECTOMY  2012     Prior to Admission medications   Medication Sig Start Date End Date Taking? Authorizing Provider  Albuterol Sulfate (VENTOLIN HFA IN) Inhale 1 puff into the lungs as needed.     [provider]  aspirin  81 MG tablet Take 81 mg by mouth daily.    [provider]  donepezil (ARICEPT) 10 MG tablet Take 1 tablet (10 mg total) by mouth at bedtime. 11/17/17 02/15/18  Rainey Pines, MD  escitalopram (LEXAPRO) 10 MG tablet Take 1 tablet (10 mg total) by mouth daily. 11/17/17   Rainey Pines, MD  ferrous sulfate (SLOW FE) 160 (50 Fe) MG TBCR SR tablet Take 1 tablet (160 mg total) by mouth  daily. 01/06/18   Earlie Server, MD  isosorbide mononitrate (IMDUR) 30 MG 24 hr tablet TAKE 1 TABLET BY MOUTH ONCE DAILY 12/08/17   Birdie Sons, MD  lamoTRIgine (LAMICTAL) 25 MG tablet Take 3 tablets (75 mg total) by mouth daily. 11/17/17   Rainey Pines, MD  levothyroxine (SYNTHROID, LEVOTHROID) 75 MCG tablet TAKE 1 TABLET BY MOUTH ONCE A DAY 07/08/17   Byrnett, Forest Gleason, MD  losartan (COZAAR) 50 MG tablet Take 1 tablet by mouth daily. 12/23/13   [provider]  mesalamine (LIALDA) 1.2 g EC tablet TAKE 2 TABLETS BY MOUTH ONCE A DAY WITH BREAKFAST 02/16/18   Birdie Sons, MD  Multiple Vitamins-Minerals (MULTIVITAMIN WITH MINERALS) tablet Take 1 tablet by mouth daily.    [provider]  nitroGLYCERIN (NITROSTAT) 0.4 MG SL tablet Place under the tongue. 12/17/10   [provider]  pantoprazole (PROTONIX) 40 MG tablet TAKE 1 TABLET BY MOUTH ONCE A DAY 12/01/17   Birdie Sons, MD  simvastatin (ZOCOR) 40 MG tablet Take 1 tablet by mouth daily. 01/11/14   [provider]     Allergies Propofol   Family History  Problem Relation Age of Onset  . Bone cancer Sister   . Heart attack Brother   . Breast cancer Sister   . Lung cancer Sister   . Heart attack Daughter     Social History Social History   Tobacco Use  . Smoking status: Former Smoker    Packs/day: 1.00    Years: 10.00    Pack years: 10.00    Last attempt to quit: 12/01/1974    Years since quitting: 43.2  . Smokeless tobacco: Never Used  Substance Use Topics  . Alcohol use: No  . Drug use: No    Review of Systems  Constitutional:   No fever or chills.  ENT:   No sore throat. No rhinorrhea. Cardiovascular:   No chest pain or syncope. Respiratory:   No dyspnea or cough. Gastrointestinal:   Negative for abdominal pain,positive hematemesis Musculoskeletal:   Negative for focal pain or swelling All other systems reviewed and are negative except as documented above in ROS and  HPI.  ____________________________________________   PHYSICAL EXAM:  VITAL SIGNS: ED Triage Vitals  Enc Vitals Group     BP 03/06/18 1930 (!) 151/72     Pulse Rate 03/06/18 1903 67     Resp 03/06/18 1903 18     Temp 03/06/18 1903 97.7 F (36.5 C)     Temp Source 03/06/18 1903 Oral     SpO2 03/06/18 1903 100 %     Weight --      Height --      Head Circumference --      Peak Flow --      Pain Score --      Pain Loc --      Pain Edu? --      Excl. in [place]? --     Vital signs reviewed, nursing assessments reviewed.   Constitutional:  Alert and oriented. not in distress Eyes:  positive conjunctival pallor. EOMI. PERRL. ENT      Head:   Normocephalic and atraumatic.      Nose:   No congestion/rhinnorhea.       Mouth/Throat:   MMM, no pharyngeal erythema. No peritonsillar mass.       Neck:   No meningismus. Full ROM. Hematological/Lymphatic/Immunilogical:   No cervical lymphadenopathy. Cardiovascular:   RRR. Symmetric bilateral radial and DP pulses.  No murmurs.  Respiratory:   Normal respiratory effort without tachypnea/retractions. Breath sounds are clear and equal bilaterally. No wheezes/rales/rhonchi. Gastrointestinal:   Soft and nontender. Non distended. There is no CVA tenderness.  No rebound, rigidity, or guarding.rectal exam performed with nurse Altha Harm at bedside, black stool, Hemoccult negative, controls okay Musculoskeletal:   Normal range of motion in all extremities. No joint effusions.  No lower extremity tenderness.  No edema. Neurologic:   Normal speech and language.  Motor grossly intact. No acute focal neurologic deficits are appreciated.  Skin:    Skin is warm, dry and intact. No rash noted.  No petechiae, purpura, or bullae.  ____________________________________________    LABS (pertinent positives/negatives) (all labs ordered are listed, but only abnormal results are displayed) Labs Reviewed  LIPASE, BLOOD - Abnormal; Notable for the following  components:      Result Value   Lipase 80 (*)    All other components within normal limits  COMPREHENSIVE METABOLIC PANEL - Abnormal; Notable for the following components:   Glucose, Bld 124 (*)    BUN 24 (*)    Calcium 8.1 (*)    Total Protein 5.4 (*)    Albumin 3.0 (*)    ALT 10 (*)    Total Bilirubin 1.4 (*)    All other components within normal limits  CBC - Abnormal; Notable for the following components:   RBC 3.18 (*)    Hemoglobin 10.1 (*)    HCT 30.2 (*)    RDW 19.2 (*)    Platelets 82 (*)    All other components within normal limits  URINALYSIS, COMPLETE (UACMP) WITH MICROSCOPIC  TYPE AND SCREEN   ____________________________________________   EKG  interpreted by me Sinus rhythm rate of 61, normal intervals, normal QRS ST segments and T waves. Possible limb lead reversal.  ____________________________________________    RADIOLOGY  No results found.  ____________________________________________   PROCEDURES Procedures  ____________________________________________  DIFFERENTIAL DIAGNOSIS   upper GI bleed, esophageal varices, peptic ulcer disease  CLINICAL IMPRESSION / ASSESSMENT AND PLAN / ED COURSE  Pertinent labs & imaging results that were available during my care of the patient were reviewed by me and considered in my medical decision making (see chart for details).    patient not in distress but presents with 2 episodes of hematemesis in the setting of underlying liver cirrhosis. Currently vital signs are unremarkable, hemoglobin is stable compared to recent baseline. Not in shock, not requiring emergent transfusion, but we will hospitalize for vital sign and hemoglobin monitoring as well as further GI evaluation      ____________________________________________   FINAL CLINICAL IMPRESSION(S) / ED DIAGNOSES    Final diagnoses:  Upper GI bleed     ED Discharge Orders    None      Portions of this note were generated with dragon  dictation software. Dictation errors may occur despite best attempts at proofreading.    Carrie Mew, MD 03/06/18 2152

## 2018-03-06 NOTE — ED Notes (Signed)
Pt had 693m bloody emesis

## 2018-03-06 NOTE — H&P (Signed)
[place] [facility] [place] NAME: Margaret Romero    MR#:  [id number]  DATE OF BIRTH:  [birth year]  DATE OF ADMISSION:  03/06/2018  PRIMARY CARE PHYSICIAN: Birdie Sons, MD   REQUESTING/REFERRING PHYSICIAN: Joni Fears, MD  CHIEF COMPLAINT:   Chief Complaint  Patient presents with  . Hematemesis    HISTORY OF PRESENT ILLNESS:  Margaret Romero  is a 72 y.o. female who presents with 2 episodes of reported large volume hematemesis today.  Patient has a known history of cirrhosis, but states she has never been diagnosed with esophageal varices.  She follows with GI in the outpatient setting and had a plan for EGD within the next few months.  Her hemoglobin is stable here, but given her significant hematemesis hospitalist were called for admission.  PAST MEDICAL HISTORY:   Past Medical History:  Diagnosis Date  . Bipolar affective (Margaret Romero)   . Cancer [facility])    thyroid cancer   . Closed fracture carpal bone 11/16/2015  . GERD (gastroesophageal reflux disease)   . Head injury without fracture of skull   . Hypertension   . Malignant neoplasm of thyroid gland [facility]) January 23, 2011   medullary carcinoma thyroid T2, Nx.  . Memory changes   . Thyroid cancer (Margaret Romero)      PAST SURGICAL HISTORY:   Past Surgical History:  Procedure Laterality Date  . CAROTID ENDARTERECTOMY Right November 2015   [name], MD  . [name]  2006  . COLONOSCOPY  2008  . CORONARY STENT PLACEMENT  2007  . HERNIA REPAIR  2007  . NASAL SINUS SURGERY  2008  . SKIN CANCER EXCISION    . THYROIDECTOMY  2012     SOCIAL HISTORY:   Social History   Tobacco Use  . Smoking status: Former Smoker    Packs/day: 1.00    Years: 10.00    Pack years: 10.00    Last attempt to quit: 12/01/1974    Years since quitting: 43.2  . Smokeless tobacco: Never Used  Substance Use Topics  . Alcohol use: No     FAMILY HISTORY:   Family History  Problem Relation Age  of Onset  . Bone cancer Sister   . Heart attack Brother   . Breast cancer Sister   . Lung cancer Sister   . Heart attack Daughter      DRUG ALLERGIES:   Allergies  Allergen Reactions  . Propofol Other (See Comments)    MEDICATIONS AT HOME:   Prior to Admission medications   Medication Sig Start Date End Date Taking? Authorizing Provider  Albuterol Sulfate (VENTOLIN HFA IN) Inhale 1 puff into the lungs as needed.     [provider]  aspirin 81 MG tablet Take 81 mg by mouth daily.    [provider]  donepezil (ARICEPT) 10 MG tablet Take 1 tablet (10 mg total) by mouth at bedtime. 11/17/17 02/15/18  Rainey Pines, MD  escitalopram (LEXAPRO) 10 MG tablet Take 1 tablet (10 mg total) by mouth daily. 11/17/17   Rainey Pines, MD  ferrous sulfate (SLOW FE) 160 (50 Fe) MG TBCR SR tablet Take 1 tablet (160 mg total) by mouth daily. 01/06/18   Earlie Server, MD  isosorbide mononitrate (IMDUR) 30 MG 24 hr tablet TAKE 1 TABLET BY MOUTH ONCE DAILY 12/08/17   Birdie Sons, MD  lamoTRIgine (LAMICTAL) 25 MG tablet Take 3 tablets (75 mg total) by mouth daily. 11/17/17   Faheem,  Cinda Quest, MD  levothyroxine (SYNTHROID, LEVOTHROID) 75 MCG tablet TAKE 1 TABLET BY MOUTH ONCE A DAY 07/08/17   Byrnett, Forest Gleason, MD  losartan (COZAAR) 50 MG tablet Take 1 tablet by mouth daily. 12/23/13   [provider]  mesalamine (LIALDA) 1.2 g EC tablet TAKE 2 TABLETS BY MOUTH ONCE A DAY WITH BREAKFAST 02/16/18   Birdie Sons, MD  Multiple Vitamins-Minerals (MULTIVITAMIN WITH MINERALS) tablet Take 1 tablet by mouth daily.    [provider]  nitroGLYCERIN (NITROSTAT) 0.4 MG SL tablet Place under the tongue. 12/17/10   [provider]  pantoprazole (PROTONIX) 40 MG tablet TAKE 1 TABLET BY MOUTH ONCE A DAY 12/01/17   Birdie Sons, MD  simvastatin (ZOCOR) 40 MG tablet Take 1 tablet by mouth daily. 01/11/14   [provider]    REVIEW OF SYSTEMS:  Review of Systems   Constitutional: Negative for chills, fever, malaise/fatigue and weight loss.  HENT: Negative for ear pain, hearing loss and tinnitus.   Eyes: Negative for blurred vision, double vision, pain and redness.  Respiratory: Negative for cough, hemoptysis and shortness of breath.   Cardiovascular: Negative for chest pain, palpitations, orthopnea and leg swelling.  Gastrointestinal: Negative for abdominal pain, constipation, diarrhea, nausea and vomiting.       Hematemesis  Genitourinary: Negative for dysuria, frequency and hematuria.  Musculoskeletal: Negative for back pain, joint pain and neck pain.  Skin:       No acne, rash, or lesions  Neurological: Negative for dizziness, tremors, focal weakness and weakness.  Endo/Heme/Allergies: Negative for polydipsia. Does not bruise/bleed easily.  Psychiatric/Behavioral: Negative for depression. The patient is not nervous/anxious and does not have insomnia.      VITAL SIGNS:   Vitals:   03/06/18 2030 03/06/18 2100 03/06/18 2130 03/06/18 2200  BP: (!) 168/45 (!) 164/51 (!) 134/50 (!) 128/43  Pulse: 64 68 71 76  Resp: 19 11 17 19   Temp:      TempSrc:      SpO2: 94% 92% 97% 91%   Wt Readings from Last 3 Encounters:  01/06/18 60.8 kg (134 lb)  12/15/17 61.2 kg (135 lb)  07/24/17 60.8 kg (134 lb)    PHYSICAL EXAMINATION:  Physical Exam  Vitals reviewed. Constitutional: She is oriented to person, place, and time. She appears well-developed and well-nourished. No distress.  HENT:  Head: Normocephalic and atraumatic.  Mouth/Throat: Oropharynx is clear and moist.  Eyes: Pupils are equal, round, and reactive to light. Conjunctivae and EOM are normal. No scleral icterus.  Neck: Normal range of motion. Neck supple. No JVD present. No thyromegaly present.  Cardiovascular: Normal rate, regular rhythm and intact distal pulses. Exam reveals no gallop and no friction rub.  No murmur heard. Respiratory: Effort normal and breath sounds normal. No  respiratory distress. She has no wheezes. She has no rales.  GI: Soft. Bowel sounds are normal. She exhibits no distension. There is no tenderness.  Musculoskeletal: Normal range of motion. She exhibits no edema.  No arthritis, no gout  Lymphadenopathy:    She has no cervical adenopathy.  Neurological: She is alert and oriented to person, place, and time. No cranial nerve deficit.  No dysarthria, no aphasia  Skin: Skin is warm and dry. No rash noted. No erythema.  Psychiatric: She has a normal mood and affect. Her behavior is normal. Judgment and thought content normal.    LABORATORY PANEL:   CBC Recent Labs  Lab 03/06/18 1906  WBC 6.2  HGB  10.1*  HCT 30.2*  PLT 82*   ------------------------------------------------------------------------------------------------------------------  Chemistries  Recent Labs  Lab 03/06/18 1906  NA 141  K 3.7  CL 111  CO2 23  GLUCOSE 124*  BUN 24*  CREATININE 0.69  CALCIUM 8.1*  AST 30  ALT 10*  ALKPHOS 77  BILITOT 1.4*   ------------------------------------------------------------------------------------------------------------------  Cardiac Enzymes No results for input(s): TROPONINI in the last 168 hours. ------------------------------------------------------------------------------------------------------------------  RADIOLOGY:  No results found.  EKG:   Orders placed or performed during the hospital encounter of 03/06/18  . EKG 12-Lead  . EKG 12-Lead    IMPRESSION AND PLAN:  Principal Problem:   Hematemesis -on suspicion for esophageal or gastric varices in the setting of cirrhosis patient started on octreotide as well as PPI.  Hemoglobin stable, will we will trend serially.  GI consult Active Problems:   Hypertension -stable, continue home meds   CAD in native artery -continue home medications   Cirrhosis ([place]) -avoid hepatotoxins and monitor   HLD (hyperlipidemia) -home dose antilipid   Anxiety disorder -home  dose anxiolytic   GERD (gastroesophageal reflux disease) -PPI as above  Chart review performed and case discussed with ED provider. Labs, imaging and/or ECG reviewed by provider and discussed with patient/family. Management plans discussed with the patient and/or family.  DVT PROPHYLAXIS: Mechanical only  GI PROPHYLAXIS: PPI  ADMISSION STATUS: Inpatient  CODE STATUS: Full  TOTAL TIME TAKING CARE OF THIS PATIENT: 45 minutes.   Benjamin Merrihew [place] 03/06/2018, 10:11 PM  [facility]  Office  [phone]  CC: Primary care physician; Birdie Sons, MD  Note:  This document was prepared using Dragon voice recognition software and may include unintentional dictation errors.

## 2018-03-07 ENCOUNTER — Encounter
Admission: EM | Disposition: A | Discharge status: Home Health | Payer: Self-pay | Source: Home / Self Care | Attending: Internal Medicine

## 2018-03-07 ENCOUNTER — Inpatient Hospital Stay: Payer: Medicare Other | Admitting: Anesthesiology

## 2018-03-07 ENCOUNTER — Other Ambulatory Visit: Payer: Self-pay

## 2018-03-07 HISTORY — PX: ESOPHAGOGASTRODUODENOSCOPY (EGD) WITH PROPOFOL: SHX5813

## 2018-03-07 LAB — CBC
HCT: 25.8 % — ABNORMAL LOW (ref 35.0–47.0)
Hemoglobin: 8.3 g/dL — ABNORMAL LOW (ref 12.0–16.0)
MCH: 30.9 pg (ref 26.0–34.0)
MCHC: 32.1 g/dL (ref 32.0–36.0)
MCV: 96.3 fL (ref 80.0–100.0)
Platelets: 71 10*3/uL — ABNORMAL LOW (ref 150–440)
RBC: 2.68 MIL/uL — ABNORMAL LOW (ref 3.80–5.20)
RDW: 19.4 % — AB (ref 11.5–14.5)
WBC: 3.6 10*3/uL (ref 3.6–11.0)

## 2018-03-07 LAB — RETICULOCYTES
RBC.: 2.41 MIL/uL — AB (ref 3.80–5.20)
Retic Count, Absolute: 60.3 10*3/uL (ref 19.0–183.0)
Retic Ct Pct: 2.5 % (ref 0.4–3.1)

## 2018-03-07 LAB — URINALYSIS, COMPLETE (UACMP) WITH MICROSCOPIC
Bacteria, UA: NONE SEEN
Bilirubin Urine: NEGATIVE
GLUCOSE, UA: NEGATIVE mg/dL
KETONES UR: NEGATIVE mg/dL
Nitrite: NEGATIVE
PH: 5 (ref 5.0–8.0)
PROTEIN: NEGATIVE mg/dL
SQUAMOUS EPITHELIAL / LPF: NONE SEEN
Specific Gravity, Urine: 1.019 (ref 1.005–1.030)

## 2018-03-07 LAB — GLUCOSE, CAPILLARY: Glucose-Capillary: 98 mg/dL (ref 65–99)

## 2018-03-07 LAB — BASIC METABOLIC PANEL
Anion gap: 5 (ref 5–15)
BUN: 22 mg/dL — AB (ref 6–20)
CHLORIDE: 112 mmol/L — AB (ref 101–111)
CO2: 24 mmol/L (ref 22–32)
CREATININE: 0.76 mg/dL (ref 0.44–1.00)
Calcium: 7.8 mg/dL — ABNORMAL LOW (ref 8.9–10.3)
GFR calc Af Amer: >60 mL/min (ref >60)
GFR calc non Af Amer: >60 mL/min (ref >60)
Glucose, Bld: 104 mg/dL — ABNORMAL HIGH (ref 65–99)
Potassium: 4.2 mmol/L (ref 3.5–5.1)
Sodium: 141 mmol/L (ref 135–145)

## 2018-03-07 LAB — IRON AND TIBC
Iron: 39 ug/dL (ref 28–170)
SATURATION RATIOS: 21 % (ref 10.4–31.8)
TIBC: 185 ug/dL — AB (ref 250–450)
UIBC: 146 ug/dL

## 2018-03-07 LAB — VITAMIN B12: Vitamin B-12: 430 pg/mL (ref 180–914)

## 2018-03-07 LAB — MRSA PCR SCREENING: MRSA BY PCR: NEGATIVE

## 2018-03-07 LAB — PROTIME-INR
INR: 1.49
Prothrombin Time: 17.9 seconds — ABNORMAL HIGH (ref 11.4–15.2)

## 2018-03-07 LAB — FOLATE: FOLATE: 20.6 ng/mL (ref >5.9)

## 2018-03-07 LAB — HEMOGLOBIN: Hemoglobin: 8.5 g/dL — ABNORMAL LOW (ref 12.0–16.0)

## 2018-03-07 LAB — FERRITIN: Ferritin: 19 ng/mL (ref 11–307)

## 2018-03-07 SURGERY — ESOPHAGOGASTRODUODENOSCOPY (EGD) WITH PROPOFOL
Anesthesia: General

## 2018-03-07 MED ORDER — SODIUM CHLORIDE 0.9 % IV SOLN
INTRAVENOUS | Status: DC | PRN
  Administered 2018-03-07: 13:00:00 via INTRAVENOUS

## 2018-03-07 MED ORDER — SUCCINYLCHOLINE CHLORIDE 20 MG/ML IJ SOLN
INTRAMUSCULAR | Status: AC
  Filled 2018-03-07: qty 1

## 2018-03-07 MED ORDER — FENTANYL CITRATE (PF) 100 MCG/2ML IJ SOLN
INTRAMUSCULAR | Status: DC | PRN
  Administered 2018-03-07: 50 ug via INTRAVENOUS

## 2018-03-07 MED ORDER — SODIUM CHLORIDE 0.9 % IV SOLN
INTRAVENOUS | Status: AC
  Administered 2018-03-07: 01:00:00 via INTRAVENOUS

## 2018-03-07 MED ORDER — LIDOCAINE HCL (PF) 2 % IJ SOLN
INTRAMUSCULAR | Status: AC
  Filled 2018-03-07: qty 10

## 2018-03-07 MED ORDER — ESCITALOPRAM OXALATE 10 MG PO TABS
10.0000 mg | ORAL_TABLET | Freq: Every day | ORAL | Status: DC
  Administered 2018-03-07 – 2018-03-10 (×4): 10 mg via ORAL
  Filled 2018-03-07 (×4): qty 1

## 2018-03-07 MED ORDER — ONDANSETRON HCL 4 MG PO TABS: 4.0000 mg | ORAL_TABLET | Freq: Four times a day (QID) | ORAL | Status: DC | PRN

## 2018-03-07 MED ORDER — SUCCINYLCHOLINE CHLORIDE 20 MG/ML IJ SOLN
INTRAMUSCULAR | Status: DC | PRN
  Administered 2018-03-07: 60 mg via INTRAVENOUS

## 2018-03-07 MED ORDER — PANTOPRAZOLE SODIUM 40 MG IV SOLR
40.0000 mg | Freq: Two times a day (BID) | INTRAVENOUS | Status: DC
  Administered 2018-03-07 – 2018-03-09 (×5): 40 mg via INTRAVENOUS
  Filled 2018-03-07 (×5): qty 40

## 2018-03-07 MED ORDER — LEVOTHYROXINE SODIUM 50 MCG PO TABS
75.0000 ug | ORAL_TABLET | Freq: Every day | ORAL | Status: DC
  Administered 2018-03-07 – 2018-03-10 (×4): 75 ug via ORAL
  Filled 2018-03-07 (×3): qty 1
  Filled 2018-03-07: qty 2

## 2018-03-07 MED ORDER — SIMVASTATIN 20 MG PO TABS
40.0000 mg | ORAL_TABLET | Freq: Every day | ORAL | Status: DC
  Administered 2018-03-07 – 2018-03-10 (×4): 40 mg via ORAL
  Filled 2018-03-07 (×4): qty 2

## 2018-03-07 MED ORDER — ASPIRIN 81 MG PO CHEW
81.0000 mg | CHEWABLE_TABLET | Freq: Every day | ORAL | Status: DC
  Administered 2018-03-07: 81 mg via ORAL
  Filled 2018-03-07: qty 1

## 2018-03-07 MED ORDER — ORAL CARE MOUTH RINSE
15.0000 mL | Freq: Two times a day (BID) | OROMUCOSAL | Status: DC
  Administered 2018-03-07 – 2018-03-10 (×5): 15 mL via OROMUCOSAL

## 2018-03-07 MED ORDER — SODIUM CHLORIDE 0.9 % IV SOLN
INTRAVENOUS | Status: AC
  Administered 2018-03-07: 50 mL/h via INTRAVENOUS

## 2018-03-07 MED ORDER — ETOMIDATE 2 MG/ML IV SOLN
INTRAVENOUS | Status: DC | PRN
  Administered 2018-03-07: 8 mg via INTRAVENOUS

## 2018-03-07 MED ORDER — EPHEDRINE SULFATE 50 MG/ML IJ SOLN
INTRAMUSCULAR | Status: AC
  Filled 2018-03-07: qty 1

## 2018-03-07 MED ORDER — HYDRALAZINE HCL 20 MG/ML IJ SOLN
10.0000 mg | INTRAMUSCULAR | Status: DC | PRN
  Administered 2018-03-07: 20 mg via INTRAVENOUS
  Filled 2018-03-07: qty 1

## 2018-03-07 MED ORDER — ONDANSETRON HCL 4 MG/2ML IJ SOLN: 4.0000 mg | Freq: Four times a day (QID) | INTRAMUSCULAR | Status: DC | PRN

## 2018-03-07 MED ORDER — LIDOCAINE HCL (CARDIAC) 20 MG/ML IV SOLN
INTRAVENOUS | Status: DC | PRN
  Administered 2018-03-07: 60 mg via INTRATRACHEAL

## 2018-03-07 MED ORDER — ISOSORBIDE MONONITRATE ER 30 MG PO TB24
30.0000 mg | ORAL_TABLET | Freq: Every day | ORAL | Status: DC
  Administered 2018-03-07: 30 mg via ORAL
  Filled 2018-03-07: qty 1

## 2018-03-07 MED ORDER — LOSARTAN POTASSIUM 25 MG PO TABS
50.0000 mg | ORAL_TABLET | Freq: Every day | ORAL | Status: DC
  Administered 2018-03-07: 50 mg via ORAL
  Filled 2018-03-07: qty 1

## 2018-03-07 MED ORDER — LAMOTRIGINE 25 MG PO TABS
75.0000 mg | ORAL_TABLET | Freq: Every day | ORAL | Status: DC
  Administered 2018-03-07 – 2018-03-10 (×4): 75 mg via ORAL
  Filled 2018-03-07 (×4): qty 3

## 2018-03-07 MED ORDER — FENTANYL CITRATE (PF) 100 MCG/2ML IJ SOLN
INTRAMUSCULAR | Status: AC
  Filled 2018-03-07: qty 2

## 2018-03-07 MED ORDER — SODIUM CHLORIDE 0.9 % IV SOLN
1.0000 g | INTRAVENOUS | Status: DC
  Administered 2018-03-07 – 2018-03-10 (×4): 1 g via INTRAVENOUS
  Filled 2018-03-07 (×6): qty 10

## 2018-03-07 MED ORDER — ETOMIDATE 2 MG/ML IV SOLN
INTRAVENOUS | Status: AC
  Filled 2018-03-07: qty 10

## 2018-03-07 NOTE — Anesthesia Postprocedure Evaluation (Signed)
Anesthesia Post Note  Patient: Ros[place] G Feinberg  Procedure(s) Performed: ESOPHAGOGASTRODUODENOSCOPY (EGD) WITH PROPOFOL (N/A )  Patient location during evaluation: PACU Anesthesia Type: General Level of consciousness: awake and alert and oriented Pain management: pain level controlled Vital Signs Assessment: post-procedure vital signs reviewed and stable Respiratory status: spontaneous breathing, nonlabored ventilation and respiratory function stable Cardiovascular status: blood pressure returned to baseline and stable Postop Assessment: no signs of nausea or vomiting Anesthetic complications: no     Last Vitals:  Vitals:   03/07/18 1454 03/07/18 1510  BP: (!) 151/48 (!) 163/51  Pulse: 71 72  Resp: 10 13  Temp: (!) 36.1 C   SpO2: 97% 95%    Last Pain:  Vitals:   03/07/18 1510  TempSrc:   PainSc: 0-No pain                 Lachina Salsberry

## 2018-03-07 NOTE — H&P (View-Only) (Signed)
[facility] GI Inpatient Consult Note   Margaret Romero, M.D.  Reason for Consult: upper GI bleed, anemia history of cirrhosis   Attending Requesting Consult: Dr. Benjie Karvonen  Outpatient Primary Physician: Lelon Huh, MD  History of Present Illness: Margaret Romero is a 72 y.o. femalewith a history of peripheral vascular disease, carotid artery stenosis status post extended ICU hospitalization after carotid endarterectomy around 2017. Patient since that time as had mild dementia and now relies on her caregiver and power of attorney who also happens to be her landlord. Patient appears reasonably ale and denies any significant pain although she has some mild epigastric p Reportedly, the patient was attending a funeral service and driving her car and started vomiting copious amounts of red blood. She nearly passed out. She then was quickly admitted to the hospital and observed and GI was called for CONSULTATION. Patient has never undergone a previous upper endoscopy.  Has a remote history of alcoholism and is in remission for about 20 years.   Past Medical History:  Past Medical History:  Diagnosis Date  . Bipolar affective ([place])   . Cancer [facility])    thyroid cancer   . Closed fracture carpal bone 11/16/2015  . GERD (gastroesophageal reflux disease)   . Head injury without fracture of skull   . Hypertension   . Malignant neoplasm of thyroid gland [facility]) January 23, 2011   medullary carcinoma thyroid T2, Nx.  . Memory changes   . Thyroid cancer [facility])     Problem List: Patient Active Problem List   Diagnosis Date Noted  . Cirrhosis ([place]) 03/06/2018  . Hematemesis 03/06/2018  . Liver nodule 12/01/2017  . Inguinal hernia of right side without obstruction or gangrene 07/25/2017  . Multiple lung nodules on CT 07/14/2017  . GERD (gastroesophageal reflux disease)   . Bilateral carotid artery stenosis 05/20/2017  . Benign meningioma of brain ([place]) 04/10/2017  . Osteopenia 07/12/2016   . Nodular hyperplasia of liver 07/03/2016  . Splenomegaly 06/11/2016  . Thrombocytopenia ([place]) 06/11/2016  . Bipolar affective disorder ([place]) 01/22/2016  . Pancytopenia ([place]) 11/20/2015  . Skin lesion 11/20/2015  . Disease of skin and subcutaneous tissue 11/20/2015  . Body mass index (BMI) of 22.0-22.9 in adult 11/16/2015  . Cancer of thyroid ([place]) 11/16/2015  . Clinical depression 11/16/2015  . 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 Presence of stent in coronary artery 11/16/2015  . HLD (hyperlipidemia) 11/16/2015  . Bad memory 11/16/2015  . 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 CAD in native artery 11/16/2015  . Diverticulitis 11/16/2015  . Major depressive disorder with single episode 11/16/2015  . Gastroenteritis, non-infectious 11/16/2015  . History of cardiovascular surgery 11/16/2015  . Occlusion and stenosis of unspecified carotid artery 11/16/2015  . Anxiety disorder 08/14/2015  . Hypertension 06/13/2015  . Ulcerative colitis ([place]) 06/01/2015  . Fecal occult blood test positive 12/15/2014  . SOB (shortness of breath) on exertion 11/01/2014  . Hypoxemia 11/01/2014  . Personal history of malignant neoplasm of thyroid 01/13/2014  . IBS (irritable bowel syndrome) 12/02/1998    Past Surgical History: Past Surgical History:  Procedure Laterality Date  . CAROTID ENDARTERECTOMY Right November 2015   [name], MD  . [name]  2006  . COLONOSCOPY  2008  . CORONARY STENT PLACEMENT  2007  . HERNIA REPAIR  2007  . NASAL SINUS SURGERY  2008  . SKIN CANCER EXCISION    . THYROIDECTOMY  2012    Allergies: Allergies  Allergen Reactions  . Propofol Other (See Comments)    Reaction: unknown suspect allergy    Home Medications: Medications Prior to Admission  Medication Sig Dispense Refill Last Dose  . albuterol (PROVENTIL HFA;VENTOLIN HFA) 108 (90 Base) MCG/ACT inhaler Inhale 2 puffs into the lungs every 6 (six) hours as needed for wheezing  or shortness of breath.   prn at prn  . aspirin 81 MG tablet Take 81 mg by mouth daily.   03/06/2018 at Unknown time  . donepezil (ARICEPT) 10 MG tablet Take 10 mg by mouth at bedtime.   03/05/2018 at Unknown time  . escitalopram (LEXAPRO) 10 MG tablet Take 1 tablet (10 mg total) by mouth daily. 90 tablet 4 03/06/2018 at Unknown time  . ferrous sulfate 325 (65 FE) MG tablet Take 325 mg by mouth every evening.   03/05/2018 at Unknown time  . isosorbide mononitrate (IMDUR) 30 MG 24 hr tablet TAKE 1 TABLET BY MOUTH ONCE DAILY 90 tablet 4 03/06/2018 at Unknown time  . lamoTRIgine (LAMICTAL) 25 MG tablet Take 3 tablets (75 mg total) by mouth daily. 270 tablet 2 03/06/2018 at Unknown time  . levothyroxine (SYNTHROID, LEVOTHROID) 75 MCG tablet TAKE 1 TABLET BY MOUTH ONCE A DAY 90 tablet 3 03/06/2018 at Unknown time  . losartan (COZAAR) 50 MG tablet Take 1 tablet by mouth daily.   03/06/2018 at Unknown time  . mesalamine (LIALDA) 1.2 g EC tablet TAKE 2 TABLETS BY MOUTH ONCE A DAY WITH BREAKFAST 60 tablet 5 03/06/2018 at Unknown time  . Multiple Vitamins-Minerals (MULTIVITAMIN WITH MINERALS) tablet Take 1 tablet by mouth daily.   03/06/2018 at Unknown time  . nitroGLYCERIN (NITROSTAT) 0.4 MG SL tablet Place 0.4 mg under the tongue every 5 (five) minutes as needed for chest pain.    prn at prn  . pantoprazole (PROTONIX) 40 MG tablet TAKE 1 TABLET BY MOUTH ONCE A DAY 90 tablet 3 03/06/2018 at Unknown time  . simvastatin (ZOCOR) 40 MG tablet Take 1 tablet by mouth daily.   03/06/2018 at Unknown time   Home medication reconciliation was completed with the patient.   Scheduled Inpatient Medications:   . escitalopram  10 mg Oral Daily  . isosorbide mononitrate  30 mg Oral Daily  . lamoTRIgine  75 mg Oral Daily  . levothyroxine  75 mcg Oral QAC breakfast  . losartan  50 mg Oral Daily  . pantoprazole (PROTONIX) IV  40 mg Intravenous Q12H  . simvastatin  40 mg Oral Daily    Continuous Inpatient Infusions:   . cefTRIAXone  (ROCEPHIN)  IV Stopped (03/07/18 0957)  . octreotide  (SANDOSTATIN)    IV infusion 50 mcg/hr (03/07/18 0844)    PRN Inpatient Medications:  ondansetron **OR** ondansetron (ZOFRAN) IV  Family History: family history includes Bone cancer in her sister; Breast cancer in her sister; Heart attack in her brother and daughter; Lung cancer in her sister.   GI Family History: Negative.  Social History:   reports that she quit smoking about 43 years ago. She has a 10.00 pack-year smoking history. She has never used smokeless tobacco. She reports that she does not drink alcohol or use drugs. The patient denies ETOH, tobacco, or drug use.    Review of Systems: Review of Systems - History obtained from landlord who is POA and friend  Physical Examination: BP (!) 152/47 (BP Location: Left Arm)   Pulse 79   Temp 98.6 F (37 C) (Oral)   Resp 18   Ht  5' 2"  (1.575 m)   Wt 60.5 kg (133 lb 4.8 oz)   SpO2 93%   BMI 24.38 kg/m  Physical Exam  Constitutional: She appears well-developed.  HENT:  Head: Normocephalic.  Eyes: Pupils are equal, round, and reactive to light.  Neck: Normal range of motion.  Cardiovascular:  No murmur heard. Pulmonary/Chest: Effort normal. No respiratory distress.  Abdominal: Soft. Bowel sounds are normal. She exhibits no distension.  Musculoskeletal: She exhibits no deformity.  Neurological: She is alert.  Skin: Skin is warm and dry.    Data: Lab Results  Component Value Date   WBC 3.6 03/07/2018   HGB 8.3 (L) 03/07/2018   HCT 25.8 (L) 03/07/2018   MCV 96.3 03/07/2018   PLT 71 (L) 03/07/2018   Recent Labs  Lab 03/06/18 1906 03/07/18 0155 03/07/18 0700  HGB 10.1* 8.5* 8.3*   Lab Results  Component Value Date   NA 141 03/07/2018   K 4.2 03/07/2018   CL 112 (H) 03/07/2018   CO2 24 03/07/2018   BUN 22 (H) 03/07/2018   CREATININE 0.76 03/07/2018   GLU 99 07/22/2014   Lab Results  Component Value Date   ALT 10 (L) 03/06/2018   AST 30 03/06/2018    GGT 221 (H) 08/21/2016   ALKPHOS 77 03/06/2018   BILITOT 1.4 (H) 03/06/2018   Recent Labs  Lab 03/07/18 1156  INR 1.49   CBC Latest Ref Rng & Units 03/07/2018 03/07/2018 03/06/2018  WBC 3.6 - 11.0 K/uL 3.6 - 6.2  Hemoglobin 12.0 - 16.0 g/dL 8.3(L) 8.5(L) 10.1(L)  Hematocrit 35.0 - 47.0 % 25.8(L) - 30.2(L)  Platelets 150 - 440 K/uL 71(L) - 82(L)    STUDIES: Dg Chest Portable 1 View  Result Date: 03/06/2018 CLINICAL DATA:  72 y/o F; episode of hematemesis. History of liver cirrhosis. EXAM: PORTABLE CHEST 1 VIEW COMPARISON:  11/28/2017 chest CT. 10/15/2014 chest radiograph. 12/12/2017 PET-CT. FINDINGS: Stable cardiac silhouette given projection and technique. Calcific aortic atherosclerosis. Ill-defined opacity in the right lung base. Scattered pulmonary nodules better characterized on prior chest CT. No pleural effusion or pneumothorax. Bones are unremarkable. IMPRESSION: Ill-defined opacity in right lung base may represent atelectasis, pneumonia, or possibly aspiration given recent hematemesis. Electronically Signed   By: Kristine Garbe M.D.   On: 03/06/2018 22:20   @IMAGES @  Assessment:UGI bleed. DDx includes PUD, gastritis, Esophageal or gastric variceal bleeding, mallory weiss tear.    Recommendations:EGD. The patient's guardian and POA , Mrs Lynnette Caffey, understands the nature of the planned procedure, indications, risks, alternatives and potential complications including but not limited to bleeding, infection, perforation, damage to internal organs and possible oversedation/side effects from anesthesia. The patient agrees and gives consent to proceed.  Please refer to procedure notes for findings, recommendations and patient disposition/instructions.  Thank you for the consult. Please call with questions or concerns.  Olean Ree, "Lanny Hurst MD [facility] [place], [place] [address] [phone]  03/07/2018 12:23 PM

## 2018-03-07 NOTE — Consult Note (Signed)
Pharmacy Antibiotic Note  Margaret Romero is a 72 y.o. female admitted on 03/06/2018 with UTI.  Pharmacy has been consulted for ceftriaxone dosing.  Plan: Start ceftriaxone 1g IV every 24 hours.   Height: 5' 2"  (157.5 cm) Weight: 133 lb 4.8 oz (60.5 kg) IBW/kg (Calculated) : 50.1  Temp (24hrs), Avg:98.1 F (36.7 C), Min:97.7 F (36.5 C), Max:98.6 F (37 C)  Recent Labs  Lab 03/06/18 1906  WBC 6.2  CREATININE 0.69    Estimated Creatinine Clearance: 53.7 mL/min (by C-G formula based on SCr of 0.69 mg/dL).    Allergies  Allergen Reactions  . Propofol Other (See Comments)    Reaction: unknown suspect allergy    Antimicrobials this admission: 4/6 ceftriaxone >>   Dose adjustments this admission:  Microbiology results: 4/6 [id number]   Thank you for allowing pharmacy to be a part of this patient's care.  Pernell Dupre, PharmD, BCPS Clinical Pharmacist 03/07/2018 7:51 AM

## 2018-03-07 NOTE — Anesthesia Post-op Follow-up Note (Signed)
Anesthesia QCDR form completed.        

## 2018-03-07 NOTE — Transfer of Care (Signed)
Immediate Anesthesia Transfer of Care Note  Patient: Margaret Romero  Procedure(s) Performed: ESOPHAGOGASTRODUODENOSCOPY (EGD) WITH PROPOFOL (N/A )  Patient Location: PACU  Anesthesia Type:General  Level of Consciousness: awake, alert  and patient cooperative  Airway & Oxygen Therapy: Patient Spontanous Breathing and Patient connected to nasal cannula oxygen  Post-op Assessment: Report given to RN, Post -op Vital signs reviewed and stable and Patient moving all extremities X 4  Post vital signs: stable  Last Vitals:  Vitals Value Taken Time  BP 156/49 03/07/2018  2:23 PM  Temp 36.1 C 03/07/2018  2:22 PM  Pulse 74 03/07/2018  2:25 PM  Resp 28 03/07/2018  2:25 PM  SpO2 94 % 03/07/2018  2:25 PM  Vitals shown include unvalidated device data.  Last Pain:  Vitals:   03/07/18 1422  TempSrc:   PainSc: 0-No pain         Complications: No apparent anesthesia complications

## 2018-03-07 NOTE — Interval H&P Note (Signed)
History and Physical Interval Note:  03/07/2018 1:07 PM  Margaret Romero  has presented today for surgery, with the diagnosis of hemetaemesis  The various methods of treatment have been discussed with the patient and family. After consideration of risks, benefits and other options for treatment, the patient has consented to  Procedure(s): ESOPHAGOGASTRODUODENOSCOPY (EGD) WITH PROPOFOL (N/A) as a surgical intervention .  The patient's history has been reviewed, patient examined, no change in status, stable for surgery.  I have reviewed the patient's chart and labs.  Questions were answered to the patient's satisfaction.     [place], [place]

## 2018-03-07 NOTE — Progress Notes (Signed)
Family Meeting Note  Advance Directive:no  Today a meeting took place with the Patient.and POA   The following clinical team members were present during this meeting:MD  The following were discussed:Patient's diagnosis: GIB , Patient's progosis: > 12 months and Goals for treatment: Full Code  Additional follow-up to be provided: FULL CODE no changes to advanced diectives  Time spent during discussion:16 minutes  Oyuki Hogan, MD

## 2018-03-07 NOTE — Progress Notes (Signed)
[place] [facility] [place] NAME: Margaret Romero    MR#:  [id number]  DATE OF BIRTH:  07-26-1946  SUBJECTIVE:   Patient without hematemesis this morning.  Denies abdominal pain  REVIEW OF SYSTEMS:    Review of Systems  Constitutional: Negative for fever, chills weight loss HENT: Negative for ear pain, nosebleeds, congestion, facial swelling, rhinorrhea, neck pain, neck stiffness and ear discharge.   Respiratory: Negative for cough, shortness of breath, wheezing  Cardiovascular: Negative for chest pain, palpitations and leg swelling.  Gastrointestinal: Negative for heartburn, abdominal pain, vomiting, diarrhea or consitpation Genitourinary: Negative for dysuria, urgency, frequency, hematuria Musculoskeletal: Negative for back pain or joint pain Neurological: Negative for dizziness, seizures, syncope, focal weakness,  numbness and headaches.  Hematological: Does not bruise/bleed easily.  Psychiatric/Behavioral: Negative for hallucinations, confusion, dysphoric mood    Tolerating Diet: N.p.o.      DRUG ALLERGIES:   Allergies  Allergen Reactions  . Propofol Other (See Comments)    Reaction: unknown suspect allergy    VITALS:  Blood pressure (!) 152/47, pulse 79, temperature 98.6 F (37 C), temperature source Oral, resp. rate 18, height 5' 2"  (1.575 m), weight 60.5 kg (133 lb 4.8 oz), SpO2 93 %.  PHYSICAL EXAMINATION:  Constitutional: Appears well-developed and well-nourished. No distress. HENT: Normocephalic. Marland Kitchen Oropharynx is clear and moist.  Eyes: Conjunctivae and EOM are normal. PERRLA, no scleral icterus.  Neck: Normal ROM. Neck supple. No JVD. No tracheal deviation. CVS: RRR, S1/S2 +, no murmurs, no gallops, no carotid bruit.  Pulmonary: Effort and breath sounds normal, no stridor, rhonchi, wheezes, rales.  Abdominal: Soft. BS +,  no distension, tenderness, rebound or guarding.  Musculoskeletal: Normal range of motion. No edema and  no tenderness.  Neuro: Alert. CN 2-12 grossly intact. No focal deficits. Skin: Skin is warm and dry. No rash noted. Psychiatric: Normal mood and affect.      LABORATORY PANEL:   CBC Recent Labs  Lab 03/07/18 0700  WBC 3.6  HGB 8.3*  HCT 25.8*  PLT 71*   ------------------------------------------------------------------------------------------------------------------  Chemistries  Recent Labs  Lab 03/06/18 1906 03/07/18 0700  NA 141 141  K 3.7 4.2  CL 111 112*  CO2 23 24  GLUCOSE 124* 104*  BUN 24* 22*  CREATININE 0.69 0.76  CALCIUM 8.1* 7.8*  AST 30  --   ALT 10*  --   ALKPHOS 77  --   BILITOT 1.4*  --    ------------------------------------------------------------------------------------------------------------------  Cardiac Enzymes No results for input(s): TROPONINI in the last 168 hours. ------------------------------------------------------------------------------------------------------------------  RADIOLOGY:  Dg Chest Portable 1 View  Result Date: 03/06/2018 CLINICAL DATA:  72 y/o F; episode of hematemesis. History of liver cirrhosis. EXAM: PORTABLE CHEST 1 VIEW COMPARISON:  11/28/2017 chest CT. 10/15/2014 chest radiograph. 12/12/2017 PET-CT. FINDINGS: Stable cardiac silhouette given projection and technique. Calcific aortic atherosclerosis. Ill-defined opacity in the right lung base. Scattered pulmonary nodules better characterized on prior chest CT. No pleural effusion or pneumothorax. Bones are unremarkable. IMPRESSION: Ill-defined opacity in right lung base may represent atelectasis, pneumonia, or possibly aspiration given recent hematemesis. Electronically Signed   By: Kristine Garbe M.D.   On: 03/06/2018 22:20     ASSESSMENT AND PLAN:   72 year old female with a remote history of thyroid cancer who presents with hematemesis.  1.  Hematemesis with history of liver cirrhosis and acute on chronic anemia: Patient is plan for EGD today.  Case  discussed with GI. Continue PPI and octreotide  Hemoglobin 8.3.  No indication for blood transfusion Recheck iron panel IV iron if indicated 2.  UTI: Start Rocephin and follow-up on urine culture  3.  Bipolar: Continue Lamictal, Lexapro  4.  Hyperlipidemia: Continue Zocor  5.  Hypothyroidism: Continue Synthroid  6.  Essential hypertension: Continue losartan      Management plans discussed with the patient and POA and they are in agreement.  CODE STATUS: FULL  TOTAL TIME TAKING CARE OF THIS PATIENT: 30 minutes.     POSSIBLE D/C 1-2 days, DEPENDING ON CLINICAL CONDITION.   Hondo Nanda M.D on 03/07/2018 at 11:16 AM  Between 7am to 6pm - Pager - [phone] After 6pm go to [email or web address] - password [email or web address] [facility]  Office  [phone]  CC: Primary care physician; Birdie Sons, MD  Note: This dictation was prepared with Dragon dictation along with smaller phrase technology. Any transcriptional errors that result from this process are unintentional.

## 2018-03-07 NOTE — Consult Note (Signed)
[facility] GI Inpatient Consult Note   Kathline Magic, M.D.  Reason for Consult: upper GI bleed, anemia history of cirrhosis   Attending Requesting Consult: Dr. Benjie Karvonen  Outpatient Primary Physician: Lelon Huh, MD  History of Present Illness: Margaret Romero is a 72 y.o. femalewith a history of peripheral vascular disease, carotid artery stenosis status post extended ICU hospitalization after carotid endarterectomy around 2017. Patient since that time as had mild dementia and now relies on her caregiver and power of attorney who also happens to be her landlord. Patient appears reasonably ale and denies any significant pain although she has some mild epigastric p Reportedly, the patient was attending a funeral service and driving her car and started vomiting copious amounts of red blood. She nearly passed out. She then was quickly admitted to the hospital and observed and GI was called for CONSULTATION. Patient has never undergone a previous upper endoscopy.  Has a remote history of alcoholism and is in remission for about 20 years.   Past Medical History:  Past Medical History:  Diagnosis Date  . Bipolar affective ([place])   . Cancer [facility])    thyroid cancer   . Closed fracture carpal bone 11/16/2015  . GERD (gastroesophageal reflux disease)   . Head injury without fracture of skull   . Hypertension   . Malignant neoplasm of thyroid gland [facility]) January 23, 2011   medullary carcinoma thyroid T2, Nx.  . Memory changes   . Thyroid cancer [facility])     Problem List: Patient Active Problem List   Diagnosis Date Noted  . Cirrhosis ([place]) 03/06/2018  . Hematemesis 03/06/2018  . Liver nodule 12/01/2017  . Inguinal hernia of right side without obstruction or gangrene 07/25/2017  . Multiple lung nodules on CT 07/14/2017  . GERD (gastroesophageal reflux disease)   . Bilateral carotid artery stenosis 05/20/2017  . Benign meningioma of brain ([place]) 04/10/2017  . Osteopenia 07/12/2016   . Nodular hyperplasia of liver 07/03/2016  . Splenomegaly 06/11/2016  . Thrombocytopenia ([place]) 06/11/2016  . Bipolar affective disorder ([place]) 01/22/2016  . Pancytopenia ([place]) 11/20/2015  . Skin lesion 11/20/2015  . Disease of skin and subcutaneous tissue 11/20/2015  . Body mass index (BMI) of 22.0-22.9 in adult 11/16/2015  . Cancer of thyroid ([place]) 11/16/2015  . Clinical depression 11/16/2015  . 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 Presence of stent in coronary artery 11/16/2015  . HLD (hyperlipidemia) 11/16/2015  . Bad memory 11/16/2015  . 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 CAD in native artery 11/16/2015  . Diverticulitis 11/16/2015  . Major depressive disorder with single episode 11/16/2015  . Gastroenteritis, non-infectious 11/16/2015  . History of cardiovascular surgery 11/16/2015  . Occlusion and stenosis of unspecified carotid artery 11/16/2015  . Anxiety disorder 08/14/2015  . Hypertension 06/13/2015  . Ulcerative colitis ([place]) 06/01/2015  . Fecal occult blood test positive 12/15/2014  . SOB (shortness of breath) on exertion 11/01/2014  . Hypoxemia 11/01/2014  . Personal history of malignant neoplasm of thyroid 01/13/2014  . IBS (irritable bowel syndrome) 12/02/1998    Past Surgical History: Past Surgical History:  Procedure Laterality Date  . CAROTID ENDARTERECTOMY Right November 2015   [name], MD  . [name]  2006  . COLONOSCOPY  2008  . CORONARY STENT PLACEMENT  2007  . HERNIA REPAIR  2007  . NASAL SINUS SURGERY  2008  . SKIN CANCER EXCISION    . THYROIDECTOMY  2012    Allergies: Allergies  Allergen Reactions  . Propofol Other (See Comments)    Reaction: unknown suspect allergy    Home Medications: Medications Prior to Admission  Medication Sig Dispense Refill Last Dose  . albuterol (PROVENTIL HFA;VENTOLIN HFA) 108 (90 Base) MCG/ACT inhaler Inhale 2 puffs into the lungs every 6 (six) hours as needed for wheezing  or shortness of breath.   prn at prn  . aspirin 81 MG tablet Take 81 mg by mouth daily.   03/06/2018 at Unknown time  . donepezil (ARICEPT) 10 MG tablet Take 10 mg by mouth at bedtime.   03/05/2018 at Unknown time  . escitalopram (LEXAPRO) 10 MG tablet Take 1 tablet (10 mg total) by mouth daily. 90 tablet 4 03/06/2018 at Unknown time  . ferrous sulfate 325 (65 FE) MG tablet Take 325 mg by mouth every evening.   03/05/2018 at Unknown time  . isosorbide mononitrate (IMDUR) 30 MG 24 hr tablet TAKE 1 TABLET BY MOUTH ONCE DAILY 90 tablet 4 03/06/2018 at Unknown time  . lamoTRIgine (LAMICTAL) 25 MG tablet Take 3 tablets (75 mg total) by mouth daily. 270 tablet 2 03/06/2018 at Unknown time  . levothyroxine (SYNTHROID, LEVOTHROID) 75 MCG tablet TAKE 1 TABLET BY MOUTH ONCE A DAY 90 tablet 3 03/06/2018 at Unknown time  . losartan (COZAAR) 50 MG tablet Take 1 tablet by mouth daily.   03/06/2018 at Unknown time  . mesalamine (LIALDA) 1.2 g EC tablet TAKE 2 TABLETS BY MOUTH ONCE A DAY WITH BREAKFAST 60 tablet 5 03/06/2018 at Unknown time  . Multiple Vitamins-Minerals (MULTIVITAMIN WITH MINERALS) tablet Take 1 tablet by mouth daily.   03/06/2018 at Unknown time  . nitroGLYCERIN (NITROSTAT) 0.4 MG SL tablet Place 0.4 mg under the tongue every 5 (five) minutes as needed for chest pain.    prn at prn  . pantoprazole (PROTONIX) 40 MG tablet TAKE 1 TABLET BY MOUTH ONCE A DAY 90 tablet 3 03/06/2018 at Unknown time  . simvastatin (ZOCOR) 40 MG tablet Take 1 tablet by mouth daily.   03/06/2018 at Unknown time   Home medication reconciliation was completed with the patient.   Scheduled Inpatient Medications:   . escitalopram  10 mg Oral Daily  . isosorbide mononitrate  30 mg Oral Daily  . lamoTRIgine  75 mg Oral Daily  . levothyroxine  75 mcg Oral QAC breakfast  . losartan  50 mg Oral Daily  . pantoprazole (PROTONIX) IV  40 mg Intravenous Q12H  . simvastatin  40 mg Oral Daily    Continuous Inpatient Infusions:   . cefTRIAXone  (ROCEPHIN)  IV Stopped (03/07/18 0957)  . octreotide  (SANDOSTATIN)    IV infusion 50 mcg/hr (03/07/18 0844)    PRN Inpatient Medications:  ondansetron **OR** ondansetron (ZOFRAN) IV  Family History: family history includes Bone cancer in her sister; Breast cancer in her sister; Heart attack in her brother and daughter; Lung cancer in her sister.   GI Family History: Negative.  Social History:   reports that she quit smoking about 43 years ago. She has a 10.00 pack-year smoking history. She has never used smokeless tobacco. She reports that she does not drink alcohol or use drugs. The patient denies ETOH, tobacco, or drug use.    Review of Systems: Review of Systems - History obtained from landlord who is POA and friend  Physical Examination: BP (!) 152/47 (BP Location: Left Arm)   Pulse 79   Temp 98.6 F (37 C) (Oral)   Resp 18   Ht  5' 2"  (1.575 m)   Wt 60.5 kg (133 lb 4.8 oz)   SpO2 93%   BMI 24.38 kg/m  Physical Exam  Constitutional: She appears well-developed.  HENT:  Head: Normocephalic.  Eyes: Pupils are equal, round, and reactive to light.  Neck: Normal range of motion.  Cardiovascular:  No murmur heard. Pulmonary/Chest: Effort normal. No respiratory distress.  Abdominal: Soft. Bowel sounds are normal. She exhibits no distension.  Musculoskeletal: She exhibits no deformity.  Neurological: She is alert.  Skin: Skin is warm and dry.    Data: Lab Results  Component Value Date   WBC 3.6 03/07/2018   HGB 8.3 (L) 03/07/2018   HCT 25.8 (L) 03/07/2018   MCV 96.3 03/07/2018   PLT 71 (L) 03/07/2018   Recent Labs  Lab 03/06/18 1906 03/07/18 0155 03/07/18 0700  HGB 10.1* 8.5* 8.3*   Lab Results  Component Value Date   NA 141 03/07/2018   K 4.2 03/07/2018   CL 112 (H) 03/07/2018   CO2 24 03/07/2018   BUN 22 (H) 03/07/2018   CREATININE 0.76 03/07/2018   GLU 99 07/22/2014   Lab Results  Component Value Date   ALT 10 (L) 03/06/2018   AST 30 03/06/2018    GGT 221 (H) 08/21/2016   ALKPHOS 77 03/06/2018   BILITOT 1.4 (H) 03/06/2018   Recent Labs  Lab 03/07/18 1156  INR 1.49   CBC Latest Ref Rng & Units 03/07/2018 03/07/2018 03/06/2018  WBC 3.6 - 11.0 K/uL 3.6 - 6.2  Hemoglobin 12.0 - 16.0 g/dL 8.3(L) 8.5(L) 10.1(L)  Hematocrit 35.0 - 47.0 % 25.8(L) - 30.2(L)  Platelets 150 - 440 K/uL 71(L) - 82(L)    STUDIES: Dg Chest Portable 1 View  Result Date: 03/06/2018 CLINICAL DATA:  72 y/o F; episode of hematemesis. History of liver cirrhosis. EXAM: PORTABLE CHEST 1 VIEW COMPARISON:  11/28/2017 chest CT. 10/15/2014 chest radiograph. 12/12/2017 PET-CT. FINDINGS: Stable cardiac silhouette given projection and technique. Calcific aortic atherosclerosis. Ill-defined opacity in the right lung base. Scattered pulmonary nodules better characterized on prior chest CT. No pleural effusion or pneumothorax. Bones are unremarkable. IMPRESSION: Ill-defined opacity in right lung base may represent atelectasis, pneumonia, or possibly aspiration given recent hematemesis. Electronically Signed   By: Kristine Garbe M.D.   On: 03/06/2018 22:20   @IMAGES @  Assessment:UGI bleed. DDx includes PUD, gastritis, Esophageal or gastric variceal bleeding, mallory weiss tear.    Recommendations:EGD. The patient's guardian and POA , Margaret Romero, understands the nature of the planned procedure, indications, risks, alternatives and potential complications including but not limited to bleeding, infection, perforation, damage to internal organs and possible oversedation/side effects from anesthesia. The patient agrees and gives consent to proceed.  Please refer to procedure notes for findings, recommendations and patient disposition/instructions.  Thank you for the consult. Please call with questions or concerns.  Olean Ree, "Lanny Hurst MD [facility] [place], [address] [phone]  03/07/2018 12:23 PM

## 2018-03-07 NOTE — Anesthesia Preprocedure Evaluation (Signed)
Anesthesia Evaluation  Patient identified by MRN, date of birth, ID band Patient awake    Reviewed: Allergy & Precautions, NPO status , Patient's Chart, lab work & pertinent test results  History of Anesthesia Complications Negative for: history of anesthetic complications  Airway Mallampati: II  TM Distance: >3 FB Neck ROM: Full    Dental  (+) Upper Dentures, Edentulous Lower   Pulmonary neg sleep apnea, neg COPD, former smoker,    breath sounds clear to auscultation- rhonchi (-) wheezing      Cardiovascular hypertension, Pt. on medications + CAD, + Cardiac Stents (2 stents in 2006) and + Peripheral Vascular Disease   Rhythm:Regular Rate:Normal - Systolic murmurs and - Diastolic murmurs    Neuro/Psych PSYCHIATRIC DISORDERS Anxiety Depression Bipolar Disorder Dementia negative neurological ROS     GI/Hepatic PUD, GERD  ,(+) Cirrhosis       , GIB, hemoptysis     Endo/Other  negative endocrine ROSneg diabetes  Renal/GU negative Renal ROS     Musculoskeletal negative musculoskeletal ROS (+)   Abdominal (+) - obese,   Peds  Hematology  (+) anemia ,   Anesthesia Other Findings Past Medical History: No date: Bipolar affective ([place]) No date: Cancer ([place])     Comment:  thyroid cancer  11/16/2015: Closed fracture carpal bone No date: GERD (gastroesophageal reflux disease) No date: Head injury without fracture of skull No date: Hypertension January 23, 2011: Malignant neoplasm of thyroid gland ([facility])     Comment:  medullary carcinoma thyroid T2, Nx. No date: Memory changes No date: Thyroid cancer ([place])   Reproductive/Obstetrics                            Anesthesia Physical Anesthesia Plan  ASA: III  Anesthesia Plan: General   Post-op Pain Management:    Induction: Intravenous, Rapid sequence and Cricoid pressure planned  PONV Risk Score and Plan: 1 and Ondansetron  Airway  Management Planned: Oral ETT  Additional Equipment:   Intra-op Plan:   Post-operative Plan:   Informed Consent: I have reviewed the patients History and Physical, chart, labs and discussed the procedure including the risks, benefits and alternatives for the proposed anesthesia with the patient or authorized representative who has indicated his/her understanding and acceptance.   Dental advisory given  Plan Discussed with: CRNA and Anesthesiologist  Anesthesia Plan Comments:         Anesthesia Quick Evaluation

## 2018-03-07 NOTE — Progress Notes (Signed)
Family reported that PACU "told us she was going to ICU" following endoscopy. Dr. Alice Reichert was called by this writer and he recommends ICU due to blood loss, approves a clear liquid diet and to continue IV fluids at 50 cc/hr for now, but to call Dr. Benjie Karvonen about rather patient should be transferred to ICU. Dr. Benjie Karvonen called by this writer, states that she spoke with MD in ICU and to place order to transfer. Family notified and upset about communication process, questions answered at bedside with family. Awaiting on ICU bed to become available. Patient placed on tele for monitoring while on 1 c.

## 2018-03-07 NOTE — Op Note (Signed)
[facility] Gastroenterology Patient Name: Margaret Romero Procedure Date: 03/07/2018 1:21 PM MRN: [id number] Account #: [id number] Date of Birth: 1946/05/03 Admit Type: Inpatient Age: 72 Room: [facility] ENDO ROOM 4 Gender: Female Note Status: Finalized Procedure:            Upper GI endoscopy Indications:          Recent gastrointestinal bleeding, Suspected upper                        gastrointestinal bleeding, Suspected esophageal varices                        in patient with suspected portal hypertension Providers:            Benay Pike. Alice Reichert MD, MD Referring MD:         Kirstie Peri. [name], MD (Referring MD) Medicines:            Propofol per Anesthesia Complications:        No immediate complications. Procedure:            Pre-Anesthesia Assessment:                       - The risks and benefits of the procedure and the                        sedation options and risks were discussed with the                        patient. All questions were answered and informed                        consent was obtained.                       - Patient identification and proposed procedure were                        verified prior to the procedure by the nurse. The                        procedure was verified in the procedure room.                       - ASA Grade Assessment: III - A patient with severe                        systemic disease.                       - After reviewing the risks and benefits, the patient                        was deemed in satisfactory condition to undergo the                        procedure.                       After obtaining informed consent, the endoscope was  passed under direct vision. Throughout the procedure,                        the patient's blood pressure, pulse, and oxygen                        saturations were monitored continuously. The Endoscope                        was introduced through the mouth,  and advanced to the                        third part of duodenum. The upper GI endoscopy was                        accomplished without difficulty. The patient tolerated                        the procedure well. Findings:      Three columns of non-bleeding grade III varices were found in the middle       third of the esophagus and in the lower third of the esophagus,. They       were 15 mm in largest diameter. Stigmata of recent bleeding were evident       and red wale signs were present. Three bands were successfully placed.       There was no bleeding during, and at the end, of the procedure.      Type 1 gastroesophageal varices (GOV1, esophageal varices which extend       along the lesser curvature) with no bleeding were found in the cardia.       There were no stigmata of recent bleeding. They were 5 mm in largest       diameter.      Red blood was found in the gastric fundus.      Patient was placed in semiprone position while intubated on mechanical       ventilation. This helped improve visualization by moving the blood clots       in the funds to expose underlying mucosa. No other mucosal lesions were       identified.      The examined duodenum was normal. Impression:           - Recently bleeding grade III esophageal varices.                        Banded.                       - Type 1 gastroesophageal varices (GOV1, esophageal                        varices which extend along the lesser curvature),                        without bleeding.                       - Red blood in the gastric fundus.                       - Normal examined duodenum.                       -  No specimens collected. Recommendation:       - Return patient to ICU until patient is stable.                       - The findings and recommendations were discussed with                        the patient's family. Procedure Code(s):    --- Professional ---                       [phone],  Esophagogastroduodenoscopy, flexible, transoral;                        with band ligation of esophageal/gastric varices Diagnosis Code(s):    --- Professional ---                       I85.01, Esophageal varices with bleeding                       I86.4, Gastric varices                       K92.2, Gastrointestinal hemorrhage, unspecified CPT copyright 2017 American Medical Association. All rights reserved. The codes documented in this report are preliminary and upon coder review may  be revised to meet current compliance requirements. Efrain Sella MD, MD 03/07/2018 2:08:07 PM This report has been signed electronically. Number of Addenda: 0 Note Initiated On: 03/07/2018 1:21 PM      [facility]

## 2018-03-07 NOTE — Progress Notes (Signed)
GI had not yet spoke with family at time that Endo wanted to take patient down for endoscopy, Endoscopy nurse acknowledged, Dr. Alice Reichert from GI came to talk to patient as transportation arrived to take patient and was notified that no order for consent or attestation had been placed in [facility], Dr. Alice Reichert acknowledged and said consent would be gathered once arriving to Endo.

## 2018-03-08 ENCOUNTER — Encounter: Payer: Self-pay | Admitting: Adult Health

## 2018-03-08 DIAGNOSIS — K920 Hematemesis: Secondary | ICD-10-CM

## 2018-03-08 LAB — BASIC METABOLIC PANEL
Anion gap: 4 — ABNORMAL LOW (ref 5–15)
BUN: 16 mg/dL (ref 6–20)
CHLORIDE: 114 mmol/L — AB (ref 101–111)
CO2: 23 mmol/L (ref 22–32)
CREATININE: 0.62 mg/dL (ref 0.44–1.00)
Calcium: 7.6 mg/dL — ABNORMAL LOW (ref 8.9–10.3)
GFR calc non Af Amer: >60 mL/min (ref >60)
Glucose, Bld: 113 mg/dL — ABNORMAL HIGH (ref 65–99)
POTASSIUM: 4.1 mmol/L (ref 3.5–5.1)
Sodium: 141 mmol/L (ref 135–145)

## 2018-03-08 LAB — CBC
HCT: 26.9 % — ABNORMAL LOW (ref 35.0–47.0)
Hemoglobin: 8.7 g/dL — ABNORMAL LOW (ref 12.0–16.0)
MCH: 30.9 pg (ref 26.0–34.0)
MCHC: 32.2 g/dL (ref 32.0–36.0)
MCV: 95.7 fL (ref 80.0–100.0)
PLATELETS: 89 10*3/uL — AB (ref 150–440)
RBC: 2.81 MIL/uL — AB (ref 3.80–5.20)
RDW: 19.5 % — ABNORMAL HIGH (ref 11.5–14.5)
WBC: 9.5 10*3/uL (ref 3.6–11.0)

## 2018-03-08 LAB — PHOSPHORUS: Phosphorus: 3.3 mg/dL (ref 2.5–4.6)

## 2018-03-08 LAB — URINE CULTURE

## 2018-03-08 LAB — MAGNESIUM: MAGNESIUM: 1.8 mg/dL (ref 1.7–2.4)

## 2018-03-08 MED ORDER — HYDRALAZINE HCL 20 MG/ML IJ SOLN
10.0000 mg | INTRAMUSCULAR | Status: DC | PRN
  Administered 2018-03-08: 10 mg via INTRAVENOUS
  Filled 2018-03-08: qty 1

## 2018-03-08 MED ORDER — ISOSORBIDE MONONITRATE ER 30 MG PO TB24
30.0000 mg | ORAL_TABLET | Freq: Every evening | ORAL | Status: DC
  Administered 2018-03-08 – 2018-03-10 (×3): 30 mg via ORAL
  Filled 2018-03-08 (×3): qty 1

## 2018-03-08 MED ORDER — MESALAMINE 1.2 G PO TBEC
2.4000 g | DELAYED_RELEASE_TABLET | Freq: Every day | ORAL | Status: DC
  Administered 2018-03-08 – 2018-03-10 (×3): 2.4 g via ORAL
  Filled 2018-03-08 (×4): qty 2

## 2018-03-08 MED ORDER — LOSARTAN POTASSIUM 50 MG PO TABS
100.0000 mg | ORAL_TABLET | Freq: Every day | ORAL | Status: DC
  Administered 2018-03-08 – 2018-03-09 (×2): 100 mg via ORAL
  Filled 2018-03-08 (×3): qty 2

## 2018-03-08 MED ORDER — PROPRANOLOL HCL 20 MG PO TABS
20.0000 mg | ORAL_TABLET | Freq: Every day | ORAL | Status: DC
  Administered 2018-03-09 – 2018-03-10 (×2): 20 mg via ORAL
  Filled 2018-03-08 (×4): qty 1

## 2018-03-08 NOTE — Progress Notes (Signed)
[place] [facility] [place] NAME: Gift Hammett    MR#:  [id number]  DATE OF BIRTH:  [birth year]  SUBJECTIVE:  Patient sent to ICU yesterday after EGD showing 3 columns of nonbleeding grade 3 varices status post banding and recent stigmata of bleeding  Doing well this morning. REVIEW OF SYSTEMS:    Review of Systems  Constitutional: Negative for fever, chills weight loss HENT: Negative for ear pain, nosebleeds, congestion, facial swelling, rhinorrhea, neck pain, neck stiffness and ear discharge.   Respiratory: Negative for cough, shortness of breath, wheezing  Cardiovascular: Negative for chest pain, palpitations and leg swelling.  Gastrointestinal: Negative for heartburn, abdominal pain, vomiting, diarrhea or consitpation Genitourinary: Negative for dysuria, urgency, frequency, hematuria Musculoskeletal: Negative for back pain or joint pain Neurological: Negative for dizziness, seizures, syncope, focal weakness,  numbness and headaches.  Hematological: Does not bruise/bleed easily.  Psychiatric/Behavioral: Negative for hallucinations,  dysphoric mood    Tolerating Diet: N.p.o.      DRUG ALLERGIES:   Allergies  Allergen Reactions  . Propofol Other (See Comments)    Reaction: unknown suspect allergy    VITALS:  Blood pressure (!) 167/62, pulse 88, temperature 98.6 F (37 C), temperature source Oral, resp. rate (!) 26, height 5' 2"  (1.575 m), weight 60.5 kg (133 lb 4.8 oz), SpO2 96 %.  PHYSICAL EXAMINATION:  Constitutional: Appears well-developed and well-nourished. No distress. HENT: Normocephalic. Marland Kitchen Oropharynx is clear and moist.  Eyes: Conjunctivae and EOM are normal. PERRLA, no scleral icterus.  Neck: Normal ROM. Neck supple. No JVD. No tracheal deviation. CVS: RRR, S1/S2 +, no murmurs, no gallops, no carotid bruit.  Pulmonary: Effort and breath sounds normal, no stridor, rhonchi, wheezes, rales.  Abdominal: Soft. BS +,  no  distension, tenderness, rebound or guarding.  Musculoskeletal: Normal range of motion. No edema and no tenderness.  Neuro: Alert. CN 2-12 grossly intact. No focal deficits. Skin: Skin is warm and dry. No rash noted. Psychiatric: Normal mood and affect.      LABORATORY PANEL:   CBC Recent Labs  Lab 03/08/18 0552  WBC 9.5  HGB 8.7*  HCT 26.9*  PLT 89*   ------------------------------------------------------------------------------------------------------------------  Chemistries  Recent Labs  Lab 03/06/18 1906  03/08/18 0552  NA 141   < > 141  K 3.7   < > 4.1  CL 111   < > 114*  CO2 23   < > 23  GLUCOSE 124*   < > 113*  BUN 24*   < > 16  CREATININE 0.69   < > 0.62  CALCIUM 8.1*   < > 7.6*  MG  --   --  1.8  AST 30  --   --   ALT 10*  --   --   ALKPHOS 77  --   --   BILITOT 1.4*  --   --    < > = values in this interval not displayed.   ------------------------------------------------------------------------------------------------------------------  Cardiac Enzymes No results for input(s): TROPONINI in the last 168 hours. ------------------------------------------------------------------------------------------------------------------  RADIOLOGY:  Dg Chest Portable 1 View  Result Date: 03/06/2018 CLINICAL DATA:  72 y/o F; episode of hematemesis. History of liver cirrhosis. EXAM: PORTABLE CHEST 1 VIEW COMPARISON:  11/28/2017 chest CT. 10/15/2014 chest radiograph. 12/12/2017 PET-CT. FINDINGS: Stable cardiac silhouette given projection and technique. Calcific aortic atherosclerosis. Ill-defined opacity in the right lung base. Scattered pulmonary nodules better characterized on prior chest CT. No pleural effusion or pneumothorax. Bones are unremarkable.  IMPRESSION: Ill-defined opacity in right lung base may represent atelectasis, pneumonia, or possibly aspiration given recent hematemesis. Electronically Signed   By: Kristine Garbe M.D.   On: 03/06/2018 22:20      ASSESSMENT AND PLAN:   72 year old female with a remote history of thyroid cancer who presents with hematemesis.  1.  Hematemesis with history of liver cirrhosis and acute on chronic anemia:  Patient underwent EGD yesterday showing recent bleeding grade 3 esophageal varices that were banded and type I gastroesophageal varices without bleeding.  There was red blood in the gastric fundus.   Hemoglobin has remained stable as well as vitals this morning.  She has not required blood transfusion Continue octreotide times 72 hours and PPI Clear liquid diet Hemoglobin for a.m.   2.  UTI: Day 2 of Rocephin Follow-up on urine culture  3.  Bipolar: Continue Lamictal, Lexapro  4.  Hyperlipidemia: Continue Zocor  5.  Hypothyroidism: Continue Synthroid  6.  Essential hypertension: Continue losartan and isosorbide   Physical therapy consultation tomorrow for discharge planning   Management plans discussed with the patient and POA and they are in agreement.  CODE STATUS: FULL  TOTAL TIME TAKING CARE OF THIS PATIENT: 30 minutes.     POSSIBLE D/C 1-2 days, DEPENDING ON CLINICAL CONDITION.   Cortavious Nix M.D on 03/08/2018 at 7:56 AM  Between 7am to 6pm - Pager - [phone] After 6pm go to [email or web address] - password [email or web address]  Office  [phone]  CC: Primary care physician; Birdie Sons, MD  Note: This dictation was prepared with Dragon dictation along with smaller phrase technology. Any transcriptional errors that result from this process are unintentional.

## 2018-03-08 NOTE — Consult Note (Signed)
PULMONARY / CRITICAL CARE MEDICINE   Name: Margaret Romero MRN: [id number] DOB: 07-Apr-1946    ADMISSION DATE:  03/06/2018 CONSULTATION DATE:  03/07/2018  REFERRING MD: Dr. Alice Reichert  Recent: Upper GI bleed status post endoscopy with banding of bleeding esophageal varices  HISTORY OF PRESENT ILLNESS:   This is a 72 year old female with a past medical history as indicated below who presented to the ED with complaints of near syncope and hematemesis.  She was taken for an upper endoscopy yesterday and underwent banding of bleeding esophageal varices.  She was brought to the ICU for further monitoring postprocedure.  She has been stable since arrival in the ICU.  Her blood pressure is running in the 180s.  She denies any chest pain palpitations nausea vomiting and abdominal pain. Of note patient has a remote history of alcohol abuse and cirrhosis  PAST MEDICAL HISTORY :  She  has a past medical history of Bipolar affective ([place]), Cancer ([place]), Closed fracture carpal bone (11/16/2015), GERD (gastroesophageal reflux disease), Head injury without fracture of skull, Hypertension, Malignant neoplasm of thyroid gland [facility]) (January 23, 2011), Memory changes, and Thyroid cancer ([place]).  PAST SURGICAL HISTORY: She  has a past surgical history that includes Hernia repair (2007); Coronary stent placement (2007); Cholecystectomy (2006); Thyroidectomy (2012); Colonoscopy (2008); Nasal sinus surgery (2008); Carotid endarterectomy (Right, November 2015); and Skin cancer excision.  Allergies  Allergen Reactions  . Propofol Other (See Comments)    Reaction: unknown suspect allergy    No current facility-administered medications on file prior to encounter.    Current Outpatient Medications on File Prior to Encounter  Medication Sig  . albuterol (PROVENTIL HFA;VENTOLIN HFA) 108 (90 Base) MCG/ACT inhaler Inhale 2 puffs into the lungs every 6 (six) hours as needed for wheezing or shortness of breath.  Marland Kitchen aspirin 81  MG tablet Take 81 mg by mouth daily.  Marland Kitchen donepezil (ARICEPT) 10 MG tablet Take 10 mg by mouth at bedtime.  Marland Kitchen escitalopram (LEXAPRO) 10 MG tablet Take 1 tablet (10 mg total) by mouth daily.  . ferrous sulfate 325 (65 FE) MG tablet Take 325 mg by mouth every evening.  . isosorbide mononitrate (IMDUR) 30 MG 24 hr tablet TAKE 1 TABLET BY MOUTH ONCE DAILY  . lamoTRIgine (LAMICTAL) 25 MG tablet Take 3 tablets (75 mg total) by mouth daily.  Marland Kitchen levothyroxine (SYNTHROID, LEVOTHROID) 75 MCG tablet TAKE 1 TABLET BY MOUTH ONCE A DAY  . losartan (COZAAR) 50 MG tablet Take 1 tablet by mouth daily.  . mesalamine (LIALDA) 1.2 g EC tablet TAKE 2 TABLETS BY MOUTH ONCE A DAY WITH BREAKFAST  . Multiple Vitamins-Minerals (MULTIVITAMIN WITH MINERALS) tablet Take 1 tablet by mouth daily.  . nitroGLYCERIN (NITROSTAT) 0.4 MG SL tablet Place 0.4 mg under the tongue every 5 (five) minutes as needed for chest pain.   . pantoprazole (PROTONIX) 40 MG tablet TAKE 1 TABLET BY MOUTH ONCE A DAY  . simvastatin (ZOCOR) 40 MG tablet Take 1 tablet by mouth daily.    FAMILY HISTORY:  Her indicated that her mother is deceased. She indicated that her father is deceased. She indicated that only one of her three sisters is alive. She indicated that her brother is deceased. She indicated that her daughter is alive.   SOCIAL HISTORY: She  reports that she quit smoking about 43 years ago. She has a 10.00 pack-year smoking history. She has never used smokeless tobacco. She reports that she does not drink alcohol or use drugs.  REVIEW  OF SYSTEMS:   Constitutional: Negative for fever and chills.  HENT: Negative for congestion and rhinorrhea.  Eyes: Negative for redness and visual disturbance.  Respiratory: Negative for shortness of breath and wheezing.  Cardiovascular: Negative for chest pain and palpitations.  Gastrointestinal: Hematemesis that has resolved Genitourinary: Negative for dysuria and urgency.  Endocrine: Denies polyuria,  polyphagia and heat intolerance Musculoskeletal: Negative for myalgias and arthralgias.  Skin: Negative for pallor and wound.  Neurological: Negative for dizziness and headaches   SUBJECTIVE:   VITAL SIGNS: BP (!) 163/58 (BP Location: Right Arm)   Pulse 81   Temp 98.6 F (37 C) (Oral)   Resp (!) 23   Ht 5' 2"  (1.575 m)   Wt 133 lb 4.8 oz (60.5 kg)   SpO2 97%   BMI 24.38 kg/m   HEMODYNAMICS:    VENTILATOR SETTINGS:    INTAKE / OUTPUT: I/O last 3 completed shifts: In: 785.8 [I.V.:685.8; IV Piggyback:100] Out: 800 [Urine:800]  PHYSICAL EXAMINATION: General: NAD Neuro:  AAOX 2, moves all extremities, no focal deficits HEENT: PERRLA, trachea midline, no JVD Cardiovascular:  RRR, S1-S2, no murmur regurg or gallop, +2 pulses bilaterally, no edema Lungs: Clear to auscultation bilaterally Abdomen: Nondistended, normal bowel sounds in all 4 quadrants, palpation reveals no organomegaly Musculoskeletal: Positive range of motion in upper and lower extremities no joint deformities Skin: Warm and dry  LABS:  BMET Recent Labs  Lab 03/06/18 1906 03/07/18 0700  NA 141 141  K 3.7 4.2  CL 111 112*  CO2 23 24  BUN 24* 22*  CREATININE 0.69 0.76  GLUCOSE 124* 104*    Electrolytes Recent Labs  Lab 03/06/18 1906 03/07/18 0700  CALCIUM 8.1* 7.8*    CBC Recent Labs  Lab 03/06/18 1906 03/07/18 0155 03/07/18 0700  WBC 6.2  --  3.6  HGB 10.1* 8.5* 8.3*  HCT 30.2*  --  25.8*  PLT 82*  --  71*    Coag's Recent Labs  Lab 03/07/18 1156  INR 1.49    Sepsis Markers No results for input(s): LATICACIDVEN, PROCALCITON, O2SATVEN in the last 168 hours.  ABG No results for input(s): PHART, PCO2ART, PO2ART in the last 168 hours.  Liver Enzymes Recent Labs  Lab 03/06/18 1906  AST 30  ALT 10*  ALKPHOS 77  BILITOT 1.4*  ALBUMIN 3.0*    Cardiac Enzymes No results for input(s): TROPONINI, PROBNP in the last 168 hours.  Glucose Recent Labs  Lab 03/07/18 1820   GLUCAP 98    Imaging No results found.   ASSESSMENT Acute GI bleed secondary to bleeding esophageal varices-now status post upper endoscopy with banding and subsequent resolution of bleeding Portal hypertension Uncontrolled hypertension   PLAN Hemodynamic monitoring per ICU protocol Maintain systolic blood pressure less than 140/80 Resume all home blood pressure medications Hydralazine as needed every 2 hours for systolic blood pressure greater than 160 Rest of the treatment plan per gastroenterology Patient is stable for transfer out of the ICU  FAMILY  - Updates: no family at bedside.  Will update when available   Magdalene S. [facility] ANP-BC Pulmonary and Critical Care Medicine [facility] Pager [phone] or [phone]  NB: This document was prepared using Dragon voice recognition software and may include unintentional dictation errors.   Pager: [phone]  03/08/2018, 5:47 AM

## 2018-03-08 NOTE — Care Management (Signed)
Patient transferred back to 1C from ICU.  Had EGD with variceal band ligation 4/6.  PT consult is pending for 03/09/2017.

## 2018-03-08 NOTE — Plan of Care (Signed)
  Problem: Education: Goal: Knowledge of General Education information will improve Outcome: Progressing   Problem: Health Behavior/Discharge Planning: Goal: Ability to manage health-related needs will improve Outcome: Progressing   Problem: Clinical Measurements: Goal: Ability to maintain clinical measurements within normal limits will improve Outcome: Progressing Goal: Will remain free from infection Outcome: Progressing Goal: Diagnostic test results will improve Outcome: Progressing Goal: Respiratory complications will improve Outcome: Progressing Goal: Cardiovascular complication will be avoided Outcome: Progressing   Problem: Activity: Goal: Risk for activity intolerance will decrease Outcome: Progressing   Problem: Coping: Goal: Level of anxiety will decrease Outcome: Progressing   Problem: Pain Managment: Goal: General experience of comfort will improve Outcome: Progressing   Problem: Safety: Goal: Ability to remain free from injury will improve Outcome: Progressing

## 2018-03-08 NOTE — Progress Notes (Signed)
[facility] Gastroenterology Inpatient Progress Note  Subjective: Patient seen for follow-up variceal bleed status post EGD with variceal band ligation yesterday.  Patient appears to be doing stable, however, complains of lower abdominal discomfort.  She has a history of ulcerative colitis and has not been recently started on her oral mesalamine  Objective: Vital signs in last 24 hours: Temp:  [97 F (36.1 C)-98.6 F (37 C)] 98.6 F (37 C) (04/07 0800) Pulse Rate:  [71-94] 90 (04/07 0802) Resp:  [10-26] 17 (04/07 0802) BP: (148-196)/(48-71) 156/51 (04/07 0800) SpO2:  [89 %-99 %] 92 % (04/07 1133) Blood pressure (!) 156/51, pulse 90, temperature 98.6 F (37 C), temperature source Oral, resp. rate 17, height 5' 2"  (1.575 m), weight 60.5 kg (133 lb 4.8 oz), SpO2 92 %.    Intake/Output from previous day: 04/06 0701 - 04/07 0700 In: 1018.8 [I.V.:918.8; IV Piggyback:100] Out: 1200 [Urine:1200]  Intake/Output this shift: Total I/O In: 2543.4 [I.V.:2543.4] Out: -    General appearance: Alert, and very mild distress due to abdominal discomfort. Resp: Clear to auscultation Cardio: Regular rate no gallop GI: Soft without guarding, rebound or rigidity.  Mildly tender in the lower abdomen.  No masses.  Bowel sounds positive. Extremities: No edema   Lab Results: Results for orders placed or performed during the hospital encounter of 03/06/18 (from the past 24 hour(s))  Protime-INR     Status: Abnormal   Collection Time: 03/07/18 11:56 AM  Result Value Ref Range   Prothrombin Time 17.9 (H) 11.4 - 15.2 seconds   INR 1.49   Vitamin B12     Status: None   Collection Time: 03/07/18 11:56 AM  Result Value Ref Range   Vitamin B-12 430 180 - 914 pg/mL  Folate     Status: None   Collection Time: 03/07/18 11:56 AM  Result Value Ref Range   Folate 20.6 >5.9 ng/mL  Iron and TIBC     Status: Abnormal   Collection Time: 03/07/18 11:56 AM  Result Value Ref Range   Iron 39 28 - 170 ug/dL    TIBC 185 (L) 250 - 450 ug/dL   Saturation Ratios 21 10.4 - 31.8 %   UIBC 146 ug/dL  Ferritin     Status: None   Collection Time: 03/07/18 11:56 AM  Result Value Ref Range   Ferritin 19 11 - 307 ng/mL  Reticulocytes     Status: Abnormal   Collection Time: 03/07/18 11:56 AM  Result Value Ref Range   Retic Ct Pct 2.5 0.4 - 3.1 %   RBC. 2.41 (L) 3.80 - 5.20 MIL/uL   Retic Count, Absolute 60.3 19.0 - 183.0 K/uL  Glucose, capillary     Status: None   Collection Time: 03/07/18  6:20 PM  Result Value Ref Range   Glucose-Capillary 98 65 - 99 mg/dL  MRSA PCR Screening     Status: None   Collection Time: 03/07/18  6:30 PM  Result Value Ref Range   MRSA by PCR NEGATIVE NEGATIVE  Basic metabolic panel     Status: Abnormal   Collection Time: 03/08/18  5:52 AM  Result Value Ref Range   Sodium 141 135 - 145 mmol/L   Potassium 4.1 3.5 - 5.1 mmol/L   Chloride 114 (H) 101 - 111 mmol/L   CO2 23 22 - 32 mmol/L   Glucose, Bld 113 (H) 65 - 99 mg/dL   BUN 16 6 - 20 mg/dL   Creatinine, Ser 0.62 0.44 - 1.00 mg/dL  Calcium 7.6 (L) 8.9 - 10.3 mg/dL   GFR calc non Af Amer >60 >60 mL/min   GFR calc Af Amer >60 >60 mL/min   Anion gap 4 (L) 5 - 15  CBC     Status: Abnormal   Collection Time: 03/08/18  5:52 AM  Result Value Ref Range   WBC 9.5 3.6 - 11.0 K/uL   RBC 2.81 (L) 3.80 - 5.20 MIL/uL   Hemoglobin 8.7 (L) 12.0 - 16.0 g/dL   HCT 26.9 (L) 35.0 - 47.0 %   MCV 95.7 80.0 - 100.0 fL   MCH 30.9 26.0 - 34.0 pg   MCHC 32.2 32.0 - 36.0 g/dL   RDW 19.5 (H) 11.5 - 14.5 %   Platelets 89 (L) 150 - 440 K/uL  Magnesium     Status: None   Collection Time: 03/08/18  5:52 AM  Result Value Ref Range   Magnesium 1.8 1.7 - 2.4 mg/dL  Phosphorus     Status: None   Collection Time: 03/08/18  5:52 AM  Result Value Ref Range   Phosphorus 3.3 2.5 - 4.6 mg/dL     Recent Labs    03/06/18 1906 03/07/18 0155 03/07/18 0700 03/08/18 0552  WBC 6.2  --  3.6 9.5  HGB 10.1* 8.5* 8.3* 8.7*  HCT 30.2*  --   25.8* 26.9*  PLT 82*  --  71* 89*   BMET Recent Labs    03/06/18 1906 03/07/18 0700 03/08/18 0552  NA 141 141 141  K 3.7 4.2 4.1  CL 111 112* 114*  CO2 23 24 23   GLUCOSE 124* 104* 113*  BUN 24* 22* 16  CREATININE 0.69 0.76 0.62  CALCIUM 8.1* 7.8* 7.6*   LFT Recent Labs    03/06/18 1906  PROT 5.4*  ALBUMIN 3.0*  AST 30  ALT 10*  ALKPHOS 77  BILITOT 1.4*   PT/INR Recent Labs    03/07/18 1156  LABPROT 17.9*  INR 1.49   Hepatitis Panel No results for input(s): HEPBSAG, HCVAB, HEPAIGM, HEPBIGM in the last 72 hours. C-Diff No results for input(s): CDIFFTOX in the last 72 hours. No results for input(s): CDIFFPCR in the last 72 hours.   Studies/Results: Dg Chest Portable 1 View  Result Date: 03/06/2018 CLINICAL DATA:  72 y/o F; episode of hematemesis. History of liver cirrhosis. EXAM: PORTABLE CHEST 1 VIEW COMPARISON:  11/28/2017 chest CT. 10/15/2014 chest radiograph. 12/12/2017 PET-CT. FINDINGS: Stable cardiac silhouette given projection and technique. Calcific aortic atherosclerosis. Ill-defined opacity in the right lung base. Scattered pulmonary nodules better characterized on prior chest CT. No pleural effusion or pneumothorax. Bones are unremarkable. IMPRESSION: Ill-defined opacity in right lung base may represent atelectasis, pneumonia, or possibly aspiration given recent hematemesis. Electronically Signed   By: Kristine Garbe M.D.   On: 03/06/2018 22:20    Scheduled Inpatient Medications:   . escitalopram  10 mg Oral Daily  . isosorbide mononitrate  30 mg Oral QPM  . lamoTRIgine  75 mg Oral Daily  . levothyroxine  75 mcg Oral QAC breakfast  . losartan  100 mg Oral Daily  . mouth rinse  15 mL Mouth Rinse BID  . pantoprazole (PROTONIX) IV  40 mg Intravenous Q12H  . simvastatin  40 mg Oral Daily    Continuous Inpatient Infusions:   . cefTRIAXone (ROCEPHIN)  IV 1 g (03/08/18 1107)  . octreotide  (SANDOSTATIN)    IV infusion 50 mcg/hr (03/08/18 0800)     PRN Inpatient Medications:  hydrALAZINE, ondansetron **OR**  ondansetron (ZOFRAN) IV  Miscellaneous: Not applicable  Assessment:   1.  Hematemesis secondary to esophageal variceal bleed.  Status post endoscopic variceal ligation x3.  No further bleeding noted.  On octreotide drip.  2.  Small gastric varix.  No contribution to bleeding, however.  3.  History of ulcerative colitis per family-patient will need to resume medication  Plan:  1.  Resume Lialda for history of colitis.  2.  Add a beta-blocker for variceal bleeding prophylaxis  3.  Advance diet.  [place] K. Alice Reichert, M.D. 03/08/2018, 11:39 AM

## 2018-03-08 NOTE — Progress Notes (Signed)
Patient alert and oriented to self, place, situation. NO BM/emesis this shift. Diet advanced this am, awaiting tray from dining. VSS. Report of soreness to abdomen, pt refused pain medication at this time. mTransfer to 1C, report called. Missing abx dose, pharmacy notified.

## 2018-03-09 ENCOUNTER — Encounter: Payer: Self-pay | Admitting: Internal Medicine

## 2018-03-09 ENCOUNTER — Inpatient Hospital Stay: Payer: Medicare Other

## 2018-03-09 LAB — BASIC METABOLIC PANEL
Anion gap: 3 — ABNORMAL LOW (ref 5–15)
BUN: 13 mg/dL (ref 6–20)
CALCIUM: 7.7 mg/dL — AB (ref 8.9–10.3)
CO2: 26 mmol/L (ref 22–32)
CREATININE: 0.63 mg/dL (ref 0.44–1.00)
Chloride: 113 mmol/L — ABNORMAL HIGH (ref 101–111)
GFR calc Af Amer: >60 mL/min (ref >60)
GFR calc non Af Amer: >60 mL/min (ref >60)
GLUCOSE: 120 mg/dL — AB (ref 65–99)
Potassium: 3.7 mmol/L (ref 3.5–5.1)
Sodium: 142 mmol/L (ref 135–145)

## 2018-03-09 LAB — CBC
HEMATOCRIT: 22.4 % — AB (ref 35.0–47.0)
Hemoglobin: 7.5 g/dL — ABNORMAL LOW (ref 12.0–16.0)
MCH: 31.3 pg (ref 26.0–34.0)
MCHC: 33.2 g/dL (ref 32.0–36.0)
MCV: 94.2 fL (ref 80.0–100.0)
Platelets: 73 10*3/uL — ABNORMAL LOW (ref 150–440)
RBC: 2.38 MIL/uL — ABNORMAL LOW (ref 3.80–5.20)
RDW: 19 % — AB (ref 11.5–14.5)
WBC: 6.9 10*3/uL (ref 3.6–11.0)

## 2018-03-09 LAB — HEMOGLOBIN: Hemoglobin: 7.4 g/dL — ABNORMAL LOW (ref 12.0–16.0)

## 2018-03-09 MED ORDER — PANTOPRAZOLE SODIUM 40 MG PO TBEC
40.0000 mg | DELAYED_RELEASE_TABLET | Freq: Two times a day (BID) | ORAL | Status: DC
  Administered 2018-03-10 (×2): 40 mg via ORAL
  Filled 2018-03-09 (×2): qty 1

## 2018-03-09 NOTE — Progress Notes (Signed)
[place] [facility] [place] NAME: Margaret Romero    MR#:  [id number]  DATE OF BIRTH:  06/17/1946  SUBJECTIVE:  CHIEF COMPLAINT:   Chief Complaint  Patient presents with  . Hematemesis   - no further bleeding at this time, status post esophageal variceal banding. -Hemoglobin dropped today. Also acutely requiring 2 L oxygen still. Feels weak overall.  REVIEW OF SYSTEMS:  Review of Systems  Constitutional: Positive for malaise/fatigue. Negative for chills and fever.  Eyes: Negative for blurred vision and double vision.  Respiratory: Positive for shortness of breath. Negative for cough and wheezing.   Cardiovascular: Negative for chest pain and palpitations.  Gastrointestinal: Negative for abdominal pain, constipation, diarrhea, nausea and vomiting.  Genitourinary: Negative for dysuria.  Musculoskeletal: Negative for myalgias.  Neurological: Negative for dizziness, focal weakness, seizures, weakness and headaches.  Psychiatric/Behavioral: Negative for depression.    DRUG ALLERGIES:   Allergies  Allergen Reactions  . Propofol Other (See Comments)    Reaction: unknown suspect allergy    VITALS:  Blood pressure (!) 157/62, pulse 72, temperature 98.9 F (37.2 C), temperature source Oral, resp. rate (!) 24, height 5' 2"  (1.575 m), weight 60.5 kg (133 lb 4.8 oz), SpO2 97 %.  PHYSICAL EXAMINATION:  Physical Exam  GENERAL:  72 y.o.-year-old patient lying in the bed with no acute distress.  EYES: Pupils equal, round, reactive to light and accommodation. No scleral icterus. Extraocular muscles intact.  HEENT: Head atraumatic, normocephalic. Oropharynx and nasopharynx clear.  NECK:  Supple, no jugular venous distention. No thyroid enlargement, no tenderness.  LUNGS: Normal breath sounds bilaterally, no wheezing, rales,rhonchi or crepitation. No use of accessory muscles of respiration. decreased bibasilar breath sounds CARDIOVASCULAR: S1, S2  normal. No murmurs, rubs, or gallops.  ABDOMEN: Soft, nontender but some tenderness in epigastric region, nondistended. Bowel sounds present. No organomegaly or mass.  EXTREMITIES: No pedal edema, cyanosis, or clubbing.  NEUROLOGIC: Cranial nerves II through XII are intact. Muscle strength 5/5 in all extremities. Sensation intact. Gait not checked. Global weakness noted PSYCHIATRIC: The patient is alert and oriented x 3.  SKIN: No obvious rash, lesion, or ulcer.    LABORATORY PANEL:   CBC Recent Labs  Lab 03/09/18 0415  WBC 6.9  HGB 7.5*  HCT 22.4*  PLT 73*   ------------------------------------------------------------------------------------------------------------------  Chemistries  Recent Labs  Lab 03/06/18 1906  03/08/18 0552 03/09/18 0415  NA 141   < > 141 142  K 3.7   < > 4.1 3.7  CL 111   < > 114* 113*  CO2 23   < > 23 26  GLUCOSE 124*   < > 113* 120*  BUN 24*   < > 16 13  CREATININE 0.69   < > 0.62 0.63  CALCIUM 8.1*   < > 7.6* 7.7*  MG  --   --  1.8  --   AST 30  --   --   --   ALT 10*  --   --   --   ALKPHOS 77  --   --   --   BILITOT 1.4*  --   --   --    < > = values in this interval not displayed.   ------------------------------------------------------------------------------------------------------------------  Cardiac Enzymes No results for input(s): TROPONINI in the last 168 hours. ------------------------------------------------------------------------------------------------------------------  RADIOLOGY:  No results found.  EKG:   Orders placed or performed during the hospital encounter of 03/06/18  . EKG 12-Lead  .  EKG 12-Lead    ASSESSMENT AND PLAN:   72 year old female with past medical history significant for liver cirrhosis diagnosed 3 months ago, remote history of thyroid cancer came in with hematemesis.  1. Hematemesis-secondary to upper GI bleed, esophageal varices -Status post EGD and banding of the esophageal varices. -Stop  octreotide drip after 72 hours. On IV PPI-changed to oral twice a day today -Appreciate GI consult -On a regular diet  2. Acute on chronic anemia- hb drop noted from 10 on adm to 8.7, to 7.5 today - repeat hb today - no active bleeding - monitor closely and transfuse if hb <7  3. Hypoxia- likely atelectasis and hypoventilation and anemia - CXR today, encourage incentive spirometry - wean off o2 as tolerated  4. Bipolar disorder-continue Lexapro and Lamictal  5. Urinary tract infection-urine cultures with multiple species. Currently on Rocephin. Stop after 5 days  6. Hypertension-Imdur and Cozaar  7. DVT prophylaxis-ted's and SCDs. Encourage ambulation     All the records are reviewed and case discussed with Care Management/Social Workerr. Management plans discussed with the patient, family and they are in agreement.  CODE STATUS: full code  TOTAL TIME TAKING CARE OF THIS PATIENT: 38 minutes.   POSSIBLE D/C tomorrow, DEPENDING ON CLINICAL CONDITION.   Gladstone Lighter M.D on 03/09/2018 at 11:04 AM  Between 7am to 6pm - Pager - [phone]  After 6pm go to [email or web address] - password [email or web address] [facility]  Office  [phone]  CC: Primary care physician; Birdie Sons, MD

## 2018-03-09 NOTE — Evaluation (Signed)
Physical Therapy Evaluation Patient Details Name: Margaret Romero MRN: [id number] DOB: 10/11/1946 Today's Date: 03/09/2018   History of Present Illness  Pt is a 72 y/o F who presented to hospital 03/06/18 w/ hematemesis. Pt admitted 03/06/18 w/ diagnosis of GI bleed. PMHx includes: bipolar, cancer, GERD, head injury,memory changes, thyroid cancer, s/p EGD (03/07/18) with banding of esophageal varices.     Clinical Impression  Pt demonstrated, while on 2L oxygen, bed mobility (CGA to min assist), transfers and ambulation 40 ft (RW CGA). Pt fatigues quickly, denies any pain, but demonstrates mild SOB during activity, while on 2L oxygen. Pt's roommate (Flonnie) was present during session and reported that she and Pt's family are available for intermittent supervision when Pt goes home. Pt would benefit from skilled PT to progress strength, activity tolerance, and optimal return to PLOF. Recommend HHPT w/ intermittent supervision upon discharge from acute hospitalization.    Follow Up Recommendations Home health PT;Supervision - Intermittent    Equipment Recommendations  Rolling walker with 5" wheels;3in1 (PT)    Recommendations for Other Services       Precautions / Restrictions Precautions Precautions: Fall Restrictions Weight Bearing Restrictions: No      Mobility  Bed Mobility Overal bed mobility: Needs Assistance Bed Mobility: Supine to Sit     Supine to sit: Min assist;Min guard     General bed mobility comments: required extra time and effort; CGA to min assist for safety and assist to bring trunk forward into sitting position  Transfers Overall transfer level: Needs assistance Equipment used: Rolling walker (2 wheeled) Transfers: Sit to/from Stand Sit to Stand: Min guard         General transfer comment: CGA for safety; vc's to scoot to edge, use one hand to push off bed and the other on RW; Pt required 2 attempts to stand from bed in lowest  position  Ambulation/Gait Ambulation/Gait assistance: Min guard Ambulation Distance (Feet): 40 Feet Assistive device: Rolling walker (2 wheeled) Gait Pattern/deviations: Step-through pattern;Decreased step length - left;Decreased stance time - right Gait velocity: decreased   General Gait Details: Pt ambulated 40 ft RW CGA, decreased velocity; Pt stated she was feeling a little tired when she reached the doorway, but stated she was able to ambulate to recliner.  Stairs            Wheelchair Mobility    Modified Rankin (Stroke Patients Only)       Balance Overall balance assessment: Needs assistance Sitting-balance support: Bilateral upper extremity supported Sitting balance-Leahy Scale: Poor Sitting balance - Comments: Pt used BUE on bed to maintain sitting posture     Standing balance-Leahy Scale: Poor Standing balance comment: RW CGA for stablility and safety                             Pertinent Vitals/Pain Pain Assessment: No/denies pain Pain Score: 0-No pain Pain Intervention(s): Limited activity within patient's tolerance;Monitored during session;Repositioned    Home Living Family/patient expects to be discharged to:: Private residence Living Arrangements: Non-relatives/Friends(Flonnie) Available Help at Discharge: Family;Friend(s);Available PRN/intermittently Type of Home: House Home Access: Stairs to enter Entrance Stairs-Rails: Left;Right(cannot reach both) Entrance Stairs-Number of Steps: 4 Home Layout: One level Home Equipment: None      Prior Function Level of Independence: Independent         Comments: Pt and friend (Flonnie) report Pt has been fully Independend with all ADLs, driving, and working part time cleaning houses prior  to acute hospitalization     Hand Dominance   Dominant Hand: Right    Extremity/Trunk Assessment   Upper Extremity Assessment Upper Extremity Assessment: Generalized weakness RUE Deficits /  Details: Pt demonstrates AROM WNL and strength at least 3/5 shoulder flexion, elbow flexion/extension, supination/pronation LUE Deficits / Details: Pt demonstrates AROM WNL and strength at least 3/5 shoulder flexion, elbow flexion/extension, supination/pronation    Lower Extremity Assessment Lower Extremity Assessment: Generalized weakness;RLE deficits/detail;LLE deficits/detail RLE Deficits / Details: Pt demonstrates AROM WNL and strength at least 3/5 hip flexion, knee flexion/extension, dorsiflexion/plantarflexion; demonstrated by bringing knees to chest, standing marching LLE Deficits / Details: Pt demonstrates AROM WNL and strength at least 3/5 hip flexion, knee flexion/extension, dorsiflexion/plantarflexion; demonstrated by bringing knees to chest, standing marching    Cervical / Trunk Assessment Cervical / Trunk Assessment: Normal  Communication   Communication: No difficulties  Cognition Arousal/Alertness: Awake/alert Behavior During Therapy: WFL for tasks assessed/performed Overall Cognitive Status: Within Functional Limits for tasks assessed                                 General Comments: Pt A&O x 4      General Comments  Pt agreeable to session    Exercises     Assessment/Plan    PT Assessment Patient needs continued PT services  PT Problem List Decreased strength;Decreased range of motion;Decreased knowledge of use of DME;Decreased activity tolerance;Decreased safety awareness;Decreased balance;Decreased knowledge of precautions;Decreased mobility;Decreased coordination;Cardiopulmonary status limiting activity       PT Treatment Interventions DME instruction;Balance training;Gait training;Stair training;Functional mobility training;Therapeutic activities;Therapeutic exercise;Patient/family education    PT Goals (Current goals can be found in the Care Plan section)  Acute Rehab PT Goals Patient Stated Goal: I want to go home PT Goal Formulation: With  patient Time For Goal Achievement: 03/23/18 Potential to Achieve Goals: Good    Frequency Min 2X/week   Barriers to discharge        Co-evaluation               AM-PAC PT "6 Clicks" Daily Activity  Outcome Measure Difficulty turning over in bed (including adjusting bedclothes, sheets and blankets)?: A Little Difficulty moving from lying on back to sitting on the side of the bed? : A Little Difficulty sitting down on and standing up from a chair with arms (e.g., wheelchair, bedside commode, etc,.)?: Unable Help needed moving to and from a bed to chair (including a wheelchair)?: A Little Help needed walking in hospital room?: A Little Help needed climbing 3-5 steps with a railing? : A Lot 6 Click Score: 15    End of Session Equipment Utilized During Treatment: Gait belt;Oxygen Activity Tolerance: Patient limited by fatigue Patient left: in chair;with call bell/phone within reach;with chair alarm set;with family/visitor present(B heels elevated by pillow) Nurse Communication: Mobility status PT Visit Diagnosis: Unsteadiness on feet (R26.81);Difficulty in walking, not elsewhere classified (R26.2);Other abnormalities of gait and mobility (R26.89);Muscle weakness (generalized) (M62.81)    Time: 5625-6389 PT Time Calculation (min) (ACUTE ONLY): 30 min   Charges:         PT G Codes:         Akeylah Hendel Mondrian-Pardue, SPT 03/09/2018, 10:29 AM

## 2018-03-09 NOTE — Care Management Important Message (Signed)
Important Message  Patient Details  Name: Margaret Romero MRN: [id number] Date of Birth: 1946/04/29   Medicare Important Message Given:  Yes    [name] 03/09/2018, 3:13 PM

## 2018-03-10 LAB — PREPARE RBC (CROSSMATCH)

## 2018-03-10 LAB — CBC
HEMATOCRIT: 21.4 % — AB (ref 35.0–47.0)
Hemoglobin: 7.1 g/dL — ABNORMAL LOW (ref 12.0–16.0)
MCH: 31.8 pg (ref 26.0–34.0)
MCHC: 33.1 g/dL (ref 32.0–36.0)
MCV: 96 fL (ref 80.0–100.0)
Platelets: 68 10*3/uL — ABNORMAL LOW (ref 150–440)
RBC: 2.23 MIL/uL — ABNORMAL LOW (ref 3.80–5.20)
RDW: 18.8 % — AB (ref 11.5–14.5)
WBC: 5.7 10*3/uL (ref 3.6–11.0)

## 2018-03-10 MED ORDER — FUROSEMIDE 10 MG/ML IJ SOLN
20.0000 mg | Freq: Once | INTRAMUSCULAR | Status: AC
  Administered 2018-03-10: 17:00:00 20 mg via INTRAVENOUS
  Filled 2018-03-10: qty 2

## 2018-03-10 MED ORDER — PANTOPRAZOLE SODIUM 40 MG PO TBEC: 40.0000 mg | DELAYED_RELEASE_TABLET | Freq: Two times a day (BID) | ORAL | 2 refills | Status: DC

## 2018-03-10 MED ORDER — NADOLOL 20 MG PO TABS: 20.0000 mg | ORAL_TABLET | Freq: Every day | ORAL | 2 refills | Status: DC

## 2018-03-10 MED ORDER — NADOLOL 20 MG PO TABS
20.0000 mg | ORAL_TABLET | Freq: Every day | ORAL | Status: DC
  Filled 2018-03-10: qty 1

## 2018-03-10 MED ORDER — HYDROCODONE-ACETAMINOPHEN 5-325 MG PO TABS: 1.0000 | ORAL_TABLET | ORAL | Status: DC | PRN

## 2018-03-10 MED ORDER — SODIUM CHLORIDE 0.9 % IV SOLN
Freq: Once | INTRAVENOUS | Status: AC
  Administered 2018-03-10: 12:00:00 via INTRAVENOUS

## 2018-03-10 NOTE — Progress Notes (Signed)
Received Md order to discharge patient to home, reviewed homes meds prescriptions and follow up appoints with patient and her POA and both verbalized understanding

## 2018-03-10 NOTE — Plan of Care (Signed)
  Problem: Education: Goal: Knowledge of General Education information will improve Outcome: Progressing   Problem: Health Behavior/Discharge Planning: Goal: Ability to manage health-related needs will improve Outcome: Progressing   Problem: Coping: Goal: Level of anxiety will decrease Outcome: Progressing   Problem: Pain Managment: Goal: General experience of comfort will improve Outcome: Progressing   Problem: Safety: Goal: Ability to remain free from injury will improve Outcome: Progressing

## 2018-03-10 NOTE — Progress Notes (Signed)
Patient has mentioned pain intermittently in abdominal area. Contacted MD about need for PRN pain medication. See new order.

## 2018-03-10 NOTE — Progress Notes (Addendum)
[facility] [place] NAME: Margaret Romero    MR#:  [id number]  DATE OF BIRTH:  Feb 23, 1946  SUBJECTIVE:  CHIEF COMPLAINT:   Chief Complaint  Patient presents with  . Hematemesis   - no further bleeding at this time, status post esophageal variceal banding. -Hemoglobin dropped to 7. 1 unit tx today - on 1L o2  REVIEW OF SYSTEMS:  Review of Systems  Constitutional: Positive for malaise/fatigue. Negative for chills and fever.  Eyes: Negative for blurred vision and double vision.  Respiratory: Negative for cough, shortness of breath and wheezing.   Cardiovascular: Negative for chest pain and palpitations.  Gastrointestinal: Negative for abdominal pain, constipation, diarrhea, nausea and vomiting.  Genitourinary: Negative for dysuria.  Musculoskeletal: Negative for myalgias.  Neurological: Negative for dizziness, focal weakness, seizures, weakness and headaches.  Psychiatric/Behavioral: Negative for depression.    DRUG ALLERGIES:   Allergies  Allergen Reactions  . Propofol Other (See Comments)    Reaction: unknown suspect allergy    VITALS:  Blood pressure (!) 120/46, pulse 60, temperature 98.1 F (36.7 C), temperature source Oral, resp. rate 15, height 5' 2"  (1.575 m), weight 60.5 kg (133 lb 4.8 oz), SpO2 96 %.  PHYSICAL EXAMINATION:  Physical Exam  GENERAL:  72 y.o.-year-old patient lying in the bed with no acute distress.  EYES: Pupils equal, round, reactive to light and accommodation. No scleral icterus. Extraocular muscles intact.  HEENT: Head atraumatic, normocephalic. Oropharynx and nasopharynx clear.  NECK:  Supple, no jugular venous distention. No thyroid enlargement, no tenderness.  LUNGS: Normal breath sounds bilaterally, no wheezing, rales,rhonchi or crepitation. No use of accessory muscles of respiration. decreased bibasilar breath sounds CARDIOVASCULAR: S1, S2 normal. No murmurs, rubs, or gallops.  ABDOMEN: Soft,  nontender but some tenderness in epigastric region, nondistended. Bowel sounds present. No organomegaly or mass.  EXTREMITIES: No pedal edema, cyanosis, or clubbing.  NEUROLOGIC: Cranial nerves II through XII are intact. Muscle strength 5/5 in all extremities. Sensation intact. Gait not checked. Global weakness noted PSYCHIATRIC: The patient is alert and oriented x 3.  SKIN: No obvious rash, lesion, or ulcer.    LABORATORY PANEL:   CBC Recent Labs  Lab 03/10/18 0356  WBC 5.7  HGB 7.1*  HCT 21.4*  PLT 68*   ------------------------------------------------------------------------------------------------------------------  Chemistries  Recent Labs  Lab 03/06/18 1906  03/08/18 0552 03/09/18 0415  NA 141   < > 141 142  K 3.7   < > 4.1 3.7  CL 111   < > 114* 113*  CO2 23   < > 23 26  GLUCOSE 124*   < > 113* 120*  BUN 24*   < > 16 13  CREATININE 0.69   < > 0.62 0.63  CALCIUM 8.1*   < > 7.6* 7.7*  MG  --   --  1.8  --   AST 30  --   --   --   ALT 10*  --   --   --   ALKPHOS 77  --   --   --   BILITOT 1.4*  --   --   --    < > = values in this interval not displayed.   ------------------------------------------------------------------------------------------------------------------  Cardiac Enzymes No results for input(s): TROPONINI in the last 168 hours. ------------------------------------------------------------------------------------------------------------------  RADIOLOGY:  Dg Chest 2 View  Result Date: 03/09/2018 CLINICAL DATA:  Hypoxia EXAM: CHEST - 2 VIEW COMPARISON:  03/06/2018 chest radiograph. FINDINGS: Low lung  volumes. Stable cardiomediastinal silhouette with borderline mild cardiomegaly. No pneumothorax. Small bilateral pleural effusions, increased bilaterally. Vascular crowding without overt pulmonary edema. Mild patchy bibasilar lung opacities, slightly increased. IMPRESSION: 1. Low lung volumes. Slightly increased mild patchy bibasilar lung opacities, favor  atelectasis, difficult to exclude a component of pneumonia or aspiration. 2. Small bilateral pleural effusions, increased bilaterally. 3. Stable borderline mild cardiomegaly without overt pulmonary edema. Electronically Signed   By: Ilona Sorrel M.D.   On: 03/09/2018 12:22    EKG:   Orders placed or performed during the hospital encounter of 03/06/18  . EKG 12-Lead  . EKG 12-Lead    ASSESSMENT AND PLAN:   72 year old female with past medical history significant for liver cirrhosis diagnosed 3 months ago, remote history of thyroid cancer came in with hematemesis.  1. Hematemesis-secondary to upper GI bleed, esophageal varices -Status post EGD and banding of the esophageal varices. -received octreotide drip for 72 hours. On IV PPI-changed to oral twice a day. -Appreciate GI consult -On a regular diet- tolerating well - repeat 2 week EGD as outpatient  2. Acute on chronic anemia- hb drop noted from 10 on adm to 8.7, to 7.1  - no active bleeding - 1 Unit Tx today -iron pills at discharge  3. Hypoxia- likely atelectasis and hypoventilation and anemia - CXR showing the same, has cardiomegaly but no pulmonary edema - encourage incentive spirometry - wean off o2 as tolerated  4. Bipolar disorder-continue Lexapro and Lamictal  5. Urinary tract infection-urine cultures with multiple species. Currently on Rocephin. Stop at discharge  6. Hypertension-Imdur Add nadolol  7. DVT prophylaxis-ted's and SCDs. Encourage ambulation   POA and friend updated at bedside   All the records are reviewed and case discussed with Care Management/Social Workerr. Management plans discussed with the patient, family and they are in agreement.  CODE STATUS: full code  TOTAL TIME TAKING CARE OF THIS PATIENT: 37 minutes.   POSSIBLE D/C today or tomorrow, DEPENDING ON CLINICAL CONDITION.   Gladstone Lighter M.D on 03/10/2018 at 12:53 PM  Between 7am to 6pm - Pager - [phone]  After 6pm go to  [email or web address] - password [email or web address] [facility]  Office  [phone]  CC: Primary care physician; Birdie Sons, MD

## 2018-03-10 NOTE — Progress Notes (Signed)
Physical Therapy Treatment Patient Details Name: Margaret Romero MRN: [id number] DOB: 11/15/1946 Today's Date: 03/10/2018    History of Present Illness Pt is a 72 y/o F who presented to hospital 03/06/18 w/ hematemesis. Pt admitted 03/06/18 w/ diagnosis of GI bleed. PMHx includes: bipolar, cancer, GERD, head injury,memory changes, thyroid cancer, s/p EGD (03/07/18) with banding of esophageal varices.     PT Comments    Pt receiving blood transfusion during session; RN cleared PT to perform bed exercises w/ Pt.  Pt tolerated bed exercises well and reported she was "feeling better". HR and O2 sats monitored throughout session HR (64-65 bpm) O2 sats (89-92%). Pt instructed in breathing exercises to improve O2 sats. Pt and family educated about safe use of DME. PT will focus on progression of  strength and activity tolerance at next session.   Follow Up Recommendations  Home health PT;Supervision - Intermittent     Equipment Recommendations  Rolling walker with 5" wheels;3in1 (PT)    Recommendations for Other Services       Precautions / Restrictions Precautions Precautions: Fall Restrictions Weight Bearing Restrictions: No    Mobility  Bed Mobility               General bed mobility comments: Deferred; receiving blood transfusion during session  Transfers                 General transfer comment: Deferred; receiving blood transfusion during session  Ambulation/Gait             General Gait Details: Deferred; receiving blood transfusion during session   Stairs            Wheelchair Mobility    Modified Rankin (Stroke Patients Only)       Balance       Sitting balance - Comments: Deferred; receiving blood transfusion during session       Standing balance comment: Deferred; receiving blood transfusion during session                            Cognition Arousal/Alertness: Awake/alert Behavior During Therapy: WFL for tasks  assessed/performed Overall Cognitive Status: Within Functional Limits for tasks assessed                                 General Comments: Pt A&O x 4      Exercises Total Joint Exercises Ankle Circles/Pumps: AROM;Strengthening;Both;Supine;20 reps(HOB elevated) Quad Sets: AROM;Strengthening;Both;10 reps;Supine(HOB elevated) Towel Squeeze: AROM;Strengthening;Both;10 reps;Supine(HOB elevated; Pillow squeeze) Short Arc Quad: AROM;Strengthening;Both;10 reps;Supine(HOB elevated) Heel Slides: AROM;Strengthening;Both;10 reps;Supine(HOB elevated) Hip ABduction/ADduction: AROM;Strengthening;Both;10 reps;Supine(HOB elevated) Straight Leg Raises: AROM;Strengthening;Both;10 reps;Supine(HOB elevated) General Exercises - Upper Extremity Shoulder Flexion: AROM;Left;10 reps;Supine;Strengthening(HOB elevated) Elbow Flexion: AROM;Strengthening;Left;10 reps;Supine(HOB elevated) Elbow Extension: AROM;Strengthening;Left;10 reps;Supine(HOB elevated)    General Comments  Pt agreeable to session      Pertinent Vitals/Pain Pain Assessment: No/denies pain Pain Score: 0-No pain Pain Intervention(s): Limited activity within patient's tolerance;Monitored during session;Repositioned    Home Living                      Prior Function            PT Goals (current goals can now be found in the care plan section) Acute Rehab PT Goals Patient Stated Goal: I want to go home PT Goal Formulation: With patient Time For Goal Achievement: 03/23/18 Potential to Achieve Goals: Good  Progress towards PT goals: Progressing toward goals    Frequency    Min 2X/week      PT Plan      Co-evaluation              AM-PAC PT "6 Clicks" Daily Activity  Outcome Measure  Difficulty turning over in bed (including adjusting bedclothes, sheets and blankets)?: A Little Difficulty moving from lying on back to sitting on the side of the bed? : A Little Difficulty sitting down on and  standing up from a chair with arms (e.g., wheelchair, bedside commode, etc,.)?: Unable Help needed moving to and from a bed to chair (including a wheelchair)?: A Little Help needed walking in hospital room?: A Little Help needed climbing 3-5 steps with a railing? : A Lot 6 Click Score: 15    End of Session Equipment Utilized During Treatment: Gait belt Activity Tolerance: Patient tolerated treatment well Patient left: in bed;with call bell/phone within reach;with bed alarm set;with family/visitor present Nurse Communication: Mobility status PT Visit Diagnosis: Unsteadiness on feet (R26.81);Difficulty in walking, not elsewhere classified (R26.2);Other abnormalities of gait and mobility (R26.89);Muscle weakness (generalized) (M62.81)     Time: 2703-5009 PT Time Calculation (min) (ACUTE ONLY): 23 min  Charges:                       G Codes:        Wilson Dusenbery Mondrian-Pardue, SPT 03/10/2018, 3:14 PM

## 2018-03-11 ENCOUNTER — Telehealth: Payer: Self-pay

## 2018-03-11 LAB — TYPE AND SCREEN
ABO/RH(D): A POS
Antibody Screen: NEGATIVE
UNIT DIVISION: 0

## 2018-03-11 LAB — BPAM RBC
BLOOD PRODUCT EXPIRATION DATE: 201904302359
ISSUE DATE / TIME: 201904091307
UNIT TYPE AND RH: 6200

## 2018-03-11 NOTE — Discharge Summary (Signed)
[facility] [place] NAME: Margaret Romero    MR#:  [id number]  DATE OF BIRTH:  1946/02/16  DATE OF ADMISSION:  03/06/2018   ADMITTING PHYSICIAN: Lance Coon, MD  DATE OF DISCHARGE: 03/10/2018  8:00 PM  PRIMARY CARE PHYSICIAN: Birdie Sons, MD   ADMISSION DIAGNOSIS:   Upper GI bleed [K92.2]  DISCHARGE DIAGNOSIS:   Principal Problem:   Hematemesis Active Problems:   Hypertension   HLD (hyperlipidemia)   CAD in native artery   Anxiety disorder   GERD (gastroesophageal reflux disease)   Cirrhosis ([place])   SECONDARY DIAGNOSIS:   Past Medical History:  Diagnosis Date  . Bipolar affective ([place])   . Cancer [facility])    thyroid cancer   . Carotid stenosis    carotid artery stenosis status post extended ICU hospitalization after carotid endarterectomy around 2017  . Cirrhosis ([place])   . Closed fracture carpal bone 11/16/2015  . Dementia   . Esophageal varices ([place])   . GERD (gastroesophageal reflux disease)   . Head injury without fracture of skull   . Hypertension   . Malignant neoplasm of thyroid gland [facility]) January 23, 2011   medullary carcinoma thyroid T2, Nx.  . Memory changes   . Thyroid cancer [facility])     HOSPITAL COURSE:   72 year old female with past medical history significant for liver cirrhosis diagnosed 3 months ago, remote history of thyroid cancer came in with hematemesis.  1. Hematemesis-secondary to upper GI bleed, esophageal varices -Status post EGD and banding of the esophageal varices. -received octreotide drip for 72 hours. On IV PPI-changed to oral . -Appreciate GI consult -On a regular diet- tolerating well - repeat in 2 week EGD as outpatient  2. Acute on chronic anemia- hb drop noted from 10 on adm to 8.7, to 7.1  - no active bleeding - 1 Unit Tx prior to discharge -iron pills at discharge  3. Hypoxia- likely atelectasis and hypoventilation and anemia - CXR showing the same, has cardiomegaly  but no pulmonary edema - encourage incentive spirometry - weaned off o2 at discharge  4. Bipolar disorder-continue Lexapro and Lamictal  5. Urinary tract infection-urine cultures with multiple species. Currently on Rocephin - finished. Stopped at discharge  6. Hypertension-Imdur Added nadolol    POA and friend updated at bedside Stable and ambulated with PT prior to discharge- home health will be set up    DISCHARGE CONDITIONS:   Guarded  CONSULTS OBTAINED:   Treatment Team:  Efrain Sella, MD  DRUG ALLERGIES:   Allergies  Allergen Reactions  . Propofol Other (See Comments)    Reaction: unknown suspect allergy   DISCHARGE MEDICATIONS:   Allergies as of 03/10/2018      Reactions   Propofol Other (See Comments)   Reaction: unknown suspect allergy      Medication List    STOP taking these medications   aspirin 81 MG tablet   losartan 50 MG tablet Commonly known as:  COZAAR   simvastatin 40 MG tablet Commonly known as:  ZOCOR     TAKE these medications   albuterol 108 (90 Base) MCG/ACT inhaler Commonly known as:  PROVENTIL HFA;VENTOLIN HFA Inhale 2 puffs into the lungs every 6 (six) hours as needed for wheezing or shortness of breath.   donepezil 10 MG tablet Commonly known as:  ARICEPT Take 10 mg by mouth at bedtime.   escitalopram 10 MG tablet Commonly known as:  LEXAPRO Take 1  tablet (10 mg total) by mouth daily.   ferrous sulfate 325 (65 FE) MG tablet Take 325 mg by mouth every evening.   isosorbide mononitrate 30 MG 24 hr tablet Commonly known as:  IMDUR TAKE 1 TABLET BY MOUTH ONCE DAILY   lamoTRIgine 25 MG tablet Commonly known as:  LAMICTAL Take 3 tablets (75 mg total) by mouth daily.   levothyroxine 75 MCG tablet Commonly known as:  SYNTHROID, LEVOTHROID TAKE 1 TABLET BY MOUTH ONCE A DAY   mesalamine 1.2 g EC tablet Commonly known as:  LIALDA TAKE 2 TABLETS BY MOUTH ONCE A DAY WITH BREAKFAST   multivitamin with  minerals tablet Take 1 tablet by mouth daily.   nadolol 20 MG tablet Commonly known as:  CORGARD Take 1 tablet (20 mg total) by mouth daily.   NITROSTAT 0.4 MG SL tablet Generic drug:  nitroGLYCERIN Place 0.4 mg under the tongue every 5 (five) minutes as needed for chest pain.   pantoprazole 40 MG tablet Commonly known as:  PROTONIX Take 1 tablet (40 mg total) by mouth 2 (two) times daily. What changed:  when to take this        DISCHARGE INSTRUCTIONS:   1. GI follow up in 2 weeks 2. PCP f/u in 1 week- CBC check in 1 week  DIET:   Cardiac diet  ACTIVITY:   Activity as tolerated  OXYGEN:   Home Oxygen: No.  Oxygen Delivery: room air  DISCHARGE LOCATION:   home   If you experience worsening of your admission symptoms, develop shortness of breath, life threatening emergency, suicidal or homicidal thoughts you must seek medical attention immediately by calling 911 or calling your MD immediately  if symptoms less severe.  You Must read complete instructions/literature along with all the possible adverse reactions/side effects for all the Medicines you take and that have been prescribed to you. Take any new Medicines after you have completely understood and accpet all the possible adverse reactions/side effects.   Please note  You were cared for by a hospitalist during your hospital stay. If you have any questions about your discharge medications or the care you received while you were in the hospital after you are discharged, you can call the unit and asked to speak with the hospitalist on call if the hospitalist that took care of you is not available. Once you are discharged, your primary care physician will handle any further medical issues. Please note that NO REFILLS for any discharge medications will be authorized once you are discharged, as it is imperative that you return to your primary care physician (or establish a relationship with a primary care physician if you  do not have one) for your aftercare needs so that they can reassess your need for medications and monitor your lab values.    On the day of Discharge:  VITAL SIGNS:   Blood pressure (!) 150/58, pulse 67, temperature 98.5 F (36.9 C), temperature source Oral, resp. rate 16, height 5' 2"  (1.575 m), weight 60.5 kg (133 lb 4.8 oz), SpO2 92 %.  PHYSICAL EXAMINATION:    GENERAL:  72 y.o.-year-old patient lying in the bed with no acute distress.  EYES: Pupils equal, round, reactive to light and accommodation. No scleral icterus. Extraocular muscles intact.  HEENT: Head atraumatic, normocephalic. Oropharynx and nasopharynx clear.  NECK:  Supple, no jugular venous distention. No thyroid enlargement, no tenderness.  LUNGS: Normal breath sounds bilaterally, no wheezing, rales,rhonchi or crepitation. No use of accessory muscles of respiration.  decreased bibasilar breath sounds CARDIOVASCULAR: S1, S2 normal. No murmurs, rubs, or gallops.  ABDOMEN: Soft, nontender but some tenderness in epigastric region, nondistended. Bowel sounds present. No organomegaly or mass.  EXTREMITIES: No pedal edema, cyanosis, or clubbing.  NEUROLOGIC: Cranial nerves II through XII are intact. Muscle strength 5/5 in all extremities. Sensation intact. Gait not checked. Global weakness noted PSYCHIATRIC: The patient is alert and oriented x 3.  SKIN: No obvious rash, lesion, or ulcer.  DATA REVIEW:   CBC Recent Labs  Lab 03/10/18 0356  WBC 5.7  HGB 7.1*  HCT 21.4*  PLT 68*    Chemistries  Recent Labs  Lab 03/06/18 1906  03/08/18 0552 03/09/18 0415  NA 141   < > 141 142  K 3.7   < > 4.1 3.7  CL 111   < > 114* 113*  CO2 23   < > 23 26  GLUCOSE 124*   < > 113* 120*  BUN 24*   < > 16 13  CREATININE 0.69   < > 0.62 0.63  CALCIUM 8.1*   < > 7.6* 7.7*  MG  --   --  1.8  --   AST 30  --   --   --   ALT 10*  --   --   --   ALKPHOS 77  --   --   --   BILITOT 1.4*  --   --   --    < > = values in this interval  not displayed.     Microbiology Results  Results for orders placed or performed during the hospital encounter of 03/06/18  Urine Culture     Status: Abnormal   Collection Time: 03/07/18 12:59 AM  Result Value Ref Range Status   Specimen Description   Final    URINE, RANDOM Performed at [facility], [address] [place], [place] [address]    Special Requests   Final    NONE Performed at [facility], [address] [place], [place] [address]    Culture MULTIPLE SPECIES PRESENT, SUGGEST RECOLLECTION (A)  Final   Report Status 03/08/2018 FINAL  Final  MRSA PCR Screening     Status: None   Collection Time: 03/07/18  6:30 PM  Result Value Ref Range Status   MRSA by PCR NEGATIVE NEGATIVE Final    Comment:        The GeneXpert MRSA Assay (FDA approved for NASAL specimens only), is one component of a comprehensive MRSA colonization surveillance program. It is not intended to diagnose MRSA infection nor to guide or monitor treatment for MRSA infections. Performed at [facility], [address], [place], [place] [address]     RADIOLOGY:  No results found.   Management plans discussed with the patient, family and they are in agreement.  CODE STATUS:  Code Status History    Date Active Date Inactive Code Status Order ID Comments User Context   03/07/2018 0040 03/10/2018 2329 Full Code [id number]  Lance Coon, MD Inpatient      TOTAL TIME TAKING CARE OF THIS PATIENT: 38 minutes.    Waver Dibiasio M.D on 03/11/2018 at 3:00 PM  Between 7am to 6pm - Pager - [phone]  After 6pm go to [email or web address] - Proofreader  [facility]  Office  [phone]  CC: Primary care physician; Birdie Sons, MD   Note: This dictation was prepared with Dragon dictation along with smaller phrase technology. Any transcriptional  errors that result from this process are unintentional.

## 2018-03-11 NOTE — Care Management (Signed)
Late Entry: Orders received after 5 pm for home health. Spoke with daughter,Sue, by phone. Offered home health agencies. Referral to [facility] for RN and PT. She will go to [facility] and pick up a walker.

## 2018-03-11 NOTE — Telephone Encounter (Signed)
Transition Care Management Follow-Up Telephone Call   Date discharged and where: [facility] on 03/10/18.  How have you been since you were released from the hospital? Pt is still weak and stomach is swollen. Pt is sore all over but no pain other than her throat (due to the endoscopy). BMs are dark colored due to current medications. Pt has seen some blood in the stool but not a lot. Pt is to monitor this if increased. Denies n/v/fever.   Any patient concerns? New medications and if the change is ok. Pt was told to drop her cholesterol and a BP med.  Items Reviewed:   Meds: verified  Allergies: verified  Dietary Changes Reviewed: low sodium heart healthy  Functional Questionnaire:  Independent-I Dependent-D  ADLs:   Dressing- I    Eating- I   Maintaining continence-   Transferring- I   Transportation- D   Meal Prep- I   Managing Meds- I  Confirmed importance and Date/Time of follow-up visits scheduled: scheduled follow up for 03/13/18 @ 11:20 AM -ok per PCP's assistant.    Confirmed with patient if condition worsens to call PCP or go to the Emergency Dept. Patient was given office number and encouraged to call back with questions or concerns: YES

## 2018-03-12 ENCOUNTER — Telehealth: Payer: Self-pay | Admitting: Family Medicine

## 2018-03-12 DIAGNOSIS — E785 Hyperlipidemia, unspecified: Secondary | ICD-10-CM | POA: Diagnosis not present

## 2018-03-12 DIAGNOSIS — I10 Essential (primary) hypertension: Secondary | ICD-10-CM | POA: Diagnosis not present

## 2018-03-12 DIAGNOSIS — K7460 Unspecified cirrhosis of liver: Secondary | ICD-10-CM | POA: Diagnosis not present

## 2018-03-12 DIAGNOSIS — Z87891 Personal history of nicotine dependence: Secondary | ICD-10-CM | POA: Diagnosis not present

## 2018-03-12 DIAGNOSIS — I739 Peripheral vascular disease, unspecified: Secondary | ICD-10-CM | POA: Diagnosis not present

## 2018-03-12 DIAGNOSIS — Z85850 Personal history of malignant neoplasm of thyroid: Secondary | ICD-10-CM | POA: Diagnosis not present

## 2018-03-12 DIAGNOSIS — I2510 Atherosclerotic heart disease of native coronary artery without angina pectoris: Secondary | ICD-10-CM | POA: Diagnosis not present

## 2018-03-12 DIAGNOSIS — K219 Gastro-esophageal reflux disease without esophagitis: Secondary | ICD-10-CM | POA: Diagnosis not present

## 2018-03-12 DIAGNOSIS — I8501 Esophageal varices with bleeding: Secondary | ICD-10-CM | POA: Diagnosis not present

## 2018-03-12 DIAGNOSIS — K766 Portal hypertension: Secondary | ICD-10-CM | POA: Diagnosis not present

## 2018-03-12 NOTE — Telephone Encounter (Signed)
Please advise 

## 2018-03-12 NOTE — Telephone Encounter (Signed)
That's fine

## 2018-03-12 NOTE — Telephone Encounter (Signed)
Amy with [facility] needs approval for plan of care for:  1 time the first week, 2 times for a week and then  1 time for 2 weeks.

## 2018-03-13 ENCOUNTER — Inpatient Hospital Stay: Payer: Self-pay | Admitting: Family Medicine

## 2018-03-13 ENCOUNTER — Encounter: Payer: Self-pay | Admitting: Family Medicine

## 2018-03-13 ENCOUNTER — Ambulatory Visit (INDEPENDENT_AMBULATORY_CARE_PROVIDER_SITE_OTHER): Payer: Medicare Other | Admitting: Family Medicine

## 2018-03-13 VITALS — BP 140/40 | HR 58 | Temp 98.1°F | Resp 16 | Wt 130.0 lb

## 2018-03-13 DIAGNOSIS — R188 Other ascites: Secondary | ICD-10-CM | POA: Diagnosis not present

## 2018-03-13 DIAGNOSIS — I952 Hypotension due to drugs: Secondary | ICD-10-CM

## 2018-03-13 DIAGNOSIS — I8501 Esophageal varices with bleeding: Secondary | ICD-10-CM | POA: Diagnosis not present

## 2018-03-13 DIAGNOSIS — I8500 Esophageal varices without bleeding: Secondary | ICD-10-CM | POA: Insufficient documentation

## 2018-03-13 DIAGNOSIS — D61818 Other pancytopenia: Secondary | ICD-10-CM

## 2018-03-13 DIAGNOSIS — I10 Essential (primary) hypertension: Secondary | ICD-10-CM

## 2018-03-13 DIAGNOSIS — K7460 Unspecified cirrhosis of liver: Secondary | ICD-10-CM

## 2018-03-13 DIAGNOSIS — I2510 Atherosclerotic heart disease of native coronary artery without angina pectoris: Secondary | ICD-10-CM

## 2018-03-13 DIAGNOSIS — K7689 Other specified diseases of liver: Secondary | ICD-10-CM | POA: Diagnosis not present

## 2018-03-13 DIAGNOSIS — J439 Emphysema, unspecified: Secondary | ICD-10-CM | POA: Diagnosis not present

## 2018-03-13 DIAGNOSIS — R0602 Shortness of breath: Secondary | ICD-10-CM | POA: Diagnosis not present

## 2018-03-13 MED ORDER — MESALAMINE 1.2 G PO TBEC: 1.2000 g | DELAYED_RELEASE_TABLET | Freq: Every day | ORAL | 0 refills | Status: AC

## 2018-03-13 MED ORDER — ISOSORBIDE MONONITRATE ER 30 MG PO TB24: 15.0000 mg | ORAL_TABLET | Freq: Every day | ORAL | 0 refills | Status: DC

## 2018-03-13 MED ORDER — SPIRONOLACTONE 25 MG PO TABS: 25.0000 mg | ORAL_TABLET | Freq: Every day | ORAL | 1 refills | Status: DC

## 2018-03-13 MED ORDER — LAMOTRIGINE 25 MG PO TABS: 25.0000 mg | ORAL_TABLET | Freq: Every day | ORAL | 0 refills | Status: AC

## 2018-03-13 NOTE — Patient Instructions (Addendum)
   PLEASE BRING ALL OF YOUR MEDICATION BOTTLES TO EVERY APPOINTMENT TO MAKE SURE OUR MEDICATION LIST IS THE SAME AS YOURS    Meds changed this encounter  Medications  . isosorbide mononitrate (IMDUR) 30 MG 24 hr tablet    Sig: Take 0.5 tablets (15 mg total) by mouth daily.  Marland Kitchen lamoTRIgine (LAMICTAL) 25 MG tablet    Sig: Take 1 tablet (25 mg total) by mouth daily.    Dispense:  3 tablet  . mesalamine (LIALDA) 1.2 g EC tablet    Sig: Take 1 tablet (1.2 g total) by mouth daily with breakfast.     Can start spironolactone when her diastolic BP gets over 70

## 2018-03-13 NOTE — Progress Notes (Signed)
Patient: Margaret Romero Female    DOB: 06-Oct-1946   72 y.o.   MRN: [id number] Visit Date: 03/13/2018  Today's Provider: Lelon Huh, MD   No chief complaint on file.  Subjective:    HPI  Follow up Hospitalization  Patient was admitted to [facility] on 03/06/2018 and discharged on 03/10/2018. She was treated for upper GI bleed, hemate mesis secondary to esphogageal varices and cirrhosis of liver   patient received electrolytes for 72hrs and IV PPIs. PPI was changed to oral at discharge and patient is to have a repeat EGD in 2 weeks. She is scheduled to follow up with Dr. Alice Reichert 03/31/2018. Was also prescribed nadolol   Anemia-   Lab Results  Component Value Date   HGB 7.1 (L) 03/10/2018   HGB 7.4 (L) 03/09/2018   HGB 7.5 (L) 03/09/2018   HGB 8.7 (L) 03/08/2018   HGB 8.3 (L) 03/07/2018   patient was given 1 unit PRBCs  prior to discharge. Patient was advised to start oral iron supplements at discharge which she has done.   Telephone follow up was done on 03/11/2018.  She reports good compliance with treatment. She reports this condition is Worse. Since her discharge she has had nor more vomiting or bleeding, but has been increasingly fatigued and lethargic. Her BP has been very low and caregiver has not given her nadolol yesterday or today due to diastolic BP being in the 22W.         Allergies  Allergen Reactions  . Propofol Other (See Comments)    Reaction: unknown suspect allergy     Current Outpatient Medications:  .  albuterol (PROVENTIL HFA;VENTOLIN HFA) 108 (90 Base) MCG/ACT inhaler, Inhale 2 puffs into the lungs every 6 (six) hours as needed for wheezing or shortness of breath., Disp: , Rfl:  .  donepezil (ARICEPT) 10 MG tablet, Take 10 mg by mouth at bedtime., Disp: , Rfl:  .  escitalopram (LEXAPRO) 10 MG tablet, Take 1 tablet (10 mg total) by mouth daily., Disp: 90 tablet, Rfl: 4 .  ferrous sulfate 325 (65 FE) MG tablet, Take 325 mg by mouth every evening.,  Disp: , Rfl:  .  isosorbide mononitrate (IMDUR) 30 MG 24 hr tablet, TAKE 1 TABLET BY MOUTH ONCE DAILY, Disp: 90 tablet, Rfl: 4 .  lamoTRIgine (LAMICTAL) 25 MG tablet, Take 3 tablets (75 mg total) by mouth daily., Disp: 270 tablet, Rfl: 2 .  levothyroxine (SYNTHROID, LEVOTHROID) 75 MCG tablet, TAKE 1 TABLET BY MOUTH ONCE A DAY, Disp: 90 tablet, Rfl: 3 .  mesalamine (LIALDA) 1.2 g EC tablet, TAKE 2 TABLETS BY MOUTH ONCE A DAY WITH BREAKFAST, Disp: 60 tablet, Rfl: 5 .  Multiple Vitamins-Minerals (MULTIVITAMIN WITH MINERALS) tablet, Take 1 tablet by mouth daily., Disp: , Rfl:  .  nadolol (CORGARD) 20 MG tablet, Take 1 tablet (20 mg total) by mouth daily., Disp: 30 tablet, Rfl: 2 .  nitroGLYCERIN (NITROSTAT) 0.4 MG SL tablet, Place 0.4 mg under the tongue every 5 (five) minutes as needed for chest pain. , Disp: , Rfl:  .  pantoprazole (PROTONIX) 40 MG tablet, Take 1 tablet (40 mg total) by mouth 2 (two) times daily., Disp: 60 tablet, Rfl: 2  Review of Systems  Constitutional: Positive for fatigue. Negative for chills and fever.  HENT: Negative for congestion, rhinorrhea and sinus pressure.   Respiratory: Negative for chest tightness, shortness of breath and wheezing.   Cardiovascular: Negative for palpitations and leg swelling.  Gastrointestinal: Positive for abdominal distention and abdominal pain. Negative for blood in stool, constipation and diarrhea.    Social History   Tobacco Use  . Smoking status: Former Smoker    Packs/day: 1.00    Years: 10.00    Pack years: 10.00    Last attempt to quit: 12/01/1974    Years since quitting: 43.3  . Smokeless tobacco: Never Used  Substance Use Topics  . Alcohol use: No   Objective:   BP (!) 140/40 (BP Location: Left Arm, Patient Position: Sitting, Cuff Size: Normal)   Pulse (!) 58   Temp 98.1 F (36.7 C)   Resp 16   Wt 130 lb (59 kg)   SpO2 94%   BMI 23.78 kg/m     Physical Exam   General Appearance:    Awake, cooperative, no  distress, somnolent at times.   Eyes:    PERRL, conjunctiva/corneas clear, EOM's intact       Lungs:     Clear to auscultation bilaterally, respirations unlabored  Heart:    Regular rate and rhythm  Neurologic:   Awake, alert, oriented x 3. No apparent focal neurological           defect.   GI:   Moderate distention of abdomen, slightly  tender to palpations. No discrete masses.        Assessment & Plan:      1. Bleeding esophageal varices, unspecified esophageal varices type (HCC) No sign of active bleeding. Has missed a few days of nadolol due to low blood pressures. Considering that she has not had any ischemic cardiac conditions, will reduce isosorbide to 1/2 tablet a day and start back on nadolol.   2. Cirrhosis of liver with ascites, unspecified hepatic cirrhosis type (HCC)  - CBC - Comprehensive metabolic panel - Ammonia  Rx for spironolactone to start if her systolics get over 159 and her diastolic come up.   3. Hypotension Reduce isosorbide as above. Family reports losartan was discontinued upon discharge. Should then be able to start back on nadolol  4. Pancytopenia (HCC)  - CBC - Comprehensive metabolic panel - Ammonia  5. Nodular hyperplasia of liver   6. Somnolence Checking ammonia as above. Likely partly due to reduced hepatic metabolism of her psychiatric medications. Reduce lamotrigine and mesalamine to once daily for the time being.   Discussed option of paracentesis. Advised to go to ER if she patient has worsening abdominal pain or distention before GI follow up.       Lelon Huh, MD  [facility]

## 2018-03-13 NOTE — Telephone Encounter (Signed)
Amy advised.

## 2018-03-14 LAB — COMPREHENSIVE METABOLIC PANEL
A/G RATIO: 1.6 (ref 1.2–2.2)
ALT: 12 IU/L (ref 0–32)
AST: 35 IU/L (ref 0–40)
Albumin: 3.1 g/dL — ABNORMAL LOW (ref 3.5–4.8)
Alkaline Phosphatase: 115 IU/L (ref 39–117)
BUN/Creatinine Ratio: 13 (ref 12–28)
BUN: 9 mg/dL (ref 8–27)
Bilirubin Total: 0.8 mg/dL (ref 0.0–1.2)
CALCIUM: 7.7 mg/dL — AB (ref 8.7–10.3)
CO2: 25 mmol/L (ref 20–29)
Chloride: 106 mmol/L (ref 96–106)
Creatinine, Ser: 0.71 mg/dL (ref 0.57–1.00)
GFR, EST AFRICAN AMERICAN: 98 mL/min/{1.73_m2} (ref >59)
GFR, EST NON AFRICAN AMERICAN: 85 mL/min/{1.73_m2} (ref >59)
Globulin, Total: 2 g/dL (ref 1.5–4.5)
Glucose: 85 mg/dL (ref 65–99)
Potassium: 3.5 mmol/L (ref 3.5–5.2)
Sodium: 143 mmol/L (ref 134–144)
TOTAL PROTEIN: 5.1 g/dL — AB (ref 6.0–8.5)

## 2018-03-14 LAB — CBC
Hematocrit: 27.3 % — ABNORMAL LOW (ref 34.0–46.6)
Hemoglobin: 8.9 g/dL — ABNORMAL LOW (ref 11.1–15.9)
MCH: 31.4 pg (ref 26.6–33.0)
MCHC: 32.6 g/dL (ref 31.5–35.7)
MCV: 97 fL (ref 79–97)
PLATELETS: 94 10*3/uL — AB (ref 150–379)
RBC: 2.83 x10E6/uL — AB (ref 3.77–5.28)
RDW: 17.9 % — AB (ref 12.3–15.4)
WBC: 6.3 10*3/uL (ref 3.4–10.8)

## 2018-03-14 LAB — AMMONIA: Ammonia: 44 ug/dL (ref 19–87)

## 2018-03-16 ENCOUNTER — Telehealth: Payer: Self-pay | Admitting: Emergency Medicine

## 2018-03-16 ENCOUNTER — Telehealth: Payer: Self-pay | Admitting: Family Medicine

## 2018-03-16 ENCOUNTER — Telehealth: Payer: Self-pay | Admitting: *Deleted

## 2018-03-16 DIAGNOSIS — E785 Hyperlipidemia, unspecified: Secondary | ICD-10-CM | POA: Diagnosis not present

## 2018-03-16 DIAGNOSIS — I8501 Esophageal varices with bleeding: Secondary | ICD-10-CM | POA: Diagnosis not present

## 2018-03-16 DIAGNOSIS — I739 Peripheral vascular disease, unspecified: Secondary | ICD-10-CM | POA: Diagnosis not present

## 2018-03-16 DIAGNOSIS — Z85850 Personal history of malignant neoplasm of thyroid: Secondary | ICD-10-CM | POA: Diagnosis not present

## 2018-03-16 DIAGNOSIS — K219 Gastro-esophageal reflux disease without esophagitis: Secondary | ICD-10-CM | POA: Diagnosis not present

## 2018-03-16 DIAGNOSIS — I2510 Atherosclerotic heart disease of native coronary artery without angina pectoris: Secondary | ICD-10-CM | POA: Diagnosis not present

## 2018-03-16 DIAGNOSIS — I10 Essential (primary) hypertension: Secondary | ICD-10-CM | POA: Diagnosis not present

## 2018-03-16 DIAGNOSIS — Z87891 Personal history of nicotine dependence: Secondary | ICD-10-CM | POA: Diagnosis not present

## 2018-03-16 DIAGNOSIS — K7460 Unspecified cirrhosis of liver: Secondary | ICD-10-CM | POA: Diagnosis not present

## 2018-03-16 DIAGNOSIS — K766 Portal hypertension: Secondary | ICD-10-CM | POA: Diagnosis not present

## 2018-03-16 NOTE — Telephone Encounter (Signed)
-----   Message from Birdie Sons, MD sent at 03/15/2018  8:13 PM EDT ----- Blood count is much better, up from 7.1 to 8.9, liver functions are stable. Ammonia levels are normal. She can go ahead and start spironolactone that was prescribed Friday if she hasn't already

## 2018-03-16 NOTE — Telephone Encounter (Signed)
Patient has cirrhosis and worsening ascites and looks like she will need a paracentesis soon. She is not scheduled to see her GI Dr. Alice Reichert until 4-30. Can you please see if anyone at [facility] gi can see her this week. She'll probably end up being admitted if they can't see her sooner. Thanks.

## 2018-03-16 NOTE — Telephone Encounter (Signed)
Care giver called and asked if patient needs to keep CT scan Wednesday. She was recently discharged from hospital and had CXR done, not sure if she still needs CT. Please advise

## 2018-03-16 NOTE — Telephone Encounter (Signed)
Pt caregiver informed.

## 2018-03-16 NOTE — Telephone Encounter (Signed)
That's fine

## 2018-03-16 NOTE — Telephone Encounter (Signed)
Please advise. Thanks.  

## 2018-03-16 NOTE — Telephone Encounter (Signed)
Margaret Romero with [facility] is requesting verbal orders for physical therapy as follows:  Twice a week for 2 weeks Once a week for 1 week  Please advise. Thanks TNP

## 2018-03-16 NOTE — Telephone Encounter (Signed)
Margaret Romero was advised. Margaret Romero also wanted to [place] know patient's abdomen was swollen. Stacey measured pt's stomach at 60 1/2 inches.

## 2018-03-17 DIAGNOSIS — K766 Portal hypertension: Secondary | ICD-10-CM | POA: Diagnosis not present

## 2018-03-17 DIAGNOSIS — I8500 Esophageal varices without bleeding: Secondary | ICD-10-CM | POA: Diagnosis not present

## 2018-03-17 NOTE — Telephone Encounter (Signed)
[facility] office called back stating that Dr Alice Reichert will see pt today 03/17/18 at 3:30.Pt has been notified by their office

## 2018-03-17 NOTE — Telephone Encounter (Signed)
I spoke with Margaret Romero at [facility] who states message sent to Dr Alice Reichert who will not be in office until this afternoon.Dr Alice Reichert will contact Dr [name] to discuss

## 2018-03-17 NOTE — Telephone Encounter (Signed)
Left message on patient voice mail to proceed with CT as planned

## 2018-03-18 ENCOUNTER — Other Ambulatory Visit: Payer: Self-pay | Admitting: Internal Medicine

## 2018-03-18 ENCOUNTER — Telehealth: Payer: Self-pay | Admitting: Family Medicine

## 2018-03-18 ENCOUNTER — Ambulatory Visit
Admission: RE | Admit: 2018-03-18 | Discharge: 2018-03-18 | Disposition: A | Discharge status: Home / Self Care | Payer: Medicare Other | Source: Ambulatory Visit | Attending: Oncology | Admitting: Oncology

## 2018-03-18 DIAGNOSIS — I864 Gastric varices: Secondary | ICD-10-CM | POA: Insufficient documentation

## 2018-03-18 DIAGNOSIS — R162 Hepatomegaly with splenomegaly, not elsewhere classified: Secondary | ICD-10-CM | POA: Insufficient documentation

## 2018-03-18 DIAGNOSIS — I8501 Esophageal varices with bleeding: Secondary | ICD-10-CM | POA: Diagnosis not present

## 2018-03-18 DIAGNOSIS — J9811 Atelectasis: Secondary | ICD-10-CM | POA: Insufficient documentation

## 2018-03-18 DIAGNOSIS — J432 Centrilobular emphysema: Secondary | ICD-10-CM | POA: Insufficient documentation

## 2018-03-18 DIAGNOSIS — R918 Other nonspecific abnormal finding of lung field: Secondary | ICD-10-CM | POA: Diagnosis not present

## 2018-03-18 DIAGNOSIS — I8500 Esophageal varices without bleeding: Secondary | ICD-10-CM | POA: Insufficient documentation

## 2018-03-18 DIAGNOSIS — I739 Peripheral vascular disease, unspecified: Secondary | ICD-10-CM | POA: Diagnosis not present

## 2018-03-18 DIAGNOSIS — I10 Essential (primary) hypertension: Secondary | ICD-10-CM | POA: Diagnosis not present

## 2018-03-18 DIAGNOSIS — Z85850 Personal history of malignant neoplasm of thyroid: Secondary | ICD-10-CM | POA: Diagnosis not present

## 2018-03-18 DIAGNOSIS — Z87891 Personal history of nicotine dependence: Secondary | ICD-10-CM | POA: Diagnosis not present

## 2018-03-18 DIAGNOSIS — I700 Atherosclerosis of aorta: Secondary | ICD-10-CM | POA: Diagnosis not present

## 2018-03-18 DIAGNOSIS — K766 Portal hypertension: Secondary | ICD-10-CM | POA: Diagnosis not present

## 2018-03-18 DIAGNOSIS — E785 Hyperlipidemia, unspecified: Secondary | ICD-10-CM | POA: Diagnosis not present

## 2018-03-18 DIAGNOSIS — K219 Gastro-esophageal reflux disease without esophagitis: Secondary | ICD-10-CM | POA: Diagnosis not present

## 2018-03-18 DIAGNOSIS — K7031 Alcoholic cirrhosis of liver with ascites: Secondary | ICD-10-CM

## 2018-03-18 DIAGNOSIS — K7460 Unspecified cirrhosis of liver: Secondary | ICD-10-CM | POA: Insufficient documentation

## 2018-03-18 DIAGNOSIS — R188 Other ascites: Secondary | ICD-10-CM | POA: Diagnosis not present

## 2018-03-18 DIAGNOSIS — I2510 Atherosclerotic heart disease of native coronary artery without angina pectoris: Secondary | ICD-10-CM | POA: Diagnosis not present

## 2018-03-18 DIAGNOSIS — I313 Pericardial effusion (noninflammatory): Secondary | ICD-10-CM | POA: Insufficient documentation

## 2018-03-18 MED ORDER — IOHEXOL 300 MG/ML  SOLN
75.0000 mL | Freq: Once | INTRAMUSCULAR | Status: AC | PRN
  Administered 2018-03-18: 75 mL via INTRAVENOUS

## 2018-03-18 NOTE — Telephone Encounter (Signed)
[facility] called stating that you put patient on spironolactone (ALDACTONE) 25 MG tablet to reduce fluid but the directions state "do not give if her systolic BP is under 063 or if diastolic BP is under 70.  She states that her diastolic is never over 70 so she can't give it to her.  She states that her BP today was 120/48 which is about average for her.  The home health nurse advised her to call you to see what to do.  Please advise

## 2018-03-18 NOTE — Telephone Encounter (Signed)
[facility] called back saying pt is going to have some fluid drawn off at [facility] per Dr. Alice Reichert.

## 2018-03-19 DIAGNOSIS — I8501 Esophageal varices with bleeding: Secondary | ICD-10-CM | POA: Diagnosis not present

## 2018-03-19 DIAGNOSIS — K219 Gastro-esophageal reflux disease without esophagitis: Secondary | ICD-10-CM | POA: Diagnosis not present

## 2018-03-19 DIAGNOSIS — K766 Portal hypertension: Secondary | ICD-10-CM | POA: Diagnosis not present

## 2018-03-19 DIAGNOSIS — Z87891 Personal history of nicotine dependence: Secondary | ICD-10-CM | POA: Diagnosis not present

## 2018-03-19 DIAGNOSIS — I2510 Atherosclerotic heart disease of native coronary artery without angina pectoris: Secondary | ICD-10-CM | POA: Diagnosis not present

## 2018-03-19 DIAGNOSIS — I739 Peripheral vascular disease, unspecified: Secondary | ICD-10-CM | POA: Diagnosis not present

## 2018-03-19 DIAGNOSIS — K7460 Unspecified cirrhosis of liver: Secondary | ICD-10-CM | POA: Diagnosis not present

## 2018-03-19 DIAGNOSIS — E785 Hyperlipidemia, unspecified: Secondary | ICD-10-CM | POA: Diagnosis not present

## 2018-03-19 DIAGNOSIS — I10 Essential (primary) hypertension: Secondary | ICD-10-CM | POA: Diagnosis not present

## 2018-03-19 DIAGNOSIS — Z85850 Personal history of malignant neoplasm of thyroid: Secondary | ICD-10-CM | POA: Diagnosis not present

## 2018-03-20 ENCOUNTER — Ambulatory Visit
Admission: RE | Admit: 2018-03-20 | Discharge: 2018-03-20 | Disposition: A | Discharge status: Home / Self Care | Payer: Medicare Other | Source: Ambulatory Visit | Attending: Internal Medicine | Admitting: Internal Medicine

## 2018-03-20 DIAGNOSIS — R918 Other nonspecific abnormal finding of lung field: Secondary | ICD-10-CM | POA: Diagnosis not present

## 2018-03-20 DIAGNOSIS — R160 Hepatomegaly, not elsewhere classified: Secondary | ICD-10-CM | POA: Insufficient documentation

## 2018-03-20 DIAGNOSIS — R188 Other ascites: Secondary | ICD-10-CM | POA: Diagnosis not present

## 2018-03-20 DIAGNOSIS — K7031 Alcoholic cirrhosis of liver with ascites: Secondary | ICD-10-CM

## 2018-03-20 DIAGNOSIS — Z85850 Personal history of malignant neoplasm of thyroid: Secondary | ICD-10-CM | POA: Diagnosis not present

## 2018-03-20 LAB — LACTATE DEHYDROGENASE, PLEURAL OR PERITONEAL FLUID: LD, Fluid: 48 U/L — ABNORMAL HIGH (ref 3–23)

## 2018-03-20 LAB — BODY FLUID CELL COUNT WITH DIFFERENTIAL
EOS FL: 0 %
LYMPHS FL: 72 %
Monocyte-Macrophage-Serous Fluid: 13 %
NEUTROPHIL FLUID: 15 %
OTHER CELLS FL: 0 %
Total Nucleated Cell Count, Fluid: 590 cu mm

## 2018-03-20 LAB — ALBUMIN, PLEURAL OR PERITONEAL FLUID

## 2018-03-20 LAB — PROTEIN, PLEURAL OR PERITONEAL FLUID: Total protein, fluid: <3 g/dL

## 2018-03-20 NOTE — Procedures (Signed)
PROCEDURE SUMMARY:  Successful US guided paracentesis from RLQ.  Yielded 1.2 L of clear yellow fluid.  No immediate complications.  Pt tolerated well.   Specimen was sent for labs.  Ascencion Dike PA-C 03/20/2018 9:04 AM

## 2018-03-21 LAB — PROTEIN, BODY FLUID (OTHER): Total Protein, Body Fluid Other: 1.6 g/dL

## 2018-03-23 DIAGNOSIS — I2510 Atherosclerotic heart disease of native coronary artery without angina pectoris: Secondary | ICD-10-CM | POA: Diagnosis not present

## 2018-03-23 DIAGNOSIS — I10 Essential (primary) hypertension: Secondary | ICD-10-CM | POA: Diagnosis not present

## 2018-03-23 DIAGNOSIS — I8501 Esophageal varices with bleeding: Secondary | ICD-10-CM | POA: Diagnosis not present

## 2018-03-23 DIAGNOSIS — Z85850 Personal history of malignant neoplasm of thyroid: Secondary | ICD-10-CM | POA: Diagnosis not present

## 2018-03-23 DIAGNOSIS — Z87891 Personal history of nicotine dependence: Secondary | ICD-10-CM | POA: Diagnosis not present

## 2018-03-23 DIAGNOSIS — K766 Portal hypertension: Secondary | ICD-10-CM | POA: Diagnosis not present

## 2018-03-23 DIAGNOSIS — K7460 Unspecified cirrhosis of liver: Secondary | ICD-10-CM | POA: Diagnosis not present

## 2018-03-23 DIAGNOSIS — I739 Peripheral vascular disease, unspecified: Secondary | ICD-10-CM | POA: Diagnosis not present

## 2018-03-23 DIAGNOSIS — E785 Hyperlipidemia, unspecified: Secondary | ICD-10-CM | POA: Diagnosis not present

## 2018-03-23 DIAGNOSIS — K219 Gastro-esophageal reflux disease without esophagitis: Secondary | ICD-10-CM | POA: Diagnosis not present

## 2018-03-23 LAB — CYTOLOGY - NON PAP

## 2018-03-23 LAB — BODY FLUID CULTURE: CULTURE: NO GROWTH

## 2018-03-24 ENCOUNTER — Ambulatory Visit
Admission: RE | Admit: 2018-03-24 | Discharge: 2018-03-24 | Disposition: A | Discharge status: Home / Self Care | Payer: Medicare Other | Source: Ambulatory Visit | Attending: Oncology | Admitting: Oncology

## 2018-03-24 DIAGNOSIS — K7460 Unspecified cirrhosis of liver: Secondary | ICD-10-CM | POA: Diagnosis not present

## 2018-03-24 DIAGNOSIS — K7031 Alcoholic cirrhosis of liver with ascites: Secondary | ICD-10-CM | POA: Diagnosis present

## 2018-03-24 DIAGNOSIS — R161 Splenomegaly, not elsewhere classified: Secondary | ICD-10-CM | POA: Insufficient documentation

## 2018-03-24 DIAGNOSIS — I700 Atherosclerosis of aorta: Secondary | ICD-10-CM | POA: Insufficient documentation

## 2018-03-24 MED ORDER — GADOBENATE DIMEGLUMINE 529 MG/ML IV SOLN
10.0000 mL | Freq: Once | INTRAVENOUS | Status: AC | PRN
  Administered 2018-03-24: 10 mL via INTRAVENOUS

## 2018-03-25 ENCOUNTER — Telehealth: Payer: Self-pay | Admitting: *Deleted

## 2018-03-25 NOTE — Telephone Encounter (Signed)
GI office at [facility] called to let you know that they saw the MRI results concerning about liver and wanted to let you know that they are happy to assist if you need GI asst. Just call them and let them know.

## 2018-03-26 ENCOUNTER — Telehealth: Payer: Self-pay | Admitting: *Deleted

## 2018-03-26 DIAGNOSIS — I739 Peripheral vascular disease, unspecified: Secondary | ICD-10-CM | POA: Diagnosis not present

## 2018-03-26 DIAGNOSIS — I8501 Esophageal varices with bleeding: Secondary | ICD-10-CM | POA: Diagnosis not present

## 2018-03-26 DIAGNOSIS — I2510 Atherosclerotic heart disease of native coronary artery without angina pectoris: Secondary | ICD-10-CM | POA: Diagnosis not present

## 2018-03-26 DIAGNOSIS — K7460 Unspecified cirrhosis of liver: Secondary | ICD-10-CM | POA: Diagnosis not present

## 2018-03-26 DIAGNOSIS — E785 Hyperlipidemia, unspecified: Secondary | ICD-10-CM | POA: Diagnosis not present

## 2018-03-26 DIAGNOSIS — I10 Essential (primary) hypertension: Secondary | ICD-10-CM | POA: Diagnosis not present

## 2018-03-26 DIAGNOSIS — Z87891 Personal history of nicotine dependence: Secondary | ICD-10-CM | POA: Diagnosis not present

## 2018-03-26 DIAGNOSIS — Z85850 Personal history of malignant neoplasm of thyroid: Secondary | ICD-10-CM | POA: Diagnosis not present

## 2018-03-26 DIAGNOSIS — K219 Gastro-esophageal reflux disease without esophagitis: Secondary | ICD-10-CM | POA: Diagnosis not present

## 2018-03-26 DIAGNOSIS — K766 Portal hypertension: Secondary | ICD-10-CM | POA: Diagnosis not present

## 2018-03-26 NOTE — Telephone Encounter (Signed)
Margaret Romero, can you move patient's appt up to next week to discuss about MRI results. Thanks.

## 2018-03-26 NOTE — Telephone Encounter (Signed)
Alina from [facility] called office for verbal orders for social worker to visit in home, to help patient with outside resources. Please advise?

## 2018-03-27 DIAGNOSIS — K219 Gastro-esophageal reflux disease without esophagitis: Secondary | ICD-10-CM | POA: Diagnosis not present

## 2018-03-27 DIAGNOSIS — Z85850 Personal history of malignant neoplasm of thyroid: Secondary | ICD-10-CM | POA: Diagnosis not present

## 2018-03-27 DIAGNOSIS — I10 Essential (primary) hypertension: Secondary | ICD-10-CM | POA: Diagnosis not present

## 2018-03-27 DIAGNOSIS — E785 Hyperlipidemia, unspecified: Secondary | ICD-10-CM | POA: Diagnosis not present

## 2018-03-27 DIAGNOSIS — I739 Peripheral vascular disease, unspecified: Secondary | ICD-10-CM | POA: Diagnosis not present

## 2018-03-27 DIAGNOSIS — K7460 Unspecified cirrhosis of liver: Secondary | ICD-10-CM | POA: Diagnosis not present

## 2018-03-27 DIAGNOSIS — I8501 Esophageal varices with bleeding: Secondary | ICD-10-CM | POA: Diagnosis not present

## 2018-03-27 DIAGNOSIS — K766 Portal hypertension: Secondary | ICD-10-CM | POA: Diagnosis not present

## 2018-03-27 DIAGNOSIS — I2510 Atherosclerotic heart disease of native coronary artery without angina pectoris: Secondary | ICD-10-CM | POA: Diagnosis not present

## 2018-03-27 DIAGNOSIS — Z87891 Personal history of nicotine dependence: Secondary | ICD-10-CM | POA: Diagnosis not present

## 2018-03-27 NOTE — Telephone Encounter (Signed)
ok 

## 2018-03-30 ENCOUNTER — Inpatient Hospital Stay: Payer: Medicare Other | Attending: Oncology

## 2018-03-30 ENCOUNTER — Encounter: Payer: Self-pay | Admitting: Oncology

## 2018-03-30 ENCOUNTER — Inpatient Hospital Stay (HOSPITAL_BASED_OUTPATIENT_CLINIC_OR_DEPARTMENT_OTHER): Payer: Medicare Other | Admitting: Oncology

## 2018-03-30 ENCOUNTER — Other Ambulatory Visit: Payer: Self-pay

## 2018-03-30 VITALS — BP 122/55 | HR 64 | Temp 96.7°F | Resp 18 | Wt 133.3 lb

## 2018-03-30 DIAGNOSIS — K7030 Alcoholic cirrhosis of liver without ascites: Secondary | ICD-10-CM | POA: Insufficient documentation

## 2018-03-30 DIAGNOSIS — D696 Thrombocytopenia, unspecified: Secondary | ICD-10-CM | POA: Insufficient documentation

## 2018-03-30 DIAGNOSIS — Z85850 Personal history of malignant neoplasm of thyroid: Secondary | ICD-10-CM | POA: Insufficient documentation

## 2018-03-30 DIAGNOSIS — D509 Iron deficiency anemia, unspecified: Secondary | ICD-10-CM | POA: Insufficient documentation

## 2018-03-30 DIAGNOSIS — F0390 Unspecified dementia without behavioral disturbance: Secondary | ICD-10-CM | POA: Diagnosis not present

## 2018-03-30 DIAGNOSIS — D689 Coagulation defect, unspecified: Secondary | ICD-10-CM | POA: Diagnosis not present

## 2018-03-30 DIAGNOSIS — K769 Liver disease, unspecified: Secondary | ICD-10-CM

## 2018-03-30 DIAGNOSIS — R918 Other nonspecific abnormal finding of lung field: Secondary | ICD-10-CM

## 2018-03-30 DIAGNOSIS — K7031 Alcoholic cirrhosis of liver with ascites: Secondary | ICD-10-CM

## 2018-03-30 LAB — CBC WITH DIFFERENTIAL/PLATELET
BASOS ABS: 0 10*3/uL (ref 0–0.1)
BASOS PCT: 1 %
EOS ABS: 0.1 10*3/uL (ref 0–0.7)
EOS PCT: 2 %
HCT: 26.5 % — ABNORMAL LOW (ref 35.0–47.0)
Hemoglobin: 8.8 g/dL — ABNORMAL LOW (ref 12.0–16.0)
LYMPHS PCT: 23 %
Lymphs Abs: 0.6 10*3/uL — ABNORMAL LOW (ref 1.0–3.6)
MCH: 31.2 pg (ref 26.0–34.0)
MCHC: 33.2 g/dL (ref 32.0–36.0)
MCV: 94 fL (ref 80.0–100.0)
Monocytes Absolute: 0.2 10*3/uL (ref 0.2–0.9)
Monocytes Relative: 9 %
Neutro Abs: 1.8 10*3/uL (ref 1.4–6.5)
Neutrophils Relative %: 65 %
PLATELETS: 87 10*3/uL — AB (ref 150–440)
RBC: 2.82 MIL/uL — ABNORMAL LOW (ref 3.80–5.20)
RDW: 17 % — AB (ref 11.5–14.5)
WBC: 2.8 10*3/uL — AB (ref 3.6–11.0)

## 2018-03-30 LAB — IRON AND TIBC
IRON: 18 ug/dL — AB (ref 28–170)
SATURATION RATIOS: 6 % — AB (ref 10.4–31.8)
TIBC: 281 ug/dL (ref 250–450)
UIBC: 263 ug/dL

## 2018-03-30 LAB — FERRITIN: Ferritin: 15 ng/mL (ref 11–307)

## 2018-03-30 MED ORDER — FERROUS SULFATE 325 (65 FE) MG PO TABS: 325.0000 mg | ORAL_TABLET | Freq: Two times a day (BID) | ORAL | 3 refills | Status: DC

## 2018-03-30 MED ORDER — DOCUSATE SODIUM 100 MG PO CAPS: 100.0000 mg | ORAL_CAPSULE | Freq: Every day | ORAL | 3 refills | Status: DC

## 2018-03-30 NOTE — Progress Notes (Signed)
Hematology/Oncology  Follow up note [facility] [phone] [phone]   Patient Care Team: Fisher, Donald E, MD as PCP - General (Family Medicine) Byrnett, Jeffrey W, MD (General Surgery) Schnier, Gregory G, MD (Vascular Surgery) Faheem, Uzma, MD as Referring Physician (Psychiatry) Paraschos, Alexander, MD as Consulting Physician (Cardiology) Shah, Hemang K, MD as Consulting Physician (Neurology) Benitez-Graham, Ana M, MD as Consulting Physician (Dermatology) Rhode, Hayley, RN as Registered Nurse   REASON FOR FOLLOW UP  Follow up management of lung nodules, liver lesion, and history of Thyroid cancer.   HISTORY OF PRESENTING ILLNESS:  Margaret Romero is a  71 y.o.  female with PMH listed below who was referred to me for evaluation of lung nodule, liver lesion and history of thyroid cancer.  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 Most recently in August 2018 patient had a right inguinal mass that was evaluated by CT scan which also noted and incidentally pulmonary nodule. Dedicated chest CT was subsequently performed on 08/06/2017 which confirmed a 6 mm right middle lobe pulmonary nodule that was new from previous imaging in 2016. Follow-up CT scan completed on 11/28/2017 revealed multiple pulmonary nodules scattered throughout both lungs on you since 2016 and all slightly increasing size from the most recent CT that was done in September 2018. Patient also was noted to have a new intermediate left liver lobe lesion concerning for metastatic disease.  patient had a PET scan on 12/12/2017 which showed non hypermetabolic activity in liver lesion and lung nodules.  Patient is accompanied by her caregiver and a daughter to the clinic today. Patient has dementia so most history was obtained from caregiver. Patient reports feeling well.  she is a former smoker and a former heavy drinker. Denies any cough, chest pain, abdominal  pain. She is a good appetite. Denies any recent weight loss.  # Patient's case was discussed on tumor board on December 25, 2017.   with a tumor board panel decision that given patient's multiple comorbidity, continuing watchful waiting is a reasonable option.  Plan repeat CT of chest and MRI abdomen in 3 months.   INTERVAL HISTORY Margaret Romero is a 71 y.o. female who has above history reviewed by me today presents for follow up visit for management of lung nodules, liver lesion, and history of medullary Thyroid cancer. She had a follow up MRI abdomen.  During the interval, she was hospitalized for GI bleed.  Patient was seen and evaluated by [facility].  She is considered to have Childs-Pugh class B. She is not a liver transplant candidate.  She is status post upper endoscopy Which showed grade 3 esophageal varices with recent bleeding, banded.  And type I gastroesophageal varices, without bleeding.  She is scheduled to have colonoscopy tomorrow.  Patient also had paracentesis done cytology negative for malignancy. Today patient was accompanied by her power of attorney/friend, and her daughter to the clinic to discuss MRI results. Reports patient is feeling much better after the paracentesis.  No nausea vomiting.    Review of Systems  Constitutional: Negative for chills, diaphoresis, fever and malaise/fatigue.  HENT: Negative for congestion, ear pain, hearing loss and nosebleeds.   Eyes: Negative for blurred vision, photophobia, pain and discharge.  Respiratory: Negative for cough.   Cardiovascular: Negative for chest pain and orthopnea.  Gastrointestinal: Negative for heartburn, nausea and vomiting.       Recent GI bleed  Genitourinary: Negative for dysuria.  Musculoskeletal: Negative   for myalgias.  Skin: Negative for rash.  Neurological: Negative for dizziness, tingling, tremors, sensory change and weakness.  Endo/Heme/Allergies: Does not bruise/bleed easily.  Psychiatric/Behavioral:  Positive for memory loss. Negative for depression, hallucinations and suicidal ideas.    MEDICAL HISTORY:  Past Medical History:  Diagnosis Date  . Bipolar affective (HCC)   . Cancer (HCC)    thyroid cancer   . Carotid stenosis    carotid artery stenosis status post extended ICU hospitalization after carotid endarterectomy around 2017  . Cirrhosis (HCC)   . Closed fracture carpal bone 11/16/2015  . Dementia   . Esophageal varices (HCC)   . GERD (gastroesophageal reflux disease)   . Head injury without fracture of skull   . Hypertension   . Malignant neoplasm of thyroid gland (HCC) January 23, 2011   medullary carcinoma thyroid T2, Nx.  . Memory changes   . Thyroid cancer (HCC)     SURGICAL HISTORY: Past Surgical History:  Procedure Laterality Date  . CAROTID ENDARTERECTOMY Right November 2015   Gregory Schnier, MD  . CHOLECYSTECTOMY  2006  . COLONOSCOPY  2008  . CORONARY STENT PLACEMENT  2007  . ESOPHAGOGASTRODUODENOSCOPY (EGD) WITH PROPOFOL N/A 03/07/2018   Procedure: ESOPHAGOGASTRODUODENOSCOPY (EGD) WITH PROPOFOL;  Surgeon: Toledo, Teodoro K, MD;  Location: [facility] ENDOSCOPY;  Service: Gastroenterology;  Laterality: N/A;  . HERNIA REPAIR  2007  . NASAL SINUS SURGERY  2008  . SKIN CANCER EXCISION    . THYROIDECTOMY  2012    SOCIAL HISTORY: Social History   Socioeconomic History  . Marital status: Divorced    Spouse name: Not on file  . Number of children: 1  . Years of education: Not on file  . Highest education level: Not on file  Occupational History  . Occupation: Retired  Social Needs  . Financial resource strain: Patient refused  . Food insecurity:    Worry: Patient refused    Inability: Patient refused  . Transportation needs:    Medical: Patient refused    Non-medical: Patient refused  Tobacco Use  . Smoking status: Former Smoker    Packs/day: 1.00    Years: 10.00    Pack years: 10.00    Last attempt to quit: 12/01/1974    Years since quitting:  43.3  . Smokeless tobacco: Never Used  Substance and Sexual Activity  . Alcohol use: No  . Drug use: No  . Sexual activity: Not Currently  Lifestyle  . Physical activity:    Days per week: Patient refused    Minutes per session: Patient refused  . Stress: Patient refused  Relationships  . Social connections:    Talks on phone: Patient refused    Gets together: Patient refused    Attends religious service: Patient refused    Active member of club or organization: Patient refused    Attends meetings of clubs or organizations: Patient refused    Relationship status: Patient refused  . Intimate partner violence:    Fear of current or ex partner: Patient refused    Emotionally abused: Patient refused    Physically abused: Patient refused    Forced sexual activity: Patient refused  Other Topics Concern  . Not on file  Social History Narrative  . Not on file    FAMILY HISTORY: Family History  Problem Relation Age of Onset  . Bone cancer Sister   . Heart attack Brother   . Breast cancer Sister   . Lung cancer Sister   . Heart attack Daughter       ALLERGIES:  is allergic to propofol.  MEDICATIONS:  Current Outpatient Medications  Medication Sig Dispense Refill  . donepezil (ARICEPT) 10 MG tablet Take 10 mg by mouth at bedtime.    Marland Kitchen escitalopram (LEXAPRO) 10 MG tablet Take 1 tablet (10 mg total) by mouth daily. 90 tablet 4  . ferrous sulfate 325 (65 FE) MG tablet Take 325 mg by mouth every evening.    . isosorbide mononitrate (IMDUR) 30 MG 24 hr tablet Take 0.5 tablets (15 mg total) by mouth daily. 3 tablet 0  . lamoTRIgine (LAMICTAL) 25 MG tablet Take 1 tablet (25 mg total) by mouth daily. 3 tablet 0  . levothyroxine (SYNTHROID, LEVOTHROID) 75 MCG tablet TAKE 1 TABLET BY MOUTH ONCE A DAY 90 tablet 3  . mesalamine (LIALDA) 1.2 g EC tablet Take 1 tablet (1.2 g total) by mouth daily with breakfast. 1 tablet 0  . Multiple Vitamins-Minerals (MULTIVITAMIN WITH MINERALS) tablet  Take 1 tablet by mouth daily.    . nadolol (CORGARD) 20 MG tablet Take 1 tablet (20 mg total) by mouth daily. 30 tablet 2  . nitroGLYCERIN (NITROSTAT) 0.4 MG SL tablet Place 0.4 mg under the tongue every 5 (five) minutes as needed for chest pain.     . pantoprazole (PROTONIX) 40 MG tablet Take 1 tablet (40 mg total) by mouth 2 (two) times daily. 60 tablet 2  . spironolactone (ALDACTONE) 25 MG tablet Take 1 tablet (25 mg total) by mouth daily. Do not give if her SystolicBP is under 856 or if diastolic BP is under 70 30 tablet 1  . albuterol (PROVENTIL HFA;VENTOLIN HFA) 108 (90 Base) MCG/ACT inhaler Inhale 2 puffs into the lungs every 6 (six) hours as needed for wheezing or shortness of breath.     No current facility-administered medications for this visit.      PHYSICAL EXAMINATION: ECOG PERFORMANCE STATUS: 1 - Symptomatic but completely ambulatory Vitals:   03/30/18 0956 03/30/18 1003  BP: (!) 122/55   Pulse: 64   Resp: 18   Temp: (!) 96.7 F (35.9 C)   SpO2:  99%   Filed Weights   03/30/18 0956  Weight: 133 lb 4.8 oz (60.5 kg)    Physical Exam  Constitutional: She is well-developed, well-nourished, and in no distress. No distress.  HENT:  Head: Normocephalic and atraumatic.  Right Ear: External ear normal.  Left Ear: External ear normal.  Nose: Nose normal.  Mouth/Throat: Oropharynx is clear and moist. No oropharyngeal exudate.  Eyes: Pupils are equal, round, and reactive to light. Conjunctivae are normal. Left eye exhibits no discharge. No scleral icterus.  Neck: Normal range of motion. Neck supple. No JVD present. No tracheal deviation present. No thyromegaly present.  Cardiovascular: Normal rate and regular rhythm. Exam reveals no gallop and no friction rub.  No murmur heard. Pulmonary/Chest: Effort normal and breath sounds normal. No respiratory distress. She has no wheezes.  Abdominal: Soft. Bowel sounds are normal. She exhibits distension. There is no tenderness.    Musculoskeletal: Normal range of motion. She exhibits no edema or tenderness.  Neurological: She is alert. No cranial nerve deficit. Coordination normal.  Demented  Skin: Skin is warm and dry. No rash noted. She is not diaphoretic. No erythema.  Psychiatric: Affect and judgment normal.     LABORATORY DATA:  I have reviewed the data as listed Lab Results  Component Value Date   WBC 6.3 03/13/2018   HGB 8.9 (L) 03/13/2018   HCT 27.3 (L) 03/13/2018   MCV  97 03/13/2018   PLT 94 (LL) 03/13/2018   Recent Labs    12/15/17 1127 03/06/18 1906  03/08/18 0552 03/09/18 0415 03/13/18 1224  NA 142 141   < > 141 142 143  K 3.8 3.7   < > 4.1 3.7 3.5  CL 108 111   < > 114* 113* 106  CO2 26 23   < > 23 26 25  GLUCOSE 114* 124*   < > 113* 120* 85  BUN 11 24*   < > 16 13 9  CREATININE 0.64 0.69   < > 0.62 0.63 0.71  CALCIUM 8.4* 8.1*   < > 7.6* 7.7* 7.7*  GFRNONAA >60 >60   < > >60 >60 85  GFRAA >60 >60   < > >60 >60 98  PROT 6.3* 5.4*  --   --   --  5.1*  ALBUMIN 3.4* 3.0*  --   --   --  3.1*  AST 39 30  --   --   --  35  ALT 16 10*  --   --   --  12  ALKPHOS 109 77  --   --   --  115  BILITOT 1.0 1.4*  --   --   --  0.8   < > = values in this interval not displayed.   RADIOGRAPHIC STUDIES: I have personally reviewed the radiological images as listed and agreed with the findings in the report. 12/12/2017 PET scan show no definite hypermetabolic met metastatic disease. Patient has scattered solid nodule in both lungs, largest 7 mm in the right middle lobe, all below pets resolution and not associated with significant metabolic. Slow growing pulmonary metastasis remaining on the differential. Continue CT chest surveillance recommended in 3 months. Nonspecific mild bilateral hilar hypermetabolic without discrete enlarged hilar nodes on the noncontrast CT images. These findings could be reactive for neoplastic. Follow-up CT in 3 months. Hepatic cirrhosis, small low attention lesion in  segment 2 of the left liver lobe is hypermetabolic and appears new since 2016 mri abdomen. further evaluation with mri abdomen without and with contrast is recommended at this time to exclude hepatic cellular carcinoma.  Sequela of portal hypertension including prominent splenomegaly and a small volume ascites. stable small pericardial effusion and thickening. aortic atherosclerosis, coronary atherosclerosis.   12/18/2017 MRI abdomen Multiple indeterminate hepatic lesions are demonstrated with restricted diffusion and early enhancement following contrast. No significant T2 hyperintensity or washout period these lesions appeared new compared with the 2016 MRI. Multifocal hepatocellular carcinoma unlikely given AFP normal. Multifocal FNH could have this appearance, although also less likely given non-visibility on previous studies. Percutaneous biopsy could be attempted if these lesions can be identified on ultrasound. Otherwise stable sequela of cirrhosis and portal hypertension with splenomegaly, multiple stable splenic lesions. Mild ascites.  03/24/2018 MRI abdomen.  1. Multiple foci of restricted diffusion and indistinct arterial phase enhancement in the liver. Lesions measure up to 1.8 cm, without definite capsule or washout appearance. Although several lesions appear to have enlarged, they do not meet the criteria for threshold growth. Assessment somewhat problematic by due to motion artifact but given the apparent mild increase in size of several of these lesions, multifocal hepatocellular carcinoma is the favored diagnosis and could be confirmed by biopsy. 2. Cirrhosis with marked splenomegaly and scattered stable T2 hyperintense splenic lesions. 3. Increased ascites. 4. Malrotation of the small bowel. 5. 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 6.  Aortic Atherosclerosis (ICD10-I70.0).     ASSESSMENT & PLAN:  1. Liver lesion   2. Alcoholic cirrhosis of liver without ascites  ([place])   3. Iron deficiency anemia, unspecified iron deficiency anemia type   4. Coagulopathy ([place])   5. Dementia without behavioral disturbance, unspecified dementia type   6. Thrombocytopenia ([place])   7. 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  # Multiple liver lesion, normal AFP, differential diagnosis includes metastatic thyroid disease vs multi focal HCC.  Image was independently reviewed by me and discussed with patient.  Liver biopsy is the next step to find out the etiology of her liver lesion, however given her advanced cirrhosis and coagulopathy, liver biopsy is high risk for bleeding.  Discussed with patient, her power of attorney and daughter,  it is a very high risk for liver biopsy. If the lesion turns out to be metastatic sites of her Medullary thyroid disease, most likely she will be watched.  If this is [place], prognosis is extremely poor and patient may not tolerate chemotherapy.  Currently advanced decompensated liver cirrhosis most likely going to be the issue which poses immediate threaten to her life. Power of attorney and daughter feel that conservatory measures are best for patient. They are concerned about the high bleeding risk of liver biopsy.  Discussed about referral of palliative care and discuss about goal of care, hospice if they are interested,.  Will send referral.   # Liver cirrhosis: see above.  # Thrombocytopenia: trending down.  # Iron deficiency anemia: likely blood loss from GI bleeding. On oral iron supplementation. Plan IV iron with Venofer 266m weekly for 4 doses. Allergy reactions/infusion reaction including anaphylactic reaction discussed with patient. Patient's POA  voices understanding and willing to proceed.  # History of Medullary Thyroid Cancer: discussed with POA over the phone that I recommend patient or her first degree relatives to have genetic  testing for RET.  Medullary Thyroid cancer can be associated with genetic syndromes can 25% is hereditary. POA voices understanding and will relay to patient and daughter. If patient is interested, will arrange her to get tested.   All questions were answered. The patient and care giver know to call the clinic with any problems questions or concerns. Total face to face encounter time for this patient visit was 45 min. >50% of the time was  spent in counseling and coordination of care.   Return of visit:  3 months.    ZEarlie Server MD, PhD Hematology Oncology CBuffalo Psychiatric Centerat AMemorial [facility] Center - AshlandPager- [phone]/29/2019

## 2018-03-30 NOTE — Progress Notes (Signed)
Patient here for follow up. Is scheduled for endoscopy tomorrow.

## 2018-03-30 NOTE — Telephone Encounter (Signed)
Alina advised.   Thanks,   -Mickel Baas

## 2018-03-31 ENCOUNTER — Encounter
Admission: RE | Disposition: A | Discharge status: Home / Self Care | Payer: Self-pay | Source: Ambulatory Visit | Attending: Internal Medicine

## 2018-03-31 ENCOUNTER — Observation Stay
Admission: RE | Admit: 2018-03-31 | Discharge: 2018-04-02 | Disposition: A | Discharge status: Home / Self Care | Payer: Medicare Other | Source: Ambulatory Visit | Attending: Internal Medicine | Admitting: Internal Medicine

## 2018-03-31 ENCOUNTER — Other Ambulatory Visit: Payer: Self-pay

## 2018-03-31 ENCOUNTER — Encounter: Payer: Self-pay | Admitting: *Deleted

## 2018-03-31 ENCOUNTER — Other Ambulatory Visit: Payer: Medicare Other

## 2018-03-31 ENCOUNTER — Observation Stay: Payer: Medicare Other

## 2018-03-31 ENCOUNTER — Ambulatory Visit: Payer: Medicare Other | Admitting: Certified Registered Nurse Anesthetist

## 2018-03-31 DIAGNOSIS — K3189 Other diseases of stomach and duodenum: Secondary | ICD-10-CM | POA: Diagnosis not present

## 2018-03-31 DIAGNOSIS — K9161 Intraoperative hemorrhage and hematoma of a digestive system organ or structure complicating a digestive sytem procedure: Secondary | ICD-10-CM | POA: Diagnosis not present

## 2018-03-31 DIAGNOSIS — M40204 Unspecified kyphosis, thoracic region: Secondary | ICD-10-CM | POA: Insufficient documentation

## 2018-03-31 DIAGNOSIS — F319 Bipolar disorder, unspecified: Secondary | ICD-10-CM | POA: Insufficient documentation

## 2018-03-31 DIAGNOSIS — F0390 Unspecified dementia without behavioral disturbance: Secondary | ICD-10-CM | POA: Insufficient documentation

## 2018-03-31 DIAGNOSIS — I8511 Secondary esophageal varices with bleeding: Secondary | ICD-10-CM | POA: Diagnosis not present

## 2018-03-31 DIAGNOSIS — J9811 Atelectasis: Secondary | ICD-10-CM | POA: Diagnosis not present

## 2018-03-31 DIAGNOSIS — Y838 Other surgical procedures as the cause of abnormal reaction of the patient, or of later complication, without mention of misadventure at the time of the procedure: Secondary | ICD-10-CM | POA: Diagnosis not present

## 2018-03-31 DIAGNOSIS — Z884 Allergy status to anesthetic agent status: Secondary | ICD-10-CM | POA: Diagnosis not present

## 2018-03-31 DIAGNOSIS — J9691 Respiratory failure, unspecified with hypoxia: Secondary | ICD-10-CM | POA: Diagnosis not present

## 2018-03-31 DIAGNOSIS — K7030 Alcoholic cirrhosis of liver without ascites: Secondary | ICD-10-CM | POA: Insufficient documentation

## 2018-03-31 DIAGNOSIS — Z8719 Personal history of other diseases of the digestive system: Secondary | ICD-10-CM | POA: Diagnosis not present

## 2018-03-31 DIAGNOSIS — R918 Other nonspecific abnormal finding of lung field: Secondary | ICD-10-CM | POA: Diagnosis not present

## 2018-03-31 DIAGNOSIS — I864 Gastric varices: Secondary | ICD-10-CM | POA: Insufficient documentation

## 2018-03-31 DIAGNOSIS — I10 Essential (primary) hypertension: Secondary | ICD-10-CM | POA: Diagnosis not present

## 2018-03-31 DIAGNOSIS — J90 Pleural effusion, not elsewhere classified: Secondary | ICD-10-CM | POA: Insufficient documentation

## 2018-03-31 DIAGNOSIS — K766 Portal hypertension: Secondary | ICD-10-CM | POA: Insufficient documentation

## 2018-03-31 DIAGNOSIS — Z955 Presence of coronary angioplasty implant and graft: Secondary | ICD-10-CM | POA: Diagnosis not present

## 2018-03-31 DIAGNOSIS — I2510 Atherosclerotic heart disease of native coronary artery without angina pectoris: Secondary | ICD-10-CM | POA: Diagnosis not present

## 2018-03-31 DIAGNOSIS — I8500 Esophageal varices without bleeding: Secondary | ICD-10-CM | POA: Diagnosis not present

## 2018-03-31 DIAGNOSIS — C787 Secondary malignant neoplasm of liver and intrahepatic bile duct: Secondary | ICD-10-CM | POA: Diagnosis not present

## 2018-03-31 DIAGNOSIS — E039 Hypothyroidism, unspecified: Secondary | ICD-10-CM | POA: Diagnosis not present

## 2018-03-31 DIAGNOSIS — R0902 Hypoxemia: Secondary | ICD-10-CM

## 2018-03-31 DIAGNOSIS — K7460 Unspecified cirrhosis of liver: Secondary | ICD-10-CM | POA: Diagnosis not present

## 2018-03-31 DIAGNOSIS — R188 Other ascites: Secondary | ICD-10-CM

## 2018-03-31 DIAGNOSIS — Z79890 Hormone replacement therapy (postmenopausal): Secondary | ICD-10-CM | POA: Diagnosis not present

## 2018-03-31 DIAGNOSIS — Z85850 Personal history of malignant neoplasm of thyroid: Secondary | ICD-10-CM | POA: Insufficient documentation

## 2018-03-31 DIAGNOSIS — Z79899 Other long term (current) drug therapy: Secondary | ICD-10-CM | POA: Diagnosis not present

## 2018-03-31 DIAGNOSIS — K219 Gastro-esophageal reflux disease without esophagitis: Secondary | ICD-10-CM | POA: Insufficient documentation

## 2018-03-31 DIAGNOSIS — K922 Gastrointestinal hemorrhage, unspecified: Secondary | ICD-10-CM | POA: Diagnosis present

## 2018-03-31 DIAGNOSIS — I6529 Occlusion and stenosis of unspecified carotid artery: Secondary | ICD-10-CM | POA: Diagnosis not present

## 2018-03-31 DIAGNOSIS — Z87891 Personal history of nicotine dependence: Secondary | ICD-10-CM | POA: Diagnosis not present

## 2018-03-31 HISTORY — PX: ESOPHAGOGASTRODUODENOSCOPY (EGD) WITH PROPOFOL: SHX5813

## 2018-03-31 LAB — CBC
HEMATOCRIT: 26.6 % — AB (ref 35.0–47.0)
Hemoglobin: 8.4 g/dL — ABNORMAL LOW (ref 12.0–16.0)
MCH: 29.9 pg (ref 26.0–34.0)
MCHC: 31.7 g/dL — ABNORMAL LOW (ref 32.0–36.0)
MCV: 94.3 fL (ref 80.0–100.0)
PLATELETS: 82 10*3/uL — AB (ref 150–440)
RBC: 2.82 MIL/uL — ABNORMAL LOW (ref 3.80–5.20)
RDW: 16.8 % — ABNORMAL HIGH (ref 11.5–14.5)
WBC: 2 10*3/uL — AB (ref 3.6–11.0)

## 2018-03-31 LAB — HEMOGLOBIN: HEMOGLOBIN: 8.8 g/dL — AB (ref 12.0–16.0)

## 2018-03-31 LAB — BASIC METABOLIC PANEL
ANION GAP: 4 — AB (ref 5–15)
BUN: 9 mg/dL (ref 6–20)
CALCIUM: 7.9 mg/dL — AB (ref 8.9–10.3)
CO2: 27 mmol/L (ref 22–32)
CREATININE: 0.73 mg/dL (ref 0.44–1.00)
Chloride: 110 mmol/L (ref 101–111)
Glucose, Bld: 105 mg/dL — ABNORMAL HIGH (ref 65–99)
Potassium: 4.1 mmol/L (ref 3.5–5.1)
SODIUM: 141 mmol/L (ref 135–145)

## 2018-03-31 LAB — CEA: CEA: 58.8 ng/mL — ABNORMAL HIGH (ref 0.0–4.7)

## 2018-03-31 LAB — CALCITONIN: CALCITONIN: 354 pg/mL — AB (ref 0.0–5.0)

## 2018-03-31 LAB — AFP TUMOR MARKER: AFP, SERUM, TUMOR MARKER: 5.4 ng/mL (ref 0.0–8.3)

## 2018-03-31 SURGERY — ESOPHAGOGASTRODUODENOSCOPY (EGD) WITH PROPOFOL
Anesthesia: General

## 2018-03-31 MED ORDER — ALBUTEROL SULFATE (2.5 MG/3ML) 0.083% IN NEBU: 2.5000 mg | INHALATION_SOLUTION | Freq: Four times a day (QID) | RESPIRATORY_TRACT | Status: DC | PRN

## 2018-03-31 MED ORDER — ONDANSETRON HCL 4 MG PO TABS: 4.0000 mg | ORAL_TABLET | Freq: Four times a day (QID) | ORAL | Status: DC | PRN

## 2018-03-31 MED ORDER — PANTOPRAZOLE SODIUM 40 MG PO TBEC
40.0000 mg | DELAYED_RELEASE_TABLET | Freq: Two times a day (BID) | ORAL | Status: DC
  Administered 2018-03-31 – 2018-04-02 (×4): 40 mg via ORAL
  Filled 2018-03-31 (×5): qty 1

## 2018-03-31 MED ORDER — LEVOTHYROXINE SODIUM 75 MCG PO TABS
75.0000 ug | ORAL_TABLET | Freq: Every day | ORAL | Status: DC
  Administered 2018-04-01 – 2018-04-02 (×2): 75 ug via ORAL
  Filled 2018-03-31: qty 3
  Filled 2018-03-31 (×2): qty 1

## 2018-03-31 MED ORDER — ALBUTEROL SULFATE HFA 108 (90 BASE) MCG/ACT IN AERS: 2.0000 | INHALATION_SPRAY | Freq: Four times a day (QID) | RESPIRATORY_TRACT | Status: DC | PRN

## 2018-03-31 MED ORDER — MESALAMINE 1.2 G PO TBEC
1.2000 g | DELAYED_RELEASE_TABLET | Freq: Every day | ORAL | Status: DC
  Administered 2018-04-01 – 2018-04-02 (×2): 1.2 g via ORAL
  Filled 2018-03-31 (×2): qty 1

## 2018-03-31 MED ORDER — ESMOLOL HCL 100 MG/10ML IV SOLN
INTRAVENOUS | Status: DC | PRN
  Administered 2018-03-31: 20 mg via INTRAVENOUS

## 2018-03-31 MED ORDER — ESCITALOPRAM OXALATE 10 MG PO TABS
10.0000 mg | ORAL_TABLET | Freq: Every day | ORAL | Status: DC
  Administered 2018-03-31 – 2018-04-02 (×3): 10 mg via ORAL
  Filled 2018-03-31 (×3): qty 1

## 2018-03-31 MED ORDER — ONDANSETRON HCL 4 MG/2ML IJ SOLN: 4.0000 mg | Freq: Four times a day (QID) | INTRAMUSCULAR | Status: DC | PRN

## 2018-03-31 MED ORDER — LIDOCAINE HCL (CARDIAC) PF 100 MG/5ML IV SOSY
PREFILLED_SYRINGE | INTRAVENOUS | Status: DC | PRN
  Administered 2018-03-31: 60 mg via INTRAVENOUS

## 2018-03-31 MED ORDER — PROPOFOL 10 MG/ML IV BOLUS
INTRAVENOUS | Status: AC
  Filled 2018-03-31: qty 20

## 2018-03-31 MED ORDER — PROPOFOL 500 MG/50ML IV EMUL
INTRAVENOUS | Status: DC | PRN
  Administered 2018-03-31: 140 ug/kg/min via INTRAVENOUS

## 2018-03-31 MED ORDER — PROPOFOL 10 MG/ML IV BOLUS
INTRAVENOUS | Status: DC | PRN
  Administered 2018-03-31: 40 mg via INTRAVENOUS

## 2018-03-31 MED ORDER — PHENYLEPHRINE HCL 10 MG/ML IJ SOLN
INTRAMUSCULAR | Status: DC | PRN
  Administered 2018-03-31 (×2): 50 ug via INTRAVENOUS

## 2018-03-31 MED ORDER — ACETAMINOPHEN 650 MG RE SUPP: 650.0000 mg | Freq: Four times a day (QID) | RECTAL | Status: DC | PRN

## 2018-03-31 MED ORDER — SODIUM CHLORIDE 0.9 % IV SOLN
50.0000 ug/h | INTRAVENOUS | Status: DC
  Administered 2018-03-31 – 2018-04-01 (×4): 50 ug/h via INTRAVENOUS
  Filled 2018-03-31 (×11): qty 1

## 2018-03-31 MED ORDER — SODIUM CHLORIDE 0.9 % IV SOLN
INTRAVENOUS | Status: DC
  Administered 2018-03-31: 1000 mL via INTRAVENOUS

## 2018-03-31 MED ORDER — DONEPEZIL HCL 5 MG PO TABS
10.0000 mg | ORAL_TABLET | Freq: Every day | ORAL | Status: DC
  Administered 2018-03-31 – 2018-04-01 (×2): 10 mg via ORAL
  Filled 2018-03-31 (×3): qty 2

## 2018-03-31 MED ORDER — LAMOTRIGINE 25 MG PO TABS
25.0000 mg | ORAL_TABLET | Freq: Every day | ORAL | Status: DC
  Administered 2018-03-31 – 2018-04-02 (×3): 25 mg via ORAL
  Filled 2018-03-31 (×3): qty 1

## 2018-03-31 MED ORDER — OCTREOTIDE LOAD VIA INFUSION
100.0000 ug | Freq: Once | INTRAVENOUS | Status: AC
  Administered 2018-03-31: 100 ug via INTRAVENOUS
  Filled 2018-03-31: qty 50

## 2018-03-31 MED ORDER — GLYCOPYRROLATE 0.2 MG/ML IJ SOLN
INTRAMUSCULAR | Status: DC | PRN
  Administered 2018-03-31 (×2): 0.1 mg via INTRAVENOUS

## 2018-03-31 MED ORDER — DEXAMETHASONE SODIUM PHOSPHATE 10 MG/ML IJ SOLN
INTRAMUSCULAR | Status: DC | PRN
  Administered 2018-03-31: 5 mg via INTRAVENOUS

## 2018-03-31 MED ORDER — ACETAMINOPHEN 325 MG PO TABS
650.0000 mg | ORAL_TABLET | Freq: Four times a day (QID) | ORAL | Status: DC | PRN
  Administered 2018-04-01: 14:00:00 650 mg via ORAL
  Filled 2018-03-31: qty 2

## 2018-03-31 MED ORDER — NADOLOL 20 MG PO TABS
20.0000 mg | ORAL_TABLET | Freq: Every day | ORAL | Status: DC
  Administered 2018-03-31 – 2018-04-02 (×3): 20 mg via ORAL
  Filled 2018-03-31 (×3): qty 1

## 2018-03-31 MED ORDER — ADULT MULTIVITAMIN W/MINERALS CH
1.0000 | ORAL_TABLET | Freq: Every day | ORAL | Status: DC
  Administered 2018-03-31 – 2018-04-02 (×3): 1 via ORAL
  Filled 2018-03-31 (×3): qty 1

## 2018-03-31 MED ORDER — DEXAMETHASONE SODIUM PHOSPHATE 10 MG/ML IJ SOLN
INTRAMUSCULAR | Status: AC
  Filled 2018-03-31: qty 1

## 2018-03-31 MED ORDER — ONDANSETRON HCL 4 MG/2ML IJ SOLN
INTRAMUSCULAR | Status: DC | PRN
  Administered 2018-03-31: 4 mg via INTRAVENOUS

## 2018-03-31 MED ORDER — SUCCINYLCHOLINE CHLORIDE 20 MG/ML IJ SOLN
INTRAMUSCULAR | Status: DC | PRN
  Administered 2018-03-31: 80 mg via INTRAVENOUS

## 2018-03-31 MED ORDER — HYDRALAZINE HCL 20 MG/ML IJ SOLN
5.0000 mg | Freq: Four times a day (QID) | INTRAMUSCULAR | Status: DC | PRN
  Administered 2018-03-31: 5 mg via INTRAVENOUS

## 2018-03-31 NOTE — H&P (Signed)
Outpatient short stay form Pre-procedure 03/31/2018 10:27 AM Valeska Haislip K. Alice Reichert, M.D.  Primary Physician: Lelon Huh, M.D.  Reason for visit:  Esophageal varices, Cirrhosis, Portal hypertension.  History of present illness: Patient is a 72 year old female status post recent hospitalization for gastrointestinal bleeding.  She underwent a EGD with variceal band ligation on 03/07/2018 with 3 bands were placed.  It is now listed the patient is being "allergic to propofol".  There is been no significant recurrent bleeding since the patient's hospitalization and she presents today for repeat EGD and variceal band ligation with hopeful obliteration of varices for an extended period.    Current Facility-Administered Medications:  .  0.9 %  sodium chloride infusion, , Intravenous, Continuous, [place], Benay Pike, MD, Last Rate: 20 mL/hr at 03/31/18 1009, 1,000 mL at 03/31/18 1009  Medications Prior to Admission  Medication Sig Dispense Refill Last Dose  . albuterol (PROVENTIL HFA;VENTOLIN HFA) 108 (90 Base) MCG/ACT inhaler Inhale 2 puffs into the lungs every 6 (six) hours as needed for wheezing or shortness of breath.   03/30/2018 at Unknown time  . donepezil (ARICEPT) 10 MG tablet Take 10 mg by mouth at bedtime.   Past Week at Unknown time  . escitalopram (LEXAPRO) 10 MG tablet Take 1 tablet (10 mg total) by mouth daily. 90 tablet 4 03/30/2018 at Unknown time  . ferrous sulfate 325 (65 FE) MG tablet Take 1 tablet (325 mg total) by mouth 2 (two) times daily with a meal. 60 tablet 3 Past Week at Unknown time  . isosorbide mononitrate (IMDUR) 30 MG 24 hr tablet Take 0.5 tablets (15 mg total) by mouth daily. 3 tablet 0 03/30/2018 at Unknown time  . lamoTRIgine (LAMICTAL) 25 MG tablet Take 1 tablet (25 mg total) by mouth daily. 3 tablet 0 03/30/2018 at Unknown time  . levothyroxine (SYNTHROID, LEVOTHROID) 75 MCG tablet TAKE 1 TABLET BY MOUTH ONCE A DAY 90 tablet 3 03/30/2018 at Unknown time  . mesalamine (LIALDA)  1.2 g EC tablet Take 1 tablet (1.2 g total) by mouth daily with breakfast. 1 tablet 0 03/30/2018 at Unknown time  . nadolol (CORGARD) 20 MG tablet Take 1 tablet (20 mg total) by mouth daily. 30 tablet 2 03/30/2018 at Unknown time  . pantoprazole (PROTONIX) 40 MG tablet Take 1 tablet (40 mg total) by mouth 2 (two) times daily. 60 tablet 2 03/30/2018 at Unknown time  . docusate sodium (COLACE) 100 MG capsule Take 1 capsule (100 mg total) by mouth daily. Do not take if you have loose stools (Patient not taking: Reported on 03/31/2018) 30 capsule 3 Completed Course at Unknown time  . Multiple Vitamins-Minerals (MULTIVITAMIN WITH MINERALS) tablet Take 1 tablet by mouth daily.   Taking  . nitroGLYCERIN (NITROSTAT) 0.4 MG SL tablet Place 0.4 mg under the tongue every 5 (five) minutes as needed for chest pain.    Taking  . spironolactone (ALDACTONE) 25 MG tablet Take 1 tablet (25 mg total) by mouth daily. Do not give if her SystolicBP is under 768 or if diastolic BP is under 70 (Patient not taking: Reported on 03/31/2018) 30 tablet 1 Not Taking at Unknown time     Allergies  Allergen Reactions  . Propofol Other (See Comments)    Reaction: unknown suspect allergy     Past Medical History:  Diagnosis Date  . Bipolar affective ([place])   . Cancer [facility])    thyroid cancer   . Carotid stenosis    carotid artery stenosis status post extended ICU  hospitalization after carotid endarterectomy around 2017  . Cirrhosis ([place])   . Closed fracture carpal bone 11/16/2015  . Dementia   . Esophageal varices ([place])   . GERD (gastroesophageal reflux disease)   . Head injury without fracture of skull   . Hypertension   . Malignant neoplasm of thyroid gland [facility]) January 23, 2011   medullary carcinoma thyroid T2, Nx.  . Memory changes   . Thyroid cancer ([place])     Review of systems:      Physical Exam  Gen: Alert, oriented. Appears stated age.  HEENT: [place]/AT. PERRLA. Lungs: CTA, no wheezes. CV: RR nl S1,  S2. Abd: soft, benign, no masses. BS+ Ext: No edema. Pulses 2+    Planned procedures: EGD with possible variceal band ligation/hemostasis/biopsy. The patient's guardian (Mrs. Risa Grill) understands the nature of the planned procedure, indications, risks, alternatives and potential complications including but not limited to bleeding, infection, perforation, damage to internal organs and possible oversedation/side effects from anesthesia. The patient and guardian agrees and gives consent to proceed.  Please refer to procedure notes for findings, recommendations and patient disposition/instructions.    Kearsten Ginther K. Alice Reichert, M.D. Gastroenterology 03/31/2018  10:27 AM

## 2018-03-31 NOTE — Plan of Care (Signed)
Vss. No s/s coughing or spitting blood. Remains npo x sips with meds Problem: Education: Goal: Ability to identify signs and symptoms of gastrointestinal bleeding will improve Outcome: Progressing   Problem: Bowel/Gastric: Goal: Will show no signs and symptoms of gastrointestinal bleeding Outcome: Progressing   Problem: Fluid Volume: Goal: Will show no signs and symptoms of excessive bleeding Outcome: Progressing   Problem: Clinical Measurements: Goal: Complications related to the disease process, condition or treatment will be avoided or minimized Outcome: Progressing   Problem: Education: Goal: Knowledge of General Education information will improve Outcome: Progressing   Problem: Safety: Goal: Ability to remain free from injury will improve Outcome: Progressing

## 2018-03-31 NOTE — Anesthesia Procedure Notes (Signed)
Procedure Name: Intubation Performed by: Demetrius Charity, CRNA Pre-anesthesia Checklist: Patient identified, Patient being monitored, Timeout performed, Emergency Drugs available and Suction available Patient Re-evaluated:Patient Re-evaluated prior to induction Oxygen Delivery Method: Circle system utilized Preoxygenation: Pre-oxygenation with 100% oxygen Induction Type: IV induction, Cricoid Pressure applied and Rapid sequence Laryngoscope Size: 3 and McGraph Grade View: Grade I Tube type: Oral Tube size: 7.0 mm Number of attempts: 1 Airway Equipment and Method: Stylet Placement Confirmation: ETT inserted through vocal cords under direct vision,  positive ETCO2 and breath sounds checked- equal and bilateral Secured at: 21 cm Tube secured with: Tape Dental Injury: Teeth and Oropharynx as per pre-operative assessment

## 2018-03-31 NOTE — H&P (Signed)
[facility] NAME: Margaret Romero    MR#:  [id number]  DATE OF BIRTH:  06-03-1946  DATE OF ADMISSION:  03/31/2018  PRIMARY CARE PHYSICIAN: Birdie Sons, MD   REQUESTING/REFERRING PHYSICIAN: Dr. Olean Ree  CHIEF COMPLAINT:  No chief complaint on file.   HISTORY OF PRESENT ILLNESS:  Margaret Romero  is a 72 y.o. 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 During the procedure for obliteration of her varices, she started to have some bleeding, requiring hemo sprays, blood pressure dropped and she was intubated.  She is currently extubated to 2 L nasal cannula.  Complains of dry mouth.  Have not had any recent bleeding as outpatient.  Has some dark stools due to being on iron supplements.  Blood pressure is improved now.  Not requiring any pressors.  Patient is awake and alert and at baseline.  She is being admitted for observation.  PAST MEDICAL HISTORY:   Past Medical History:  Diagnosis Date  . Bipolar affective ([place])   . Cancer [facility])    thyroid cancer   . Carotid stenosis    carotid artery stenosis status post extended ICU hospitalization after carotid endarterectomy around 2017  . Cirrhosis ([place])   . Closed fracture carpal bone 11/16/2015  . Dementia   . Esophageal varices ([place])   . GERD (gastroesophageal reflux disease)   . Head injury without fracture of skull   . Hypertension   . Malignant neoplasm of thyroid gland [facility]) January 23, 2011   medullary carcinoma thyroid T2, Nx.  . Memory changes   . Thyroid cancer ([place])     PAST SURGICAL HISTORY:   Past Surgical History:  Procedure Laterality Date  . CAROTID  ENDARTERECTOMY Right November 2015   [name], MD  . [name]  2006  . COLONOSCOPY  2008  . CORONARY STENT PLACEMENT  2007  . ESOPHAGOGASTRODUODENOSCOPY (EGD) WITH PROPOFOL N/A 03/07/2018   Procedure: ESOPHAGOGASTRODUODENOSCOPY (EGD) WITH PROPOFOL;  Surgeon: Toledo, Benay Pike, MD;  Location: [facility] ENDOSCOPY;  Service: Gastroenterology;  Laterality: N/A;  . HERNIA REPAIR  2007  . NASAL SINUS SURGERY  2008  . SKIN CANCER EXCISION    . THYROIDECTOMY  2012    SOCIAL HISTORY:   Social History   Tobacco Use  . Smoking status: Former Smoker    Packs/day: 1.00    Years: 10.00    Pack years: 10.00    Last attempt to quit: 12/01/1974    Years since quitting: 43.3  . Smokeless tobacco: Never Used  Substance Use Topics  . Alcohol use: No    FAMILY HISTORY:   Family History  Problem Relation Age of Onset  . Bone cancer Sister   . Heart attack Brother   . Breast cancer Sister   . Lung cancer Sister   . Heart attack Daughter     DRUG ALLERGIES:   Allergies  Allergen Reactions  . Propofol Other (See Comments)    Reaction: unknown suspect allergy    REVIEW OF SYSTEMS:   Review of Systems  Constitutional: Positive for malaise/fatigue and weight loss. Negative for chills and fever.  HENT: Negative for ear discharge, ear pain, hearing loss and nosebleeds.   Eyes: Negative  for blurred vision, double vision and photophobia.  Respiratory: Positive for shortness of breath. Negative for cough, hemoptysis and wheezing.   Cardiovascular: Negative for chest pain, palpitations, orthopnea and leg swelling.  Gastrointestinal: Positive for abdominal pain and melena. Negative for constipation, diarrhea, heartburn, nausea and vomiting.  Genitourinary: Negative for dysuria, frequency, hematuria and urgency.  Musculoskeletal: Negative for back pain, myalgias and neck pain.  Skin: Negative for rash.  Neurological: Negative for dizziness, tingling, sensory change, speech change,  focal weakness and headaches.  Endo/Heme/Allergies: Does not bruise/bleed easily.  Psychiatric/Behavioral: Negative for depression.    MEDICATIONS AT HOME:   Prior to Admission medications   Medication Sig Start Date End Date Taking? Authorizing Provider  albuterol (PROVENTIL HFA;VENTOLIN HFA) 108 (90 Base) MCG/ACT inhaler Inhale 2 puffs into the lungs every 6 (six) hours as needed for wheezing or shortness of breath.   Yes [provider]  donepezil (ARICEPT) 10 MG tablet Take 10 mg by mouth at bedtime.   Yes [provider]  escitalopram (LEXAPRO) 10 MG tablet Take 1 tablet (10 mg total) by mouth daily. 11/17/17  Yes Rainey Pines, MD  ferrous sulfate 325 (65 FE) MG tablet Take 1 tablet (325 mg total) by mouth 2 (two) times daily with a meal. 03/30/18  Yes Earlie Server, MD  isosorbide mononitrate (IMDUR) 30 MG 24 hr tablet Take 0.5 tablets (15 mg total) by mouth daily. 03/13/18  Yes Birdie Sons, MD  lamoTRIgine (LAMICTAL) 25 MG tablet Take 1 tablet (25 mg total) by mouth daily. 03/13/18  Yes Birdie Sons, MD  levothyroxine (SYNTHROID, LEVOTHROID) 75 MCG tablet TAKE 1 TABLET BY MOUTH ONCE A DAY 07/08/17  Yes Byrnett, Forest Gleason, MD  mesalamine (LIALDA) 1.2 g EC tablet Take 1 tablet (1.2 g total) by mouth daily with breakfast. 03/13/18  Yes Birdie Sons, MD  nadolol (CORGARD) 20 MG tablet Take 1 tablet (20 mg total) by mouth daily. 03/10/18  Yes Gladstone Lighter, MD  pantoprazole (PROTONIX) 40 MG tablet Take 1 tablet (40 mg total) by mouth 2 (two) times daily. 03/10/18  Yes Gladstone Lighter, MD  docusate sodium (COLACE) 100 MG capsule Take 1 capsule (100 mg total) by mouth daily. Do not take if you have loose stools Patient not taking: Reported on 03/31/2018 03/30/18   Earlie Server, MD  Multiple Vitamins-Minerals (MULTIVITAMIN WITH MINERALS) tablet Take 1 tablet by mouth daily.    [provider]  nitroGLYCERIN (NITROSTAT) 0.4 MG SL tablet Place 0.4 mg under the tongue  every 5 (five) minutes as needed for chest pain.  12/17/10   [provider]  spironolactone (ALDACTONE) 25 MG tablet Take 1 tablet (25 mg total) by mouth daily. Do not give if her SystolicBP is under 008 or if diastolic BP is under 70 Patient not taking: Reported on 03/31/2018 03/13/18   Birdie Sons, MD      VITAL SIGNS:  Blood pressure (!) 181/53, pulse 62, temperature (!) 97.3 F (36.3 C), resp. rate 17, height 5' 2"  (1.575 m), weight 60.3 kg (133 lb), SpO2 97 %.  PHYSICAL EXAMINATION:   Physical Exam  GENERAL:  72 y.o.-year-old patient lying in the bed with no acute distress.  EYES: Pupils equal, round, reactive to light and accommodation. No scleral icterus. Extraocular muscles intact.  HEENT: Head atraumatic, normocephalic. Oropharynx and nasopharynx clear.  NECK:  Supple, no jugular venous distention. No thyroid enlargement, no tenderness.  LUNGS: Normal breath sounds bilaterally, no wheezing, rales,rhonchi or crepitation. No use  of accessory muscles of respiration.  Decreased bibasilar breath sounds CARDIOVASCULAR: S1, S2 normal. No rubs, or gallops.  2/6 systolic murmur present ABDOMEN: Soft, tender in right upper quadrant, nondistended. Bowel sounds present. No organomegaly or mass.  EXTREMITIES: No pedal edema, cyanosis, or clubbing.  NEUROLOGIC: Cranial nerves II through XII are intact. Muscle strength 5/5 in all extremities. Sensation intact. Gait not checked.  PSYCHIATRIC: The patient is alert and oriented x 3.  SKIN: No obvious rash, lesion, or ulcer.   LABORATORY PANEL:   CBC Recent Labs  Lab 03/30/18 1140  WBC 2.8*  HGB 8.8*  HCT 26.5*  PLT 87*   ------------------------------------------------------------------------------------------------------------------  Chemistries  No results for input(s): NA, K, CL, CO2, GLUCOSE, BUN, CREATININE, CALCIUM, MG, AST, ALT, ALKPHOS, BILITOT in the last 168 hours.  Invalid input(s):  GFRCGP ------------------------------------------------------------------------------------------------------------------  Cardiac Enzymes No results for input(s): TROPONINI in the last 168 hours. ------------------------------------------------------------------------------------------------------------------  RADIOLOGY:  No results found.  EKG:   Orders placed or performed during the hospital encounter of 03/06/18  . EKG 12-Lead  . EKG 12-Lead  . EKG    IMPRESSION AND PLAN:   Margaret Romero  is a 72 y.o. 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  1.  Alcoholic liver cirrhosis with variceal bleed-status post obliteration, recent banding and he most spray today -Hemoglobin is stable this morning.  On octreotide drip.  Continue Protonix -Admit under observation, repeat hemoglobin -Blood pressure is stable -Continue on nadolol -GI to follow  2.  Hypoxia-secondary to sedation and during the procedure.  Stable now -On 2 L oxygen.  Check chest x-ray -Incentive spirometry and wean off oxygen as tolerated  3.  History of medullary thyroid cancer-now has pulmonary nodules and a liver lesion -Following up with oncology as outpatient.  Plan to repeat scans every 3 months.  4.  Hypothyroidism-continue Synthroid  5.  Bipolar-also has mild dementia.  Continue home medications  6.  DVT prophylaxis-only teds and SCDs for now  Her friend and daughter updated at bedside  All the records are reviewed and case discussed with ED provider. Management plans discussed with the patient, family and they are in agreement.  CODE STATUS: Full code  TOTAL TIME TAKING CARE OF THIS PATIENT: 55  minutes.    Margaret Romero M.D on 03/31/2018 at 1:00 PM  Between 7am to 6pm - Pager - [phone]  After 6pm go to [email or web address] - password [email or web address] [facility]  Office  [phone]  CC: Primary care physician; Birdie Sons, MD

## 2018-03-31 NOTE — Anesthesia Procedure Notes (Signed)
Performed by: Demetrius Charity, CRNA Pre-anesthesia Checklist: Patient identified, Emergency Drugs available, Suction available, Patient being monitored and Timeout performed Patient Re-evaluated:Patient Re-evaluated prior to induction Oxygen Delivery Method: Nasal cannula Induction Type: IV induction

## 2018-03-31 NOTE — Anesthesia Post-op Follow-up Note (Deleted)
Anesthesia QCDR form completed.        

## 2018-03-31 NOTE — Transfer of Care (Signed)
Immediate Anesthesia Transfer of Care Note  Patient: Margaret Romero  Procedure(s) Performed: ESOPHAGOGASTRODUODENOSCOPY (EGD) WITH PROPOFOL (N/A )  Patient Location: PACU  Anesthesia Type:General  Level of Consciousness: awake, alert  and oriented  Airway & Oxygen Therapy: Patient Spontanous Breathing and Patient connected to face mask oxygen  Post-op Assessment: Report given to RN and Post -op Vital signs reviewed and stable  Post vital signs: Reviewed and stable  Last Vitals:  Vitals Value Taken Time  BP    Temp    Pulse 76 03/31/2018 11:33 AM  Resp 7 03/31/2018 11:33 AM  SpO2 96 % 03/31/2018 11:33 AM  Vitals shown include unvalidated device data.  Last Pain:  Vitals:   03/31/18 0952  TempSrc: Tympanic  PainSc: 0-No pain      Patients Stated Pain Goal: 0 (32/44/01 0272)  Complications: No apparent anesthesia complications

## 2018-03-31 NOTE — Anesthesia Post-op Follow-up Note (Signed)
Anesthesia QCDR form completed.        

## 2018-03-31 NOTE — Anesthesia Preprocedure Evaluation (Addendum)
Anesthesia Evaluation  Patient identified by MRN, date of birth, ID band Patient awake    Reviewed: Allergy & Precautions, NPO status , Patient's Chart, lab work & pertinent test results  History of Anesthesia Complications Negative for: history of anesthetic complications  Airway Mallampati: II  TM Distance: >3 FB Neck ROM: Full    Dental  (+) Upper Dentures, Edentulous Lower   Pulmonary neg sleep apnea, neg COPD, former smoker,    breath sounds clear to auscultation- rhonchi (-) wheezing      Cardiovascular hypertension, Pt. on medications + CAD, + Cardiac Stents (2 stents in 2006) and + Peripheral Vascular Disease   Rhythm:Regular Rate:Normal - Systolic murmurs and - Diastolic murmurs    Neuro/Psych PSYCHIATRIC DISORDERS Anxiety Depression Bipolar Disorder Dementia negative neurological ROS     GI/Hepatic PUD, GERD  ,(+) Cirrhosis       , GIB, hemoptysis     Endo/Other  negative endocrine ROSneg diabetes  Renal/GU negative Renal ROS     Musculoskeletal negative musculoskeletal ROS (+)   Abdominal (+) - obese,   Peds  Hematology  (+) anemia ,   Anesthesia Other Findings Past Medical History: No date: Bipolar affective ([place]) No date: Cancer ([place])     Comment:  thyroid cancer  11/16/2015: Closed fracture carpal bone No date: GERD (gastroesophageal reflux disease) No date: Head injury without fracture of skull No date: Hypertension January 23, 2011: Malignant neoplasm of thyroid gland ([facility])     Comment:  medullary carcinoma thyroid T2, Nx. No date: Memory changes No date: Thyroid cancer ([place])   Reproductive/Obstetrics                             Anesthesia Physical  Anesthesia Plan  ASA: III  Anesthesia Plan: General   Post-op Pain Management:    Induction: Intravenous  PONV Risk Score and Plan: 1 and Propofol infusion  Airway Management Planned: Nasal  Cannula  Additional Equipment:   Intra-op Plan:   Post-operative Plan:   Informed Consent: I have reviewed the patients History and Physical, chart, labs and discussed the procedure including the risks, benefits and alternatives for the proposed anesthesia with the patient or authorized representative who has indicated his/her understanding and acceptance.   Dental advisory given  Plan Discussed with: CRNA and Anesthesiologist  Anesthesia Plan Comments:        Anesthesia Quick Evaluation

## 2018-03-31 NOTE — Interval H&P Note (Signed)
History and Physical Interval Note:  03/31/2018 10:30 AM  Margaret Romero  has presented today for surgery, with the diagnosis of CIRRHOSIS HX ULCERATIVE  COLITIS  The various methods of treatment have been discussed with the patient and family. After consideration of risks, benefits and other options for treatment, the patient has consented to  Procedure(s): ESOPHAGOGASTRODUODENOSCOPY (EGD) WITH PROPOFOL (N/A) as a surgical intervention .  The patient's history has been reviewed, patient examined, no change in status, stable for surgery.  I have reviewed the patient's chart and labs.  Questions were answered to the patient's satisfaction.     [place], [place]

## 2018-03-31 NOTE — Op Note (Addendum)
[facility] Patient Name: Margaret Romero Procedure Date: 03/31/2018 10:33 AM MRN: [id number] Account #: [id number] Date of Birth: 08-12-1946 Admit Type: Outpatient Age: 72 Room: [facility] ENDO ROOM 2 Gender: Female Note Status: Finalized Procedure:            Upper GI endoscopy Indications:          For therapy of esophageal varices in patient with                        suspected portal hypertension Providers:            Benay Pike. Alice Reichert MD, MD Referring MD:         Kirstie Peri. Fisher, MD (Referring MD) Medicines:            Propofol per Anesthesia, General Anesthesia Complications:        Significant hemorrhage - required intubation, Hypoxia,                        treated with intubation, treated with administration of                        oxygen, treated with stabilization and transfer to the                        ICU Procedure:            Pre-Anesthesia Assessment:                       - The risks and benefits of the procedure and the                        sedation options and risks were discussed with the                        patient. All questions were answered and informed                        consent was obtained.                       - Patient identification and proposed procedure were                        verified prior to the procedure by the nurse. The                        procedure was verified in the procedure room.                       - ASA Grade Assessment: III - A patient with severe                        systemic disease.                       - After reviewing the risks and benefits, the patient                        was deemed in satisfactory condition to undergo the  procedure.                       After obtaining informed consent, the endoscope was                        passed under direct vision. Throughout the procedure,                        the patient's blood pressure, pulse, and  oxygen                        saturations were monitored continuously. The Endoscope                        was introduced through the mouth, and advanced to the                        third part of duodenum. The upper GI endoscopy was                        accomplished without difficulty. The patient tolerated                        the procedure poorly due to the patient's respiratory                        instability (respiratory failure). Findings:      Two columns of non-bleeding grade II varices were found in the middle       third of the esophagus and in the lower third of the esophagus,. They       were 12 mm in largest diameter. No red wale signs were present. The       varices appeared smaller than they were at prior exam. Two bands were       successfully placed with incomplete eradication of varices. Steady       bleeding continued at the end of the procedure. Estimated blood loss: 35       mL requiring treatment with Hemospray x 2 treatments to the area,       bleeding stopped.      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      Type 1 gastroesophageal varices (GOV1, esophageal varices which extend       along the lesser curvature) with no bleeding were found in the cardia.       There were no stigmata of recent bleeding.      The examined duodenum was normal. Impression:           - Non-bleeding grade II esophageal varices.                        Incompletely eradicated. Banded.                       - Portal hypertensive gastropathy.                       - Type 1 gastroesophageal varices (GOV1, esophageal                        varices which extend along the lesser curvature),  without bleeding.                       - Normal examined duodenum.                       - No specimens collected. Recommendation:       - Admit the patient to ICU until patient is stable.                       - Administer an IV bolus of 50  micrograms of octreotide                        followed by an infusion of 50 micrograms per hour for 2                        days.                       - NPO today.                       - The findings and recommendations were discussed with                        the patient and their designated guardian.                       - See inpatient chart and orders for further                        recommendations Procedure Code(s):    --- Professional ---                       [id number], Esophagogastroduodenoscopy, flexible, transoral;                        with band ligation of esophageal/gastric varices Diagnosis Code(s):    --- Professional ---                       I85.00, Esophageal varices without bleeding                       K76.6, Portal hypertension                       K31.89, Other diseases of stomach and duodenum                       I86.4, Gastric varices CPT copyright 2017 American Medical Association. All rights reserved. The codes documented in this report are preliminary and upon coder review may  be revised to meet current compliance requirements. Efrain Sella MD, MD 03/31/2018 11:31:57 AM This report has been signed electronically. Number of Addenda: 0 Note Initiated On: 03/31/2018 10:33 AM      [facility]

## 2018-04-01 ENCOUNTER — Telehealth: Payer: Self-pay | Admitting: *Deleted

## 2018-04-01 ENCOUNTER — Observation Stay: Payer: Medicare Other

## 2018-04-01 DIAGNOSIS — I864 Gastric varices: Secondary | ICD-10-CM | POA: Diagnosis not present

## 2018-04-01 DIAGNOSIS — Z955 Presence of coronary angioplasty implant and graft: Secondary | ICD-10-CM | POA: Diagnosis not present

## 2018-04-01 DIAGNOSIS — K9161 Intraoperative hemorrhage and hematoma of a digestive system organ or structure complicating a digestive sytem procedure: Secondary | ICD-10-CM | POA: Diagnosis not present

## 2018-04-01 DIAGNOSIS — I6529 Occlusion and stenosis of unspecified carotid artery: Secondary | ICD-10-CM | POA: Diagnosis not present

## 2018-04-01 DIAGNOSIS — I10 Essential (primary) hypertension: Secondary | ICD-10-CM | POA: Diagnosis not present

## 2018-04-01 DIAGNOSIS — Z79899 Other long term (current) drug therapy: Secondary | ICD-10-CM | POA: Diagnosis not present

## 2018-04-01 DIAGNOSIS — K766 Portal hypertension: Secondary | ICD-10-CM | POA: Diagnosis not present

## 2018-04-01 DIAGNOSIS — K219 Gastro-esophageal reflux disease without esophagitis: Secondary | ICD-10-CM | POA: Diagnosis not present

## 2018-04-01 DIAGNOSIS — I2510 Atherosclerotic heart disease of native coronary artery without angina pectoris: Secondary | ICD-10-CM | POA: Diagnosis not present

## 2018-04-01 DIAGNOSIS — M40204 Unspecified kyphosis, thoracic region: Secondary | ICD-10-CM | POA: Diagnosis not present

## 2018-04-01 DIAGNOSIS — I8500 Esophageal varices without bleeding: Secondary | ICD-10-CM | POA: Diagnosis not present

## 2018-04-01 DIAGNOSIS — K3189 Other diseases of stomach and duodenum: Secondary | ICD-10-CM | POA: Diagnosis not present

## 2018-04-01 DIAGNOSIS — Z79890 Hormone replacement therapy (postmenopausal): Secondary | ICD-10-CM | POA: Diagnosis not present

## 2018-04-01 DIAGNOSIS — R918 Other nonspecific abnormal finding of lung field: Secondary | ICD-10-CM | POA: Diagnosis not present

## 2018-04-01 DIAGNOSIS — R0902 Hypoxemia: Secondary | ICD-10-CM | POA: Diagnosis not present

## 2018-04-01 DIAGNOSIS — K7460 Unspecified cirrhosis of liver: Secondary | ICD-10-CM | POA: Diagnosis not present

## 2018-04-01 DIAGNOSIS — E039 Hypothyroidism, unspecified: Secondary | ICD-10-CM | POA: Diagnosis not present

## 2018-04-01 DIAGNOSIS — J9691 Respiratory failure, unspecified with hypoxia: Secondary | ICD-10-CM | POA: Diagnosis not present

## 2018-04-01 DIAGNOSIS — J90 Pleural effusion, not elsewhere classified: Secondary | ICD-10-CM | POA: Diagnosis not present

## 2018-04-01 DIAGNOSIS — C787 Secondary malignant neoplasm of liver and intrahepatic bile duct: Secondary | ICD-10-CM | POA: Diagnosis not present

## 2018-04-01 DIAGNOSIS — Z85850 Personal history of malignant neoplasm of thyroid: Secondary | ICD-10-CM | POA: Diagnosis not present

## 2018-04-01 DIAGNOSIS — Z87891 Personal history of nicotine dependence: Secondary | ICD-10-CM | POA: Diagnosis not present

## 2018-04-01 DIAGNOSIS — Z8719 Personal history of other diseases of the digestive system: Secondary | ICD-10-CM | POA: Diagnosis not present

## 2018-04-01 DIAGNOSIS — Z884 Allergy status to anesthetic agent status: Secondary | ICD-10-CM | POA: Diagnosis not present

## 2018-04-01 LAB — BASIC METABOLIC PANEL
Anion gap: 3 — ABNORMAL LOW (ref 5–15)
BUN: 14 mg/dL (ref 6–20)
CHLORIDE: 110 mmol/L (ref 101–111)
CO2: 26 mmol/L (ref 22–32)
Calcium: 8 mg/dL — ABNORMAL LOW (ref 8.9–10.3)
Creatinine, Ser: 0.75 mg/dL (ref 0.44–1.00)
GFR calc Af Amer: >60 mL/min (ref >60)
GFR calc non Af Amer: >60 mL/min (ref >60)
GLUCOSE: 112 mg/dL — AB (ref 65–99)
Potassium: 4.7 mmol/L (ref 3.5–5.1)
Sodium: 139 mmol/L (ref 135–145)

## 2018-04-01 LAB — CBC
HEMATOCRIT: 25.9 % — AB (ref 35.0–47.0)
Hemoglobin: 8.3 g/dL — ABNORMAL LOW (ref 12.0–16.0)
MCH: 30 pg (ref 26.0–34.0)
MCHC: 32.2 g/dL (ref 32.0–36.0)
MCV: 93.2 fL (ref 80.0–100.0)
PLATELETS: 78 10*3/uL — AB (ref 150–440)
RBC: 2.78 MIL/uL — ABNORMAL LOW (ref 3.80–5.20)
RDW: 17 % — AB (ref 11.5–14.5)
WBC: 2.9 10*3/uL — ABNORMAL LOW (ref 3.6–11.0)

## 2018-04-01 LAB — HEMOGLOBIN: Hemoglobin: 8.3 g/dL — ABNORMAL LOW (ref 12.0–16.0)

## 2018-04-01 MED ORDER — FUROSEMIDE 10 MG/ML IJ SOLN
20.0000 mg | Freq: Once | INTRAMUSCULAR | Status: AC
  Administered 2018-04-01: 23:00:00 20 mg via INTRAVENOUS
  Filled 2018-04-01: qty 2

## 2018-04-01 NOTE — Anesthesia Postprocedure Evaluation (Signed)
Anesthesia Post Note  Patient: Margaret Romero  Procedure(s) Performed: ESOPHAGOGASTRODUODENOSCOPY (EGD) WITH PROPOFOL (N/A )  Patient location during evaluation: Endoscopy Anesthesia Type: General Level of consciousness: awake and alert Pain management: pain level controlled Vital Signs Assessment: post-procedure vital signs reviewed and stable Respiratory status: spontaneous breathing, nonlabored ventilation, respiratory function stable and patient connected to nasal cannula oxygen Cardiovascular status: blood pressure returned to baseline and stable Postop Assessment: no apparent nausea or vomiting Anesthetic complications: no     Last Vitals:  Vitals:   04/01/18 0816 04/01/18 1203  BP: (!) 153/64 (!) 153/53  Pulse: (!) 58 60  Resp: 20 14  Temp: 37.2 C 37.3 C  SpO2: 95% (!) 87%    Last Pain:  Vitals:   04/01/18 1457  TempSrc:   PainSc: 0-No pain                 Martha Clan

## 2018-04-01 NOTE — Progress Notes (Signed)
Please note patient has a pending outpatient Palliative referral. CMRN Hoy Finlay made aware. Flo Shanks RN, BSN, [place] [facility] [place], [facility] [phone]

## 2018-04-01 NOTE — Progress Notes (Signed)
Dr Alice Reichert in to see pt. Diet ordered of 2 gm na.  Tried to wean pt earlier  From 02 but pts sats dropped to 85% back on 2 l  sats now 95-96%.

## 2018-04-01 NOTE — Telephone Encounter (Signed)
Pts caregiver left vm regarding scheduling iron infusions. Pt is currently admitted, caregiver would like to know if iron can be given while pt is hospitalized.

## 2018-04-01 NOTE — Care Management Obs Status (Signed)
[place] NOTIFICATION   Patient Details  Name: Margaret Romero MRN: [id number] Date of Birth: 1946-03-03   Medicare Observation Status Notification Given:  Yes    Katrina Stack, RN 04/01/2018, 11:04 AM

## 2018-04-01 NOTE — Plan of Care (Signed)
  Problem: Education: Goal: Ability to identify signs and symptoms of gastrointestinal bleeding will improve Outcome: Progressing   Problem: Bowel/Gastric: Goal: Will show no signs and symptoms of gastrointestinal bleeding Outcome: Progressing   Problem: Fluid Volume: Goal: Will show no signs and symptoms of excessive bleeding Outcome: Progressing   Problem: Clinical Measurements: Goal: Complications related to the disease process, condition or treatment will be avoided or minimized Outcome: Progressing   Problem: Education: Goal: Knowledge of General Education information will improve Outcome: Progressing   Problem: Safety: Goal: Ability to remain free from injury will improve Outcome: Progressing

## 2018-04-01 NOTE — Progress Notes (Signed)
[facility] [place] NAME: Margaret Romero    MR#:  [id number]  DATE OF BIRTH:  03/18/1946  SUBJECTIVE:  CHIEF COMPLAINT:  No chief complaint on file. Was admitted after EGD, as had some bleed after the procedure. No more bleeding, no vomiting, BM was brownish, no blood.  REVIEW OF SYSTEMS:  CONSTITUTIONAL: No fever, is fatigue or weakness.  EYES: No blurred or double vision.  EARS, NOSE, AND THROAT: No tinnitus or ear pain.  RESPIRATORY: No cough,have shortness of breath,no wheezing or hemoptysis.  CARDIOVASCULAR: No chest pain, orthopnea, edema.  GASTROINTESTINAL: No nausea, vomiting, diarrhea or abdominal pain.  GENITOURINARY: No dysuria, hematuria.  ENDOCRINE: No polyuria, nocturia,  HEMATOLOGY: No anemia, easy bruising or bleeding SKIN: No rash or lesion. MUSCULOSKELETAL: No joint pain or arthritis.   NEUROLOGIC: No tingling, numbness, weakness.  PSYCHIATRY: No anxiety or depression.   ROS  DRUG ALLERGIES:   Allergies  Allergen Reactions  . Propofol Other (See Comments)    Reaction: unknown suspect allergy    VITALS:  Blood pressure (!) 159/47, pulse 60, temperature 98.2 F (36.8 C), temperature source Oral, resp. rate 18, height 5' 2"  (1.575 m), weight 60.3 kg (133 lb), SpO2 96 %.  PHYSICAL EXAMINATION:  Physical Exam   GENERAL:  72 y.o.-year-old patient lying in the bed with no acute distress.  EYES: Pupils equal, round, reactive to light and accommodation. No scleral icterus. Extraocular muscles intact.  HEENT: Head atraumatic, normocephalic. Oropharynx and nasopharynx clear.  NECK:  Supple, no jugular venous distention. No thyroid enlargement, no tenderness.  LUNGS: Normal breath sounds bilaterally, no wheezing, some crepitation. No use of accessory muscles of respiration.  Decreased bibasilar breath sounds CARDIOVASCULAR: S1, S2 normal. No rubs, or gallops.  2/6 systolic murmur present ABDOMEN: Soft, tender in right  upper quadrant, nondistended. Bowel sounds present. No organomegaly or mass.  EXTREMITIES: No pedal edema, cyanosis, or clubbing.  NEUROLOGIC: Cranial nerves II through XII are intact. Muscle strength 4/5 in all extremities. Sensation intact. Gait not checked.  PSYCHIATRIC: The patient is alert and oriented x 3.  SKIN: No obvious rash, lesion, or ulcer.     LABORATORY PANEL:   CBC Recent Labs  Lab 04/01/18 0501 04/01/18 1249  WBC 2.9*  --   HGB 8.3* 8.3*  HCT 25.9*  --   PLT 78*  --    ------------------------------------------------------------------------------------------------------------------  Chemistries  Recent Labs  Lab 04/01/18 0501  NA 139  K 4.7  CL 110  CO2 26  GLUCOSE 112*  BUN 14  CREATININE 0.75  CALCIUM 8.0*   ------------------------------------------------------------------------------------------------------------------  Cardiac Enzymes No results for input(s): TROPONINI in the last 168 hours. ------------------------------------------------------------------------------------------------------------------  RADIOLOGY:  Dg Chest Port 1 View  Result Date: 04/01/2018 CLINICAL DATA:  73 year old female status post EGD yesterday for therapy of esophageal varices. Hypoxia. Portal hypertensive gastropathy. EXAM: PORTABLE CHEST 1 VIEW COMPARISON:  Chest CT 03/18/2018 and earlier. FINDINGS: Portable AP upright view at 1239 hours. Stable lung volumes. Stable cardiac size and mediastinal contours. Visualized tracheal air column is within normal limits. No pneumothorax or pulmonary edema. No pleural effusion is evident. Patchy and streaky medial lung base opacity persists, and this most resembles atelectasis on the CT 2 weeks ago. No other confluent pulmonary opacity. Paucity of bowel gas in the upper abdomen. IMPRESSION: 1. Persistent bibasilar opacity similar to that on the 03/18/2018 CT which most resembles atelectasis. 2. No new cardiopulmonary abnormality  identified. Electronically Signed   By:  Genevie Ann M.D.   On: 04/01/2018 07:55    ASSESSMENT AND PLAN:   Active Problems:   GI bleed   Margaret Romero  is a 72 y.o. 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  1.  Alcoholic liver cirrhosis with variceal bleed-status post obliteration, recent banding  -Hemoglobin is stable this morning.  On octreotide drip.  Continue Protonix -stable on repeat hemoglobin -Blood pressure is stable -Continue on nadolol -GI has suggested no further works ups.  2.  Hypoxia-secondary to sedation and during the procedure.  still requires. -On 2 L oxygen.  Check chest x-ray -Incentive spirometry and wean off oxygen as tolerated  3.  History of medullary thyroid cancer-now has pulmonary nodules and a liver lesion -Following up with oncology as outpatient.  Plan to repeat scans every 3 months.  4.  Hypothyroidism-continue Synthroid  5.  Bipolar-also has mild dementia.  Continue home medications  6.  DVT prophylaxis-only teds and SCDs for now    All the records are reviewed and case discussed with Care Management/Social Workerr. Management plans discussed with the patient, family and they are in agreement.  CODE STATUS: full  TOTAL TIME TAKING CARE OF THIS PATIENT: 35 minutes.     POSSIBLE D/C IN 1-2 DAYS, DEPENDING ON CLINICAL CONDITION.   [name] M.D on 04/01/2018   Between 7am to 6pm - Pager - [phone]  After 6pm go to [email or web address] - password [email or web address] [facility]  Office  [phone]  CC: Primary care physician; Birdie Sons, MD  Note: This dictation was prepared with Dragon dictation along with  smaller phrase technology. Any transcriptional errors that result from this process are unintentional.

## 2018-04-01 NOTE — Progress Notes (Signed)
Pt up to bsc with assist 3 times consecutively for bms. Brown in color. Pt had c/o of stomach hurting prior. Tylenol given earlier.  Resting quietly now stomach better after bms.

## 2018-04-01 NOTE — Progress Notes (Signed)
[facility] Inpatient Courtesy Note  Subjective: Patient seen for followup of variceal bleeding during attempted variceal band ligation on 03/3018. Appears to be doing well without recurrent bleeding after hemospray hemostasis yesterday during endoscopy. Patient has some complaints of diffuse abdominal discomfort.  Objective: Vital signs in last 24 hours: Temp:  [98 F (36.7 C)-99.4 F (37.4 C)] 99.1 F (37.3 C) (05/01 1203) Pulse Rate:  [58-72] 60 (05/01 1203) Resp:  [14-25] 14 (05/01 1203) BP: (143-181)/(51-64) 153/53 (05/01 1203) SpO2:  [87 %-100 %] 87 % (05/01 1203) Blood pressure (!) 153/53, pulse 60, temperature 99.1 F (37.3 C), temperature source Oral, resp. rate 14, height 5' 2"  (1.575 m), weight 60.3 kg (133 lb), SpO2 (!) 87 %.    Intake/Output from previous day: 04/30 0701 - 05/01 0700 In: 1017.1 [I.V.:1017.1] Out: 900 [Urine:850; Blood:50]  Intake/Output this shift: No intake/output data recorded.   General appearance:  Somewhat somnolent, no acute distress Resp: Rel CTA Cardio: RRR no gallop GI:  Soft mildly distended, non-tender. Extremities:  Trace edema.   Lab Results: Results for orders placed or performed during the hospital encounter of 03/31/18 (from the past 24 hour(s))  CBC     Status: Abnormal   Collection Time: 03/31/18  1:11 PM  Result Value Ref Range   WBC 2.0 (L) 3.6 - 11.0 K/uL   RBC 2.82 (L) 3.80 - 5.20 MIL/uL   Hemoglobin 8.4 (L) 12.0 - 16.0 g/dL   HCT 26.6 (L) 35.0 - 47.0 %   MCV 94.3 80.0 - 100.0 fL   MCH 29.9 26.0 - 34.0 pg   MCHC 31.7 (L) 32.0 - 36.0 g/dL   RDW 16.8 (H) 11.5 - 14.5 %   Platelets 82 (L) 150 - 440 K/uL  Basic metabolic panel     Status: Abnormal   Collection Time: 03/31/18  1:11 PM  Result Value Ref Range   Sodium 141 135 - 145 mmol/L   Potassium 4.1 3.5 - 5.1 mmol/L   Chloride 110 101 - 111 mmol/L   CO2 27 22 - 32 mmol/L   Glucose, Bld 105 (H) 65 - 99 mg/dL   BUN 9 6 - 20 mg/dL   Creatinine,  Ser 0.73 0.44 - 1.00 mg/dL   Calcium 7.9 (L) 8.9 - 10.3 mg/dL   GFR calc non Af Amer >60 >60 mL/min   GFR calc Af Amer >60 >60 mL/min   Anion gap 4 (L) 5 - 15  Hemoglobin     Status: Abnormal   Collection Time: 03/31/18  8:31 PM  Result Value Ref Range   Hemoglobin 8.8 (L) 12.0 - 16.0 g/dL  Basic metabolic panel     Status: Abnormal   Collection Time: 04/01/18  5:01 AM  Result Value Ref Range   Sodium 139 135 - 145 mmol/L   Potassium 4.7 3.5 - 5.1 mmol/L   Chloride 110 101 - 111 mmol/L   CO2 26 22 - 32 mmol/L   Glucose, Bld 112 (H) 65 - 99 mg/dL   BUN 14 6 - 20 mg/dL   Creatinine, Ser 0.75 0.44 - 1.00 mg/dL   Calcium 8.0 (L) 8.9 - 10.3 mg/dL   GFR calc non Af Amer >60 >60 mL/min   GFR calc Af Amer >60 >60 mL/min   Anion gap 3 (L) 5 - 15  CBC     Status: Abnormal   Collection Time: 04/01/18  5:01 AM  Result Value Ref Range   WBC 2.9 (L) 3.6 - 11.0 K/uL  RBC 2.78 (L) 3.80 - 5.20 MIL/uL   Hemoglobin 8.3 (L) 12.0 - 16.0 g/dL   HCT 25.9 (L) 35.0 - 47.0 %   MCV 93.2 80.0 - 100.0 fL   MCH 30.0 26.0 - 34.0 pg   MCHC 32.2 32.0 - 36.0 g/dL   RDW 17.0 (H) 11.5 - 14.5 %   Platelets 78 (L) 150 - 440 K/uL     Recent Labs    03/30/18 1140 03/31/18 1311 03/31/18 2031 04/01/18 0501  WBC 2.8* 2.0*  --  2.9*  HGB 8.8* 8.4* 8.8* 8.3*  HCT 26.5* 26.6*  --  25.9*  PLT 87* 82*  --  78*   BMET Recent Labs    03/31/18 1311 04/01/18 0501  NA 141 139  K 4.1 4.7  CL 110 110  CO2 27 26  GLUCOSE 105* 112*  BUN 9 14  CREATININE 0.73 0.75  CALCIUM 7.9* 8.0*   LFT No results for input(s): PROT, ALBUMIN, AST, ALT, ALKPHOS, BILITOT, BILIDIR, IBILI in the last 72 hours. PT/INR No results for input(s): LABPROT, INR in the last 72 hours. Hepatitis Panel No results for input(s): HEPBSAG, HCVAB, HEPAIGM, HEPBIGM in the last 72 hours. C-Diff No results for input(s): CDIFFTOX in the last 72 hours. No results for input(s): CDIFFPCR in the last 72 hours.   Studies/Results: Dg Chest  Port 1 View  Result Date: 04/01/2018 CLINICAL DATA:  72 year old female status post EGD yesterday for therapy of esophageal varices. Hypoxia. Portal hypertensive gastropathy. EXAM: PORTABLE CHEST 1 VIEW COMPARISON:  Chest CT 03/18/2018 and earlier. FINDINGS: Portable AP upright view at 1239 hours. Stable lung volumes. Stable cardiac size and mediastinal contours. Visualized tracheal air column is within normal limits. No pneumothorax or pulmonary edema. No pleural effusion is evident. Patchy and streaky medial lung base opacity persists, and this most resembles atelectasis on the CT 2 weeks ago. No other confluent pulmonary opacity. Paucity of bowel gas in the upper abdomen. IMPRESSION: 1. Persistent bibasilar opacity similar to that on the 03/18/2018 CT which most resembles atelectasis. 2. No new cardiopulmonary abnormality identified. Electronically Signed   By: Genevie Ann M.D.   On: 04/01/2018 07:55    Scheduled Inpatient Medications:   . donepezil  10 mg Oral QHS  . escitalopram  10 mg Oral Daily  . lamoTRIgine  25 mg Oral Daily  . levothyroxine  75 mcg Oral Daily  . mesalamine  1.2 g Oral Q breakfast  . multivitamin with minerals  1 tablet Oral Daily  . nadolol  20 mg Oral Daily  . pantoprazole  40 mg Oral BID    Continuous Inpatient Infusions:   . octreotide  (SANDOSTATIN)    IV infusion 50 mcg/hr (04/01/18 0915)    PRN Inpatient Medications:  acetaminophen **OR** acetaminophen, albuterol, hydrALAZINE, ondansetron **OR** ondansetron (ZOFRAN) IV  Miscellaneous: CXR: bilateral atelectasis, No infiltrates or other acute cardiopulmonary disease mentioned.   Assessment:  1. Hypoxia (85% on RA). Appreciate hospitalist input (Dr. Marthann Schiller) and care. 2. Variceal bleeding during endoscopy. Stable. 3. Alcoholic cirrhosis, Child Pugh Class "C" 11 points; MELD score of 21.  4. Hypertension - Antihypertensives on hold.  Plan:  1. May discontinue octreotide after 24 hours. 2. Advance diet. 3.  Address hypoxia. 4. Patient could go home from a GI standpoint when otherwise stable from a medical standpoint. 5. Follow up in my office in 2 weeks.  6. Will follow peripherally. Call me in the interim if I can help.   Terressa Evola K. Pilsen,  M.D. 04/01/2018, 12:23 PM

## 2018-04-01 NOTE — Telephone Encounter (Signed)
If hospitalist team thinks it is necessary, I am ok. If non urgent, she can get it outpatient.

## 2018-04-02 ENCOUNTER — Encounter: Payer: Self-pay | Admitting: Internal Medicine

## 2018-04-02 ENCOUNTER — Observation Stay: Payer: Medicare Other

## 2018-04-02 DIAGNOSIS — I2510 Atherosclerotic heart disease of native coronary artery without angina pectoris: Secondary | ICD-10-CM | POA: Diagnosis not present

## 2018-04-02 DIAGNOSIS — K9161 Intraoperative hemorrhage and hematoma of a digestive system organ or structure complicating a digestive sytem procedure: Secondary | ICD-10-CM | POA: Diagnosis not present

## 2018-04-02 DIAGNOSIS — Z85850 Personal history of malignant neoplasm of thyroid: Secondary | ICD-10-CM | POA: Diagnosis not present

## 2018-04-02 DIAGNOSIS — E785 Hyperlipidemia, unspecified: Secondary | ICD-10-CM | POA: Diagnosis not present

## 2018-04-02 DIAGNOSIS — J9691 Respiratory failure, unspecified with hypoxia: Secondary | ICD-10-CM | POA: Diagnosis not present

## 2018-04-02 DIAGNOSIS — J90 Pleural effusion, not elsewhere classified: Secondary | ICD-10-CM | POA: Diagnosis not present

## 2018-04-02 DIAGNOSIS — K3189 Other diseases of stomach and duodenum: Secondary | ICD-10-CM | POA: Diagnosis not present

## 2018-04-02 DIAGNOSIS — K7460 Unspecified cirrhosis of liver: Secondary | ICD-10-CM | POA: Diagnosis not present

## 2018-04-02 DIAGNOSIS — K219 Gastro-esophageal reflux disease without esophagitis: Secondary | ICD-10-CM | POA: Diagnosis not present

## 2018-04-02 DIAGNOSIS — Z8719 Personal history of other diseases of the digestive system: Secondary | ICD-10-CM | POA: Diagnosis not present

## 2018-04-02 DIAGNOSIS — K766 Portal hypertension: Secondary | ICD-10-CM | POA: Diagnosis not present

## 2018-04-02 DIAGNOSIS — Z884 Allergy status to anesthetic agent status: Secondary | ICD-10-CM | POA: Diagnosis not present

## 2018-04-02 DIAGNOSIS — Z955 Presence of coronary angioplasty implant and graft: Secondary | ICD-10-CM | POA: Diagnosis not present

## 2018-04-02 DIAGNOSIS — Z79890 Hormone replacement therapy (postmenopausal): Secondary | ICD-10-CM | POA: Diagnosis not present

## 2018-04-02 DIAGNOSIS — C787 Secondary malignant neoplasm of liver and intrahepatic bile duct: Secondary | ICD-10-CM | POA: Diagnosis not present

## 2018-04-02 DIAGNOSIS — D649 Anemia, unspecified: Secondary | ICD-10-CM | POA: Diagnosis not present

## 2018-04-02 DIAGNOSIS — Z79899 Other long term (current) drug therapy: Secondary | ICD-10-CM | POA: Diagnosis not present

## 2018-04-02 DIAGNOSIS — E039 Hypothyroidism, unspecified: Secondary | ICD-10-CM | POA: Diagnosis not present

## 2018-04-02 DIAGNOSIS — I10 Essential (primary) hypertension: Secondary | ICD-10-CM | POA: Diagnosis not present

## 2018-04-02 DIAGNOSIS — R918 Other nonspecific abnormal finding of lung field: Secondary | ICD-10-CM | POA: Diagnosis not present

## 2018-04-02 DIAGNOSIS — M40204 Unspecified kyphosis, thoracic region: Secondary | ICD-10-CM | POA: Diagnosis not present

## 2018-04-02 DIAGNOSIS — I864 Gastric varices: Secondary | ICD-10-CM | POA: Diagnosis not present

## 2018-04-02 DIAGNOSIS — R0902 Hypoxemia: Secondary | ICD-10-CM | POA: Diagnosis not present

## 2018-04-02 DIAGNOSIS — Z87891 Personal history of nicotine dependence: Secondary | ICD-10-CM | POA: Diagnosis not present

## 2018-04-02 DIAGNOSIS — I6529 Occlusion and stenosis of unspecified carotid artery: Secondary | ICD-10-CM | POA: Diagnosis not present

## 2018-04-02 DIAGNOSIS — I8500 Esophageal varices without bleeding: Secondary | ICD-10-CM | POA: Diagnosis not present

## 2018-04-02 DIAGNOSIS — K920 Hematemesis: Secondary | ICD-10-CM | POA: Diagnosis not present

## 2018-04-02 MED ORDER — SPIRONOLACTONE 25 MG PO TABS: 25.0000 mg | ORAL_TABLET | Freq: Every day | ORAL | 1 refills | Status: AC

## 2018-04-02 MED ORDER — NADOLOL 20 MG PO TABS: 20.0000 mg | ORAL_TABLET | Freq: Every day | ORAL | 2 refills | Status: DC

## 2018-04-02 NOTE — Discharge Summary (Addendum)
[place] [facility] [place] NAME: Margaret Romero    MR#:  [id number]  DATE OF BIRTH:  July 21, 1946  DATE OF ADMISSION:  03/31/2018 ADMITTING PHYSICIAN: Margaret Lighter, MD  DATE OF DISCHARGE: 04/02/2018   PRIMARY CARE PHYSICIAN: Margaret Sons, MD    ADMISSION DIAGNOSIS:  CIRRHOSIS HX ULCERATIVE  COLITIS  DISCHARGE DIAGNOSIS:  Active Problems:   GI bleed   SECONDARY DIAGNOSIS:   Past Medical History:  Diagnosis Date  . Bipolar affective ([place])   . Cancer [facility])    thyroid cancer   . Carotid stenosis    carotid artery stenosis status post extended ICU hospitalization after carotid endarterectomy around 2017  . Cirrhosis ([place])   . Closed fracture carpal bone 11/16/2015  . Dementia   . Esophageal varices ([facility])   . GERD (gastroesophageal reflux disease)   . Head injury without fracture of skull   . Hypertension   . Malignant neoplasm of thyroid gland [facility]) January 23, 2011   medullary carcinoma thyroid T2, Nx.  . Memory changes   . 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  1. Alcoholic liver cirrhosis with variceal bleed-status post obliteration, recent banding  -Hemoglobin is stable this morning. On octreotide drip. Continue Protonix -stable on repeat hemoglobin -Blood pressure is stable -Continue onnadolol -GI has suggested no further works ups.  2. Hypoxia-secondary to sedation and during the procedure. still requires. -On 2 L oxygen. Check chest x-ray- some edema, which resolved.. -Incentive spirometry and wean off oxygen as tolerated -  she is on room air and ambulating fine now.  3. History of medullary thyroid cancer-now has pulmonary nodules and a liver lesion -Following up with oncology as outpatient. Plan to repeat scans every 3 months.  4. Hypothyroidism-continue Synthroid  5. Bipolar-also has mild dementia. Continue home medications  6. DVT prophylaxis-only teds and SCDs for now    DISCHARGE CONDITIONS:   Stable.  CONSULTS OBTAINED:  Treatment Team:  Margaret Sella, MD  DRUG ALLERGIES:   Allergies  Allergen Reactions  . Propofol Other (See Comments)    Reaction: unknown suspect allergy    DISCHARGE MEDICATIONS:   Allergies as of 04/02/2018      Reactions   Propofol Other (See Comments)   Reaction: unknown suspect allergy      Medication List    STOP taking these medications   docusate sodium 100 MG capsule Commonly known as:  COLACE     TAKE these medications   albuterol 108 (90 Base) MCG/ACT inhaler Commonly known as:  PROVENTIL HFA;VENTOLIN HFA Inhale 2 puffs into the lungs every 6 (six) hours as needed for wheezing or shortness of breath.   donepezil 10 MG tablet Commonly known as:  ARICEPT Take 10 mg by mouth at bedtime.   escitalopram 10 MG tablet Commonly known as:  LEXAPRO Take 1 tablet (10 mg total) by mouth daily.   ferrous sulfate 325 (65 FE) MG tablet Take 1 tablet (325 mg total) by mouth 2 (two) times daily with a meal. What changed:  when to take this   isosorbide mononitrate 30 MG 24 hr tablet Commonly known as:  IMDUR Take 0.5 tablets (15  mg total) by mouth daily.   lamoTRIgine 25 MG tablet Commonly known as:  LAMICTAL Take 1 tablet (25 mg total) by mouth daily.   levothyroxine 75 MCG tablet Commonly known as:  SYNTHROID, LEVOTHROID TAKE 1 TABLET BY MOUTH ONCE A DAY   mesalamine 1.2 g EC tablet Commonly known as:  LIALDA Take 1 tablet (1.2 g total) by mouth daily with breakfast.   multivitamin with minerals tablet Take 1 tablet by mouth  daily.   nadolol 20 MG tablet Commonly known as:  CORGARD Take 1 tablet (20 mg total) by mouth daily. Don't take , if Systolic BP < 90 What changed:  additional instructions   NITROSTAT 0.4 MG SL tablet Generic drug:  nitroGLYCERIN Place 0.4 mg under the tongue every 5 (five) minutes as needed for chest pain.   pantoprazole 40 MG tablet Commonly known as:  PROTONIX Take 1 tablet (40 mg total) by mouth 2 (two) times daily.   spironolactone 25 MG tablet Commonly known as:  ALDACTONE Take 1 tablet (25 mg total) by mouth daily. Do not give if her SystolicBP is under 465 What changed:  additional instructions        DISCHARGE INSTRUCTIONS:    Follow with GI and hematology clinic as per your schedule.  If you experience worsening of your admission symptoms, develop shortness of breath, life threatening emergency, suicidal or homicidal thoughts you must seek medical attention immediately by calling 911 or calling your MD immediately  if symptoms less severe.  You Must read complete instructions/literature along with all the possible adverse reactions/side effects for all the Medicines you take and that have been prescribed to you. Take any new Medicines after you have completely understood and accept all the possible adverse reactions/side effects.   Please note  You were cared for by a hospitalist during your hospital stay. If you have any questions about your discharge medications or the care you received while you were in the hospital after you are discharged, you can call the unit and asked to speak with the hospitalist on call if the hospitalist that took care of you is not available. Once you are discharged, your primary care physician will handle any further medical issues. Please note that NO REFILLS for any discharge medications will be authorized once you are discharged, as it is imperative that you return to your primary care physician (or establish a relationship with a  primary care physician if you do not have one) for your aftercare needs so that they can reassess your need for medications and monitor your lab values.    Today   CHIEF COMPLAINT:  No chief complaint on file.   HISTORY OF PRESENT ILLNESS:  Margaret Romero  is a 72 y.o. 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 During the procedure for obliteration of her varices, she started to have some bleeding, requiring hemo sprays, blood pressure dropped and she was intubated.  She is currently extubated to 2 L nasal cannula.  Complains of dry mouth.  Have not had any recent bleeding as outpatient.  Has some dark stools due to being on iron supplements.  Blood pressure is improved now.  Not requiring any pressors.  Patient is awake and alert and at baseline.  She is being admitted for observation.   VITAL SIGNS:  Blood pressure (!) 152/47, pulse (!) 55, temperature 98.2 F (36.8 C), temperature source Oral, resp. rate 18, height 5' 2"  (1.575 m), weight 60.3 kg (133 lb), SpO2 90 %.  I/O:    Intake/Output Summary (Last 24 hours) at 04/02/2018 1550 Last data filed at 04/02/2018 1300 Gross per 24 hour  Intake 1671.75 ml  Output 600 ml  Net 1071.75 ml    PHYSICAL EXAMINATION:   GENERAL:72 y.o.-year-old patient lying in the bed with no acute distress.  EYES: Pupils equal, round, reactive to light and accommodation. No scleral icterus. Extraocular muscles intact.  HEENT: Head atraumatic, normocephalic. Oropharynx and nasopharynx clear.  NECK: Supple, no jugular venous distention. No thyroid enlargement, no tenderness.  LUNGS: Normal breath sounds bilaterally, no wheezing, some crepitation. No use of  accessory muscles of respiration.Decreased bibasilar breath sounds CARDIOVASCULAR: S1, S2 normal. No rubs, or gallops.2/6 systolic murmur present ABDOMEN: Soft,tender in right upper quadrant, nondistended. Bowel sounds present. No organomegaly or mass.  EXTREMITIES: No pedal edema, cyanosis, or clubbing.  NEUROLOGIC: Cranial nerves II through XII are intact. Muscle strength 4/5 in all extremities. Sensation intact. Gait not checked.  PSYCHIATRIC: The patient is alert and oriented x 3.  SKIN: No obvious rash, lesion, or ulcer.     DATA REVIEW:   CBC Recent Labs  Lab 04/01/18 0501 04/01/18 1249  WBC 2.9*  --   HGB 8.3* 8.3*  HCT 25.9*  --   PLT 78*  --     Chemistries  Recent Labs  Lab 04/01/18 0501  NA 139  K 4.7  CL 110  CO2 26  GLUCOSE 112*  BUN 14  CREATININE 0.75  CALCIUM 8.0*    Cardiac Enzymes No results for input(s): TROPONINI in the last 168 hours.  Microbiology Results  Results for orders placed or performed during the hospital encounter of 03/20/18  Body fluid culture     Status: None   Collection Time: 03/20/18  8:48 AM  Result Value Ref Range Status   Specimen Description   Final    PERITONEAL Performed at [facility], [address], [place], [place] [address]    Special Requests   Final    NONE Performed at [facility], [address] [place], [place] [address]    Gram Stain   Final    CYTOSPIN SMEAR WBC PRESENT, PREDOMINANTLY MONONUCLEAR NO ORGANISMS SEEN    Culture   Final    NO GROWTH 3 DAYS Performed at [facility], [place] [address], [place], [place] [address]    Report Status 03/23/2018 FINAL  Final    RADIOLOGY:  Dg Chest 2 View  Result Date: 04/02/2018 CLINICAL DATA:  Alcoholic liver cirrhosis, variceal bleeding, metastatic thyroid malignancy. EXAM: CHEST - 2 VIEW COMPARISON:  PA and lateral chest x-ray of Apr 01, 2018 FINDINGS: The lungs are hypoinflated. There are bibasilar densities similar to  those seen yesterday. The heart is normal in size. The pulmonary vascularity is not engorged. There is calcification in the wall of the thoracic aorta. There is prominent thoracic kyphosis. IMPRESSION: Stable appearance of the chest since yesterday's study. Bibasilar atelectasis or pneumonia with small bilateral pleural effusions. Mild central pulmonary vascular prominence. Electronically Signed   By: David  Martinique M.D.   On: 04/02/2018 09:24   Dg Chest 2 View  Result Date: 04/01/2018 CLINICAL DATA:  Hypoxia EXAM: CHEST - 2 VIEW COMPARISON:  03/31/2018 FINDINGS: Mild bibasilar  opacities, likely atelectasis. Cardiomegaly. Increased interstitial markings raising the possibility of mild perihilar edema, new. Suspected small bilateral pleural effusions.  No pneumothorax. IMPRESSION: Cardiomegaly with possible mild perihilar edema, new. Suspected small bilateral pleural effusions. Mild bibasilar opacities, likely atelectasis. Electronically Signed   By: Julian Hy M.D.   On: 04/01/2018 21:57    EKG:   Orders placed or performed during the hospital encounter of 03/06/18  . EKG 12-Lead  . EKG 12-Lead  . EKG      Management plans discussed with the patient, family and they are in agreement.  CODE STATUS:     Code Status Orders  (From admission, onward)        Start     Ordered   03/31/18 1350  Full code  Continuous     03/31/18 1349    Code Status History    Date Active Date Inactive Code Status Order ID Comments User Context   03/07/2018 0040 03/10/2018 2329 Full Code [id number]  Lance Coon, MD Inpatient      TOTAL TIME TAKING CARE OF THIS PATIENT: 35 minutes.    [name] M.D on 04/02/2018 at 3:50 PM  Between 7am to 6pm - Pager - [phone]  After 6pm go to [email or web address] - password [email or web address] [place] [facility]  Office  [phone]  CC: Primary care physician; Margaret Sons, MD   Note: This dictation was prepared with Dragon dictation along  with smaller phrase technology. Any transcriptional errors that result from this process are unintentional.

## 2018-04-02 NOTE — Discharge Instructions (Signed)
Palliative care nurse to follow at home after discharge.

## 2018-04-02 NOTE — Progress Notes (Signed)
Discussed discharge instructions and medications with patient and her sister. IV removed. All questions addressed. Patient transported home via car by her sister. Clarise Cruz, RN

## 2018-04-02 NOTE — Progress Notes (Signed)
Pt D/C to home by caregiver. Education completed. VSS. IV removed intact. All questions answered.

## 2018-04-02 NOTE — Care Management (Signed)
Patient to discharge home with resumption of home health services through [facility] and outpatient palliative

## 2018-04-03 ENCOUNTER — Telehealth: Payer: Self-pay | Admitting: Family Medicine

## 2018-04-03 DIAGNOSIS — Z85850 Personal history of malignant neoplasm of thyroid: Secondary | ICD-10-CM | POA: Diagnosis not present

## 2018-04-03 DIAGNOSIS — I8501 Esophageal varices with bleeding: Secondary | ICD-10-CM | POA: Diagnosis not present

## 2018-04-03 DIAGNOSIS — Z87891 Personal history of nicotine dependence: Secondary | ICD-10-CM | POA: Diagnosis not present

## 2018-04-03 DIAGNOSIS — I2510 Atherosclerotic heart disease of native coronary artery without angina pectoris: Secondary | ICD-10-CM | POA: Diagnosis not present

## 2018-04-03 DIAGNOSIS — I739 Peripheral vascular disease, unspecified: Secondary | ICD-10-CM | POA: Diagnosis not present

## 2018-04-03 DIAGNOSIS — E785 Hyperlipidemia, unspecified: Secondary | ICD-10-CM | POA: Diagnosis not present

## 2018-04-03 DIAGNOSIS — K219 Gastro-esophageal reflux disease without esophagitis: Secondary | ICD-10-CM | POA: Diagnosis not present

## 2018-04-03 DIAGNOSIS — K766 Portal hypertension: Secondary | ICD-10-CM | POA: Diagnosis not present

## 2018-04-03 DIAGNOSIS — I10 Essential (primary) hypertension: Secondary | ICD-10-CM | POA: Diagnosis not present

## 2018-04-03 DIAGNOSIS — K7460 Unspecified cirrhosis of liver: Secondary | ICD-10-CM | POA: Diagnosis not present

## 2018-04-03 NOTE — Telephone Encounter (Signed)
Please advise 

## 2018-04-03 NOTE — Telephone Encounter (Signed)
Margaret Romero called needing orders for nursing care 1 week , one time and then three times a week until end of cert.  2 Prn visits.  Margaret's call back is [phone]  Thanks teri

## 2018-04-03 NOTE — Telephone Encounter (Signed)
That's fine

## 2018-04-06 ENCOUNTER — Inpatient Hospital Stay: Attending: Oncology

## 2018-04-06 VITALS — BP 130/65 | HR 68 | Resp 18

## 2018-04-06 DIAGNOSIS — K7460 Unspecified cirrhosis of liver: Secondary | ICD-10-CM | POA: Diagnosis not present

## 2018-04-06 DIAGNOSIS — D509 Iron deficiency anemia, unspecified: Secondary | ICD-10-CM | POA: Insufficient documentation

## 2018-04-06 MED ORDER — SODIUM CHLORIDE 0.9 % IV SOLN
Freq: Once | INTRAVENOUS | Status: AC
  Administered 2018-04-06: 14:00:00 via INTRAVENOUS
  Filled 2018-04-06: qty 1000

## 2018-04-06 MED ORDER — IRON SUCROSE 20 MG/ML IV SOLN
200.0000 mg | Freq: Once | INTRAVENOUS | Status: AC
  Administered 2018-04-06: 200 mg via INTRAVENOUS
  Filled 2018-04-06: qty 10

## 2018-04-06 NOTE — Telephone Encounter (Signed)
Left message advising Lauren.   Thanks,   -Mickel Baas

## 2018-04-07 ENCOUNTER — Ambulatory Visit: Payer: Medicare Other | Admitting: Oncology

## 2018-04-08 ENCOUNTER — Telehealth: Payer: Self-pay | Admitting: Family Medicine

## 2018-04-08 DIAGNOSIS — Z87891 Personal history of nicotine dependence: Secondary | ICD-10-CM | POA: Diagnosis not present

## 2018-04-08 DIAGNOSIS — I8501 Esophageal varices with bleeding: Secondary | ICD-10-CM | POA: Diagnosis not present

## 2018-04-08 DIAGNOSIS — I10 Essential (primary) hypertension: Secondary | ICD-10-CM | POA: Diagnosis not present

## 2018-04-08 DIAGNOSIS — K766 Portal hypertension: Secondary | ICD-10-CM | POA: Diagnosis not present

## 2018-04-08 DIAGNOSIS — K7460 Unspecified cirrhosis of liver: Secondary | ICD-10-CM | POA: Diagnosis not present

## 2018-04-08 DIAGNOSIS — K219 Gastro-esophageal reflux disease without esophagitis: Secondary | ICD-10-CM | POA: Diagnosis not present

## 2018-04-08 DIAGNOSIS — I739 Peripheral vascular disease, unspecified: Secondary | ICD-10-CM | POA: Diagnosis not present

## 2018-04-08 DIAGNOSIS — I2510 Atherosclerotic heart disease of native coronary artery without angina pectoris: Secondary | ICD-10-CM | POA: Diagnosis not present

## 2018-04-08 DIAGNOSIS — Z85850 Personal history of malignant neoplasm of thyroid: Secondary | ICD-10-CM | POA: Diagnosis not present

## 2018-04-08 DIAGNOSIS — E785 Hyperlipidemia, unspecified: Secondary | ICD-10-CM | POA: Diagnosis not present

## 2018-04-08 NOTE — Telephone Encounter (Signed)
Left patient a message to return call for an aopt.

## 2018-04-09 ENCOUNTER — Telehealth: Payer: Self-pay | Admitting: *Deleted

## 2018-04-09 DIAGNOSIS — D320 Benign neoplasm of cerebral meninges: Secondary | ICD-10-CM | POA: Diagnosis not present

## 2018-04-09 DIAGNOSIS — G3184 Mild cognitive impairment, so stated: Secondary | ICD-10-CM | POA: Diagnosis not present

## 2018-04-09 DIAGNOSIS — Z8673 Personal history of transient ischemic attack (TIA), and cerebral infarction without residual deficits: Secondary | ICD-10-CM | POA: Diagnosis not present

## 2018-04-09 NOTE — Telephone Encounter (Signed)
OK per Dr Tasia Catchings, VO called to [place] - Triage Nurse at hospice

## 2018-04-09 NOTE — Telephone Encounter (Signed)
Palliative care went to see patient and patient requesting hospice services now. Please call or fax order for Hospice care if in agreement

## 2018-04-13 ENCOUNTER — Inpatient Hospital Stay

## 2018-04-13 VITALS — BP 147/60 | HR 60 | Temp 97.1°F | Resp 18

## 2018-04-13 DIAGNOSIS — D509 Iron deficiency anemia, unspecified: Secondary | ICD-10-CM | POA: Diagnosis not present

## 2018-04-13 MED ORDER — SODIUM CHLORIDE 0.9 % IV SOLN
Freq: Once | INTRAVENOUS | Status: AC
Start: 2018-04-13 — End: 2018-04-13
  Administered 2018-04-13: 14:00:00 via INTRAVENOUS
  Filled 2018-04-13: qty 1000

## 2018-04-13 MED ORDER — IRON SUCROSE 20 MG/ML IV SOLN
200.0000 mg | Freq: Once | INTRAVENOUS | Status: AC
  Administered 2018-04-13: 200 mg via INTRAVENOUS
  Filled 2018-04-13: qty 10

## 2018-04-15 ENCOUNTER — Ambulatory Visit (INDEPENDENT_AMBULATORY_CARE_PROVIDER_SITE_OTHER): Payer: Medicare Other | Admitting: Vascular Surgery

## 2018-04-15 ENCOUNTER — Encounter (INDEPENDENT_AMBULATORY_CARE_PROVIDER_SITE_OTHER): Payer: Medicare Other

## 2018-04-20 ENCOUNTER — Inpatient Hospital Stay

## 2018-04-20 VITALS — BP 184/65 | HR 54 | Temp 97.6°F | Resp 18

## 2018-04-20 DIAGNOSIS — D509 Iron deficiency anemia, unspecified: Secondary | ICD-10-CM | POA: Diagnosis not present

## 2018-04-20 MED ORDER — SODIUM CHLORIDE 0.9 % IV SOLN
Freq: Once | INTRAVENOUS | Status: AC
  Administered 2018-04-20: 14:00:00 via INTRAVENOUS
  Filled 2018-04-20: qty 1000

## 2018-04-20 MED ORDER — IRON SUCROSE 20 MG/ML IV SOLN
200.0000 mg | Freq: Once | INTRAVENOUS | Status: AC
  Administered 2018-04-20: 200 mg via INTRAVENOUS
  Filled 2018-04-20: qty 10

## 2018-04-20 NOTE — Progress Notes (Signed)
Notified Dr. Tasia Catchings of Ms. Margaret Romero's  BP of 185/65 after her iron infusion. Ordered to keep patient for another 30 minutes and recheck BP. Systolic BP remained  in the 180's. Patient is asymptomatic. MD reccommended  that patient go home and have BP rechecked. If the patient have any concerns she can call hospice or call back to cancer center. Educated patient of signs and symptoms to look out for. Patient and caregiver verbalized understanding.

## 2018-04-28 ENCOUNTER — Ambulatory Visit: Payer: Medicare Other

## 2018-05-18 ENCOUNTER — Ambulatory Visit: Payer: Medicare Other | Admitting: Psychiatry

## 2018-06-01 ENCOUNTER — Telehealth: Payer: Self-pay

## 2018-06-01 NOTE — Telephone Encounter (Signed)
Spoke with Flonnie (caregiver) and she stated pt is in hospice and will no longer need an AWV. Note made. -MM

## 2018-06-26 ENCOUNTER — Encounter: Payer: Self-pay | Admitting: Family Medicine

## 2018-06-26 ENCOUNTER — Ambulatory Visit: Payer: Self-pay

## 2018-06-29 ENCOUNTER — Ambulatory Visit: Payer: Medicare Other | Admitting: Oncology

## 2018-06-30 DIAGNOSIS — C220 Liver cell carcinoma: Secondary | ICD-10-CM | POA: Diagnosis not present

## 2018-07-30 ENCOUNTER — Telehealth: Payer: Self-pay | Admitting: Family Medicine

## 2018-07-30 NOTE — Telephone Encounter (Signed)
Please advise 

## 2018-07-30 NOTE — Telephone Encounter (Signed)
Margaret Romero with [name] called requesting refill for the following medication:  pantoprazole (PROTONIX) 40 MG tablet  Margaret Romero stated that the last Rx that was sent by Dr. [name] on 12/01/2017 was for 1 tablet once a day. When pt was in the hospital 03/2018 Dr. [name] changed the dose to 1 tablet twice a day. Margaret Romero stated that pt has requested to continue the dosage 1 tablet twice a day. Please advise. Thanks TNP

## 2018-07-31 ENCOUNTER — Other Ambulatory Visit: Payer: Self-pay | Admitting: Family Medicine

## 2018-07-31 DIAGNOSIS — K219 Gastro-esophageal reflux disease without esophagitis: Secondary | ICD-10-CM

## 2018-07-31 MED ORDER — PANTOPRAZOLE SODIUM 40 MG PO TBEC: 40.0000 mg | DELAYED_RELEASE_TABLET | Freq: Two times a day (BID) | ORAL | 11 refills | Status: AC

## 2018-10-01 DIAGNOSIS — C220 Liver cell carcinoma: Secondary | ICD-10-CM | POA: Diagnosis not present

## 2018-10-01 DIAGNOSIS — K5790 Diverticulosis of intestine, part unspecified, without perforation or abscess without bleeding: Secondary | ICD-10-CM | POA: Diagnosis not present

## 2018-10-01 DIAGNOSIS — K5190 Ulcerative colitis, unspecified, without complications: Secondary | ICD-10-CM | POA: Diagnosis not present

## 2018-10-06 ENCOUNTER — Other Ambulatory Visit: Payer: Self-pay | Admitting: General Surgery

## 2018-10-07 NOTE — Telephone Encounter (Signed)
Please review prior to refilling RX thanks

## 2018-10-08 ENCOUNTER — Telehealth: Payer: Self-pay | Admitting: *Deleted

## 2018-10-08 NOTE — Telephone Encounter (Signed)
Patient needs TSH drawn and follow up with Dr Bary Castilla, if she is willing and physically able? She is under Hospice Care (cirrhosis and liver cancer) but she is up and about, still on the go. Appreciates call and concern.

## 2018-10-08 NOTE — Telephone Encounter (Signed)
Patient needs TSH drawn and follow up with Dr Bary Castilla, if she is willing and physically able?

## 2018-11-02 IMAGING — MR MR ABDOMEN WO/W CM
11 of 17 series · 28 of 48 positions shown · IV contrast (10mL MULTIHANCE)
Comparison: Multiple exams, including MRI abdomen 12/18/2017

CLINICAL DATA: Multiple liver lesions, for reassessment.

EXAM:
MRI ABDOMEN WITHOUT AND WITH CONTRAST
TECHNIQUE: Multiplanar multisequence MR imaging of the abdomen was performed
both before and after the administration of intravenous contrast.
CONTRAST:  10mL MULTIHANCE GADOBENATE DIMEGLUMINE 529 MG/ML IV SOLN

[Series 3: T2 · coronal · 8.0mm · 0.78mm/px · 2 of 21 slices shown (1 of 2)]
[im 1/21]
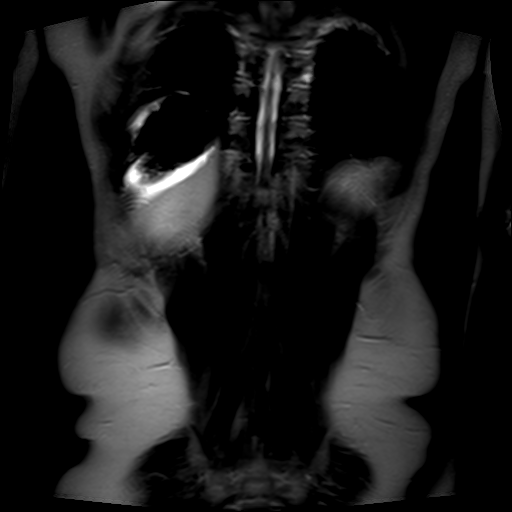
[im 21/21]
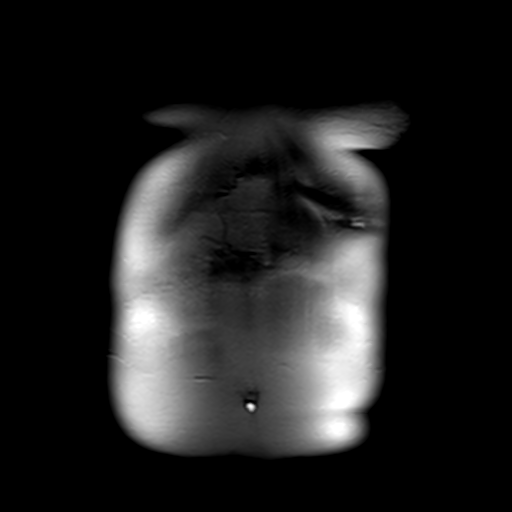

[Series 4: T2 fat-sat · axial · 8.0mm · 0.74mm/px · z∈[+21,+184]mm · 2 of 18 slices shown]
[im 1/18]
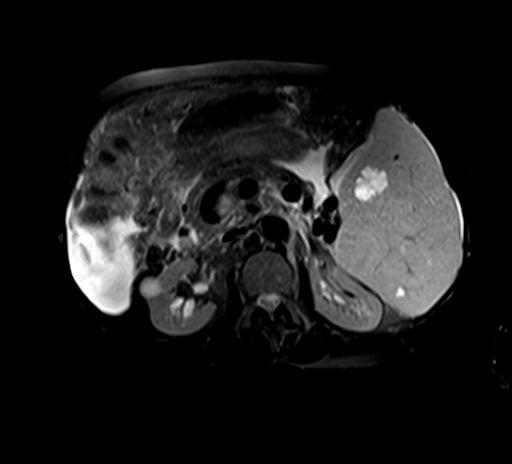
[im 18/18]
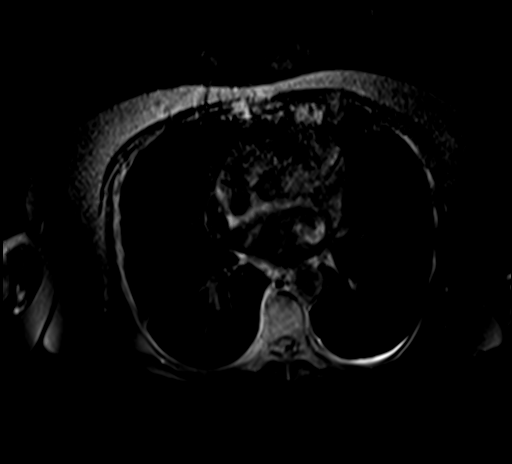

[Series 5: T1 · axial · 8.0mm · 0.74mm/px · z∈[+21,+184]mm · 3 of 36 slices shown]
[im 1/36]
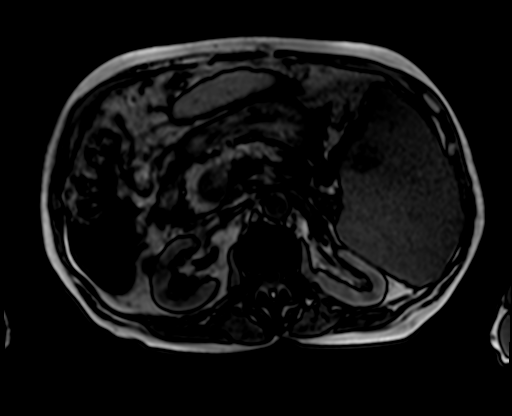
[im 18/36]
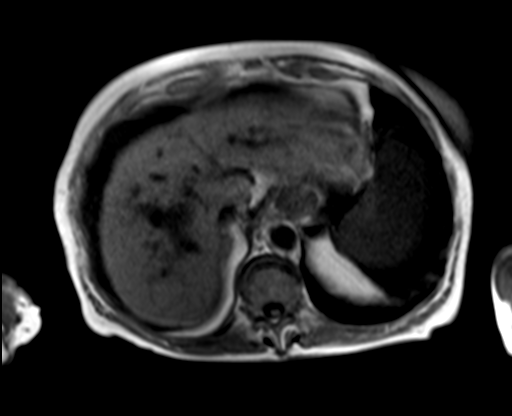
[im 36/36]
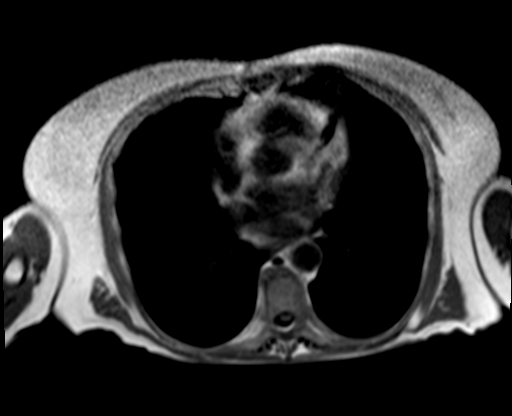

[Series 6: T2 · axial · 8.0mm · 0.74mm/px · 1 of 17 slices shown (2 of 2)]
[im 1/17]
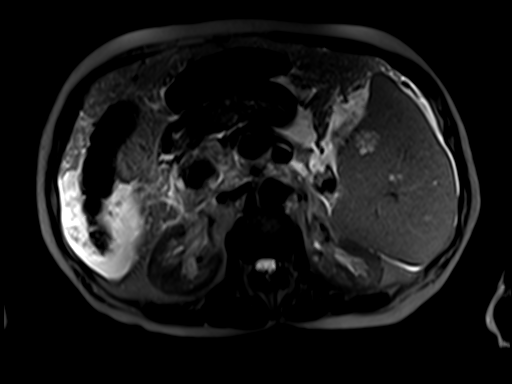

[Series 7: ax fisp · axial · 4.0mm · 0.74mm/px · z∈[+24,+180]mm · 2 of 40 slices shown]
[im 1/40]
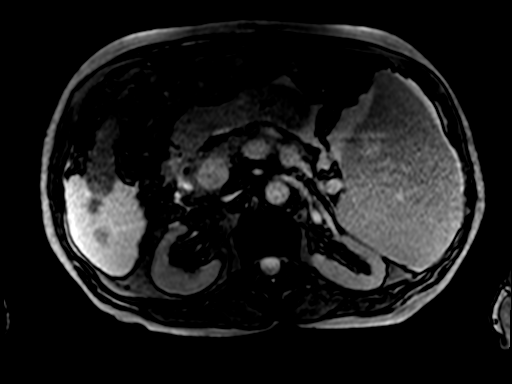
[im 40/40]
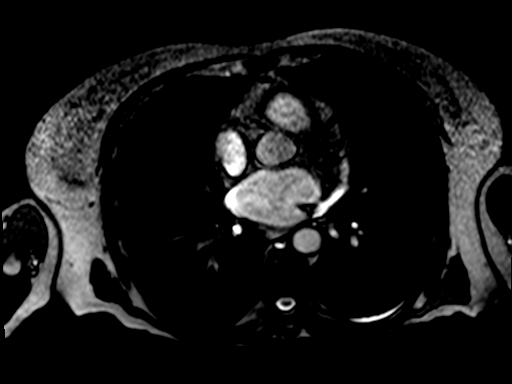

[Series 9: DWI · axial · 6.0mm · 1.98mm/px · z∈[-49,+196]mm · 5 of 104 slices shown]
[im 1/104]
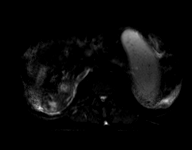
[im 26/104]
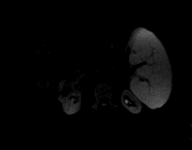
[im 52/104]
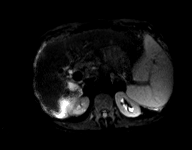
[im 78/104]
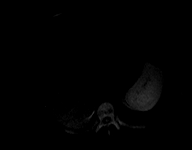
[im 104/104]
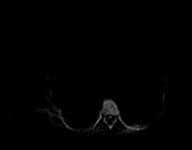

[Series 10: ax dwi_adc · axial · 6.0mm · 1.98mm/px · z∈[-49,+182]mm · 2 of 33 slices shown]
[im 1/33]
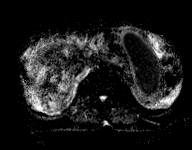
[im 33/33]
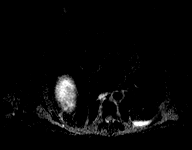

[Series 11: T1 dynamic fat-sat · axial · non-contrast · 2.5mm · 0.74mm/px · z∈[+24,+182]mm · 3 of 64 slices shown (1 of 3)]
[im 1/64]
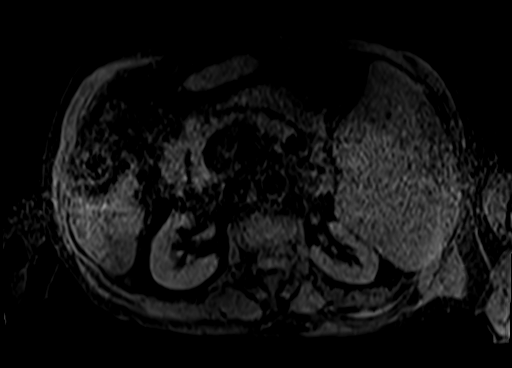
[im 32/64]
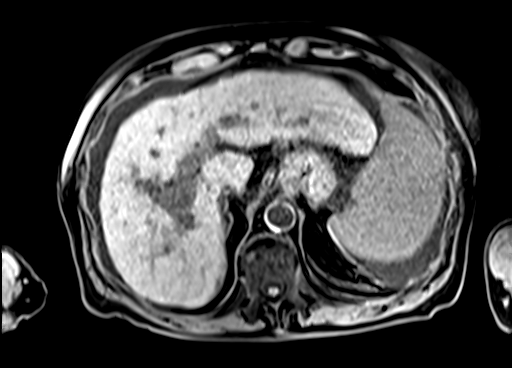
[im 64/64]
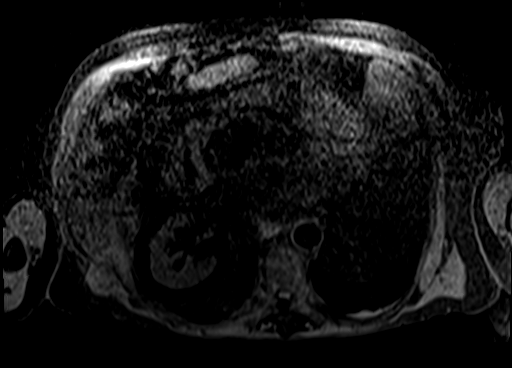

[Series 12: T1 dynamic fat-sat · axial · 2.5mm · 0.74mm/px · z∈[+24,+182]mm · 3 of 64 slices shown (2 of 3)]
[im 1/64]
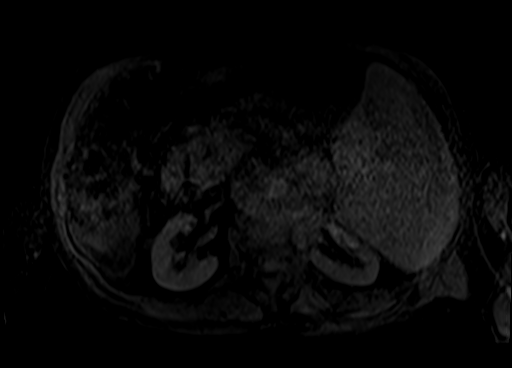
[im 32/64]
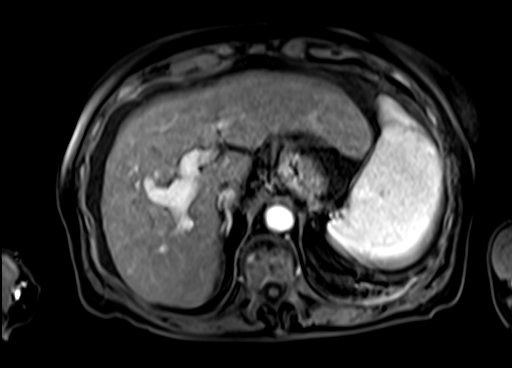
[im 64/64]
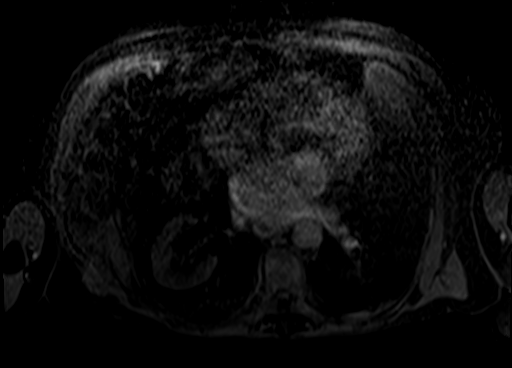

[Series 13: T1 dynamic fat-sat · axial · 2.5mm · 0.74mm/px · z∈[+24,+182]mm · 3 of 64 slices shown (3 of 3)]
[im 1/64]
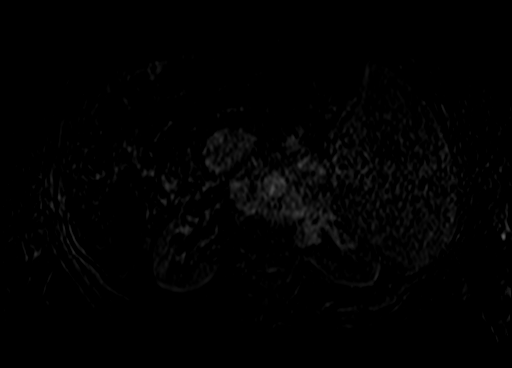
[im 32/64]
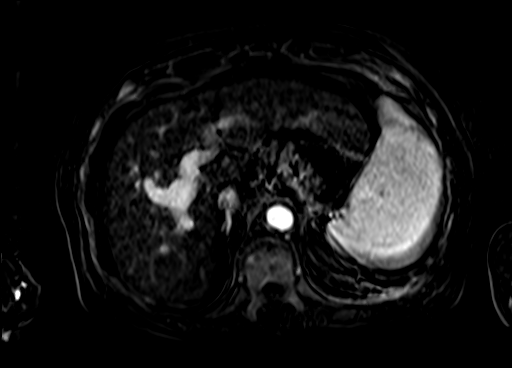
[im 64/64]
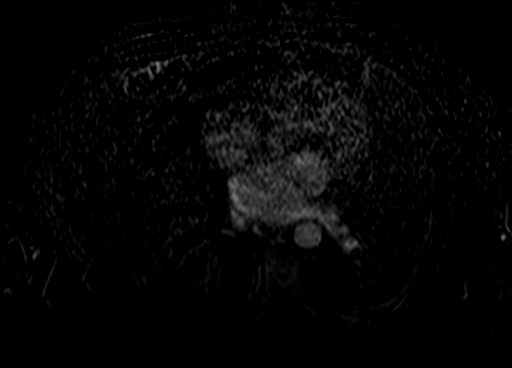

[Series 14: T1 dynamic fat-sat post-contrast · axial · 2.5mm · 0.74mm/px · z∈[+24,+102]mm · 2 of 64 slices shown]
[im 1/64]
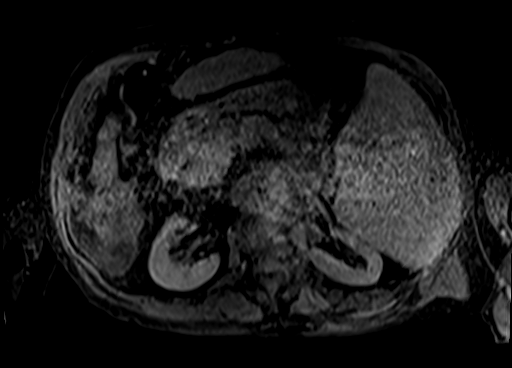
[im 32/64]
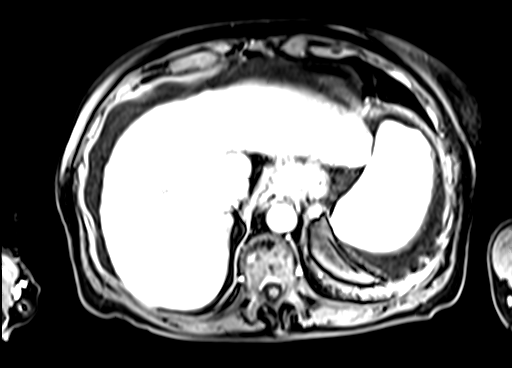

[28 of 48 positions shown; findings below may reference images not displayed]

FINDINGS: Lower chest: Unremarkable

Hepatobiliary: Morphologic findings of cirrhosis. The scattered foci
of accentuated diffusion weighted signal and arterial phase
enhancement are again observed. The lesions also have some low-grade
delayed enhancement. Some of the lesions appear larger than on the
prior exam, for example a segment 3 lesion measuring 1.0 cm on image
92/9, and previously 0.8 cm. Some of the lesions are also more
conspicuous on the high B value images a subcapsular lesion believed
to be in segment 4b of the liver measures 1.8 by 1.1 cm on image
89/9, previously 1.6 by 1.1 cm. These lesions do not meet diagnostic
criteria of threshold growth in the intervening 3 months. Other
lesions are roughly stable in size.

A lesion in segment 2 of the liver measures approximately 2.5 by
cm on image [DATE], thought to be enlarged compared to prior.

Pancreas: Not fully included on today's exam, unremarkable where
seen.

Spleen: The spleen measures 21.0 by 15.9 by 9.8 cm (volume = 4966
cm^3) and contains scattered T2 signal intensity lesions roughly
similar to the prior exam.

Adrenals/Urinary Tract: Right kidney lower pole cyst. No adrenal
mass.

Stomach/Bowel: Periampullary duodenal diverticulum. Malrotation of
the small bowel. Questionable wall thickening in the jejunum.

Vascular/Lymphatic: Small uphill varices adjacent to the distal
esophagus. Aortoiliac atherosclerotic vascular disease.

Other:  Moderate ascites.

Musculoskeletal: Probable hemangiomas in the T10 vertebral body.
IMPRESSION: 1. Multiple foci of restricted diffusion and indistinct arterial
phase enhancement in the liver. Lesions measure up to 1.8 cm,
without definite capsule or washout appearance. Although several
lesions appear to have enlarged, they do not meet the criteria for
threshold growth. Assessment somewhat problematic by due to motion
artifact but given the apparent mild increase in size of several of
these lesions, multifocal hepatocellular carcinoma is the favored
diagnosis and could be confirmed by biopsy.
2. Cirrhosis with marked splenomegaly and scattered stable T2
hyperintense splenic lesions.
3. Increased ascites.
4. Malrotation of the small bowel.
5. Uphill varices compatible with portal venous hypertension.
6.  Aortic Atherosclerosis ([id number]-FB0.0).

## 2018-11-09 IMAGING — DX DG CHEST 1V PORT
1 series · 1 of 1 positions shown · non-contrast
Comparison: Chest CT 03/18/2018 and earlier.

CLINICAL DATA: 71-year-old female status post EGD yesterday for
therapy of esophageal varices. Hypoxia. Portal hypertensive
gastropathy.

EXAM:
PORTABLE CHEST 1 VIEW

[chest ap]
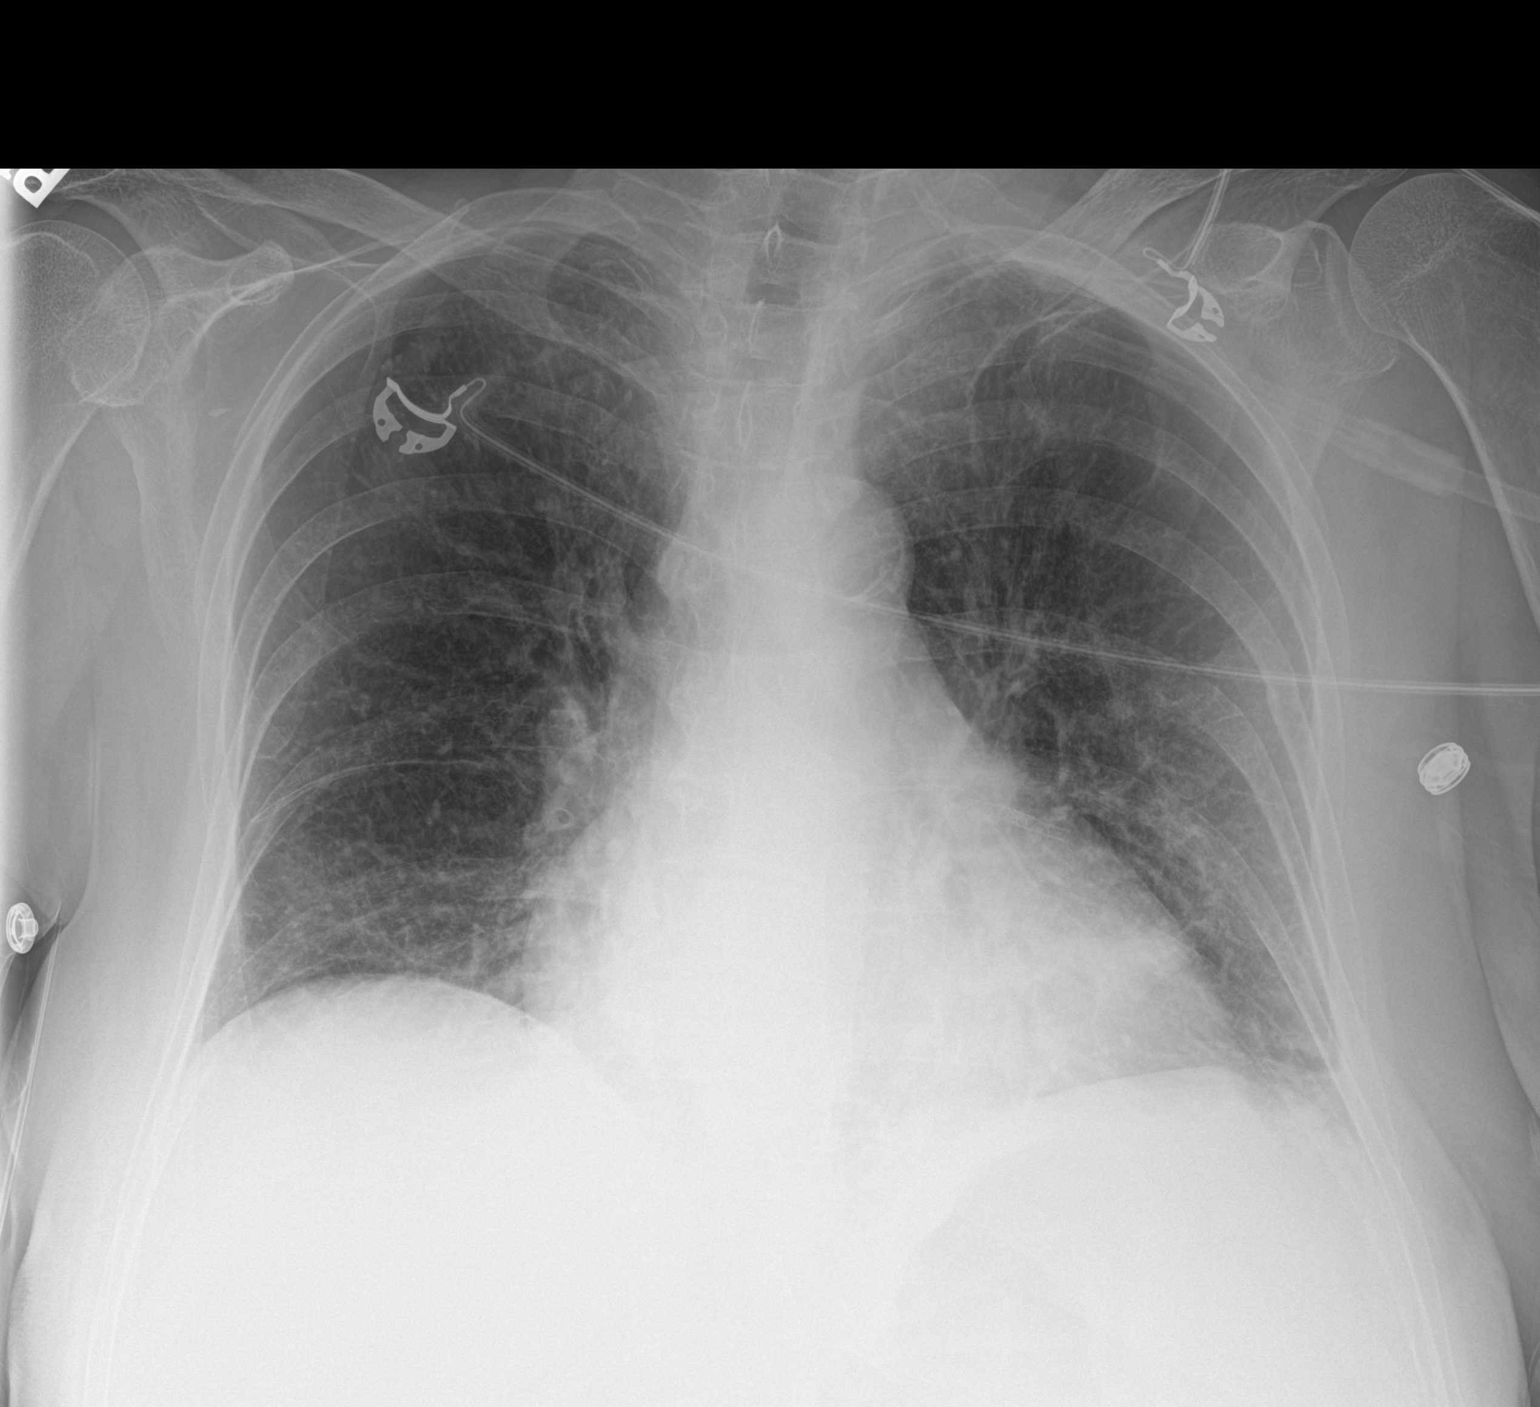

[1 of 1 positions shown; findings below may reference images not displayed]

FINDINGS: Portable AP upright view at 9076 hours. Stable lung volumes. Stable
cardiac size and mediastinal contours. Visualized tracheal air
column is within normal limits. No pneumothorax or pulmonary edema.
No pleural effusion is evident. Patchy and streaky medial lung base
opacity persists, and this most resembles atelectasis on the CT 2
weeks ago. No other confluent pulmonary opacity. Paucity of bowel
gas in the upper abdomen.
IMPRESSION: 1. Persistent bibasilar opacity similar to that on the 03/18/2018 CT
which most resembles atelectasis.
2. No new cardiopulmonary abnormality identified.

## 2018-11-10 IMAGING — CR DG CHEST 2V
1 series · 2 of 2 positions shown · non-contrast
Comparison: 03/31/2018

CLINICAL DATA: Hypoxia

EXAM:
CHEST - 2 VIEW

[Series 1: dg chest 2 view · 0.14mm/px · 2 of 2 slices shown]
[im 1/2]
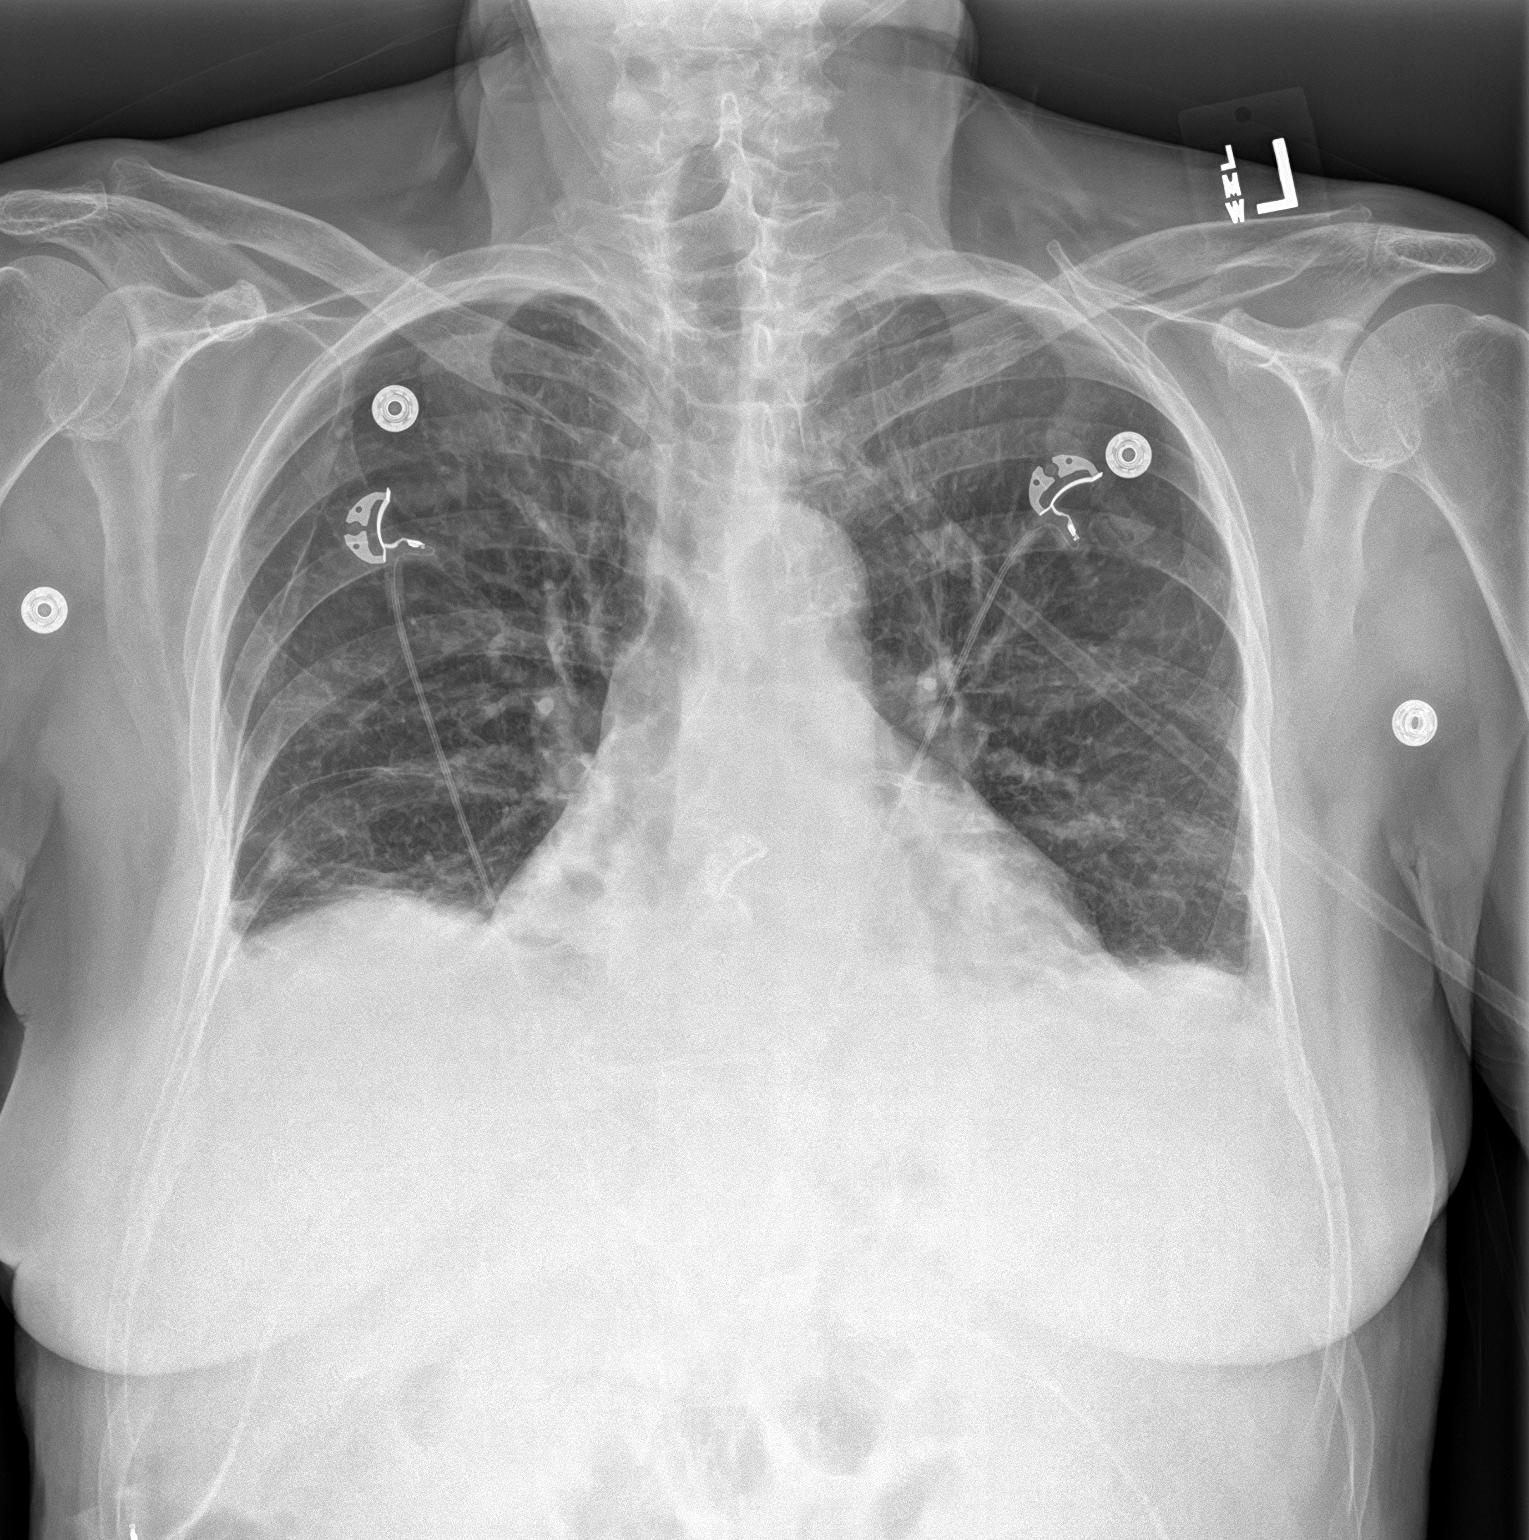
[im 2/2]
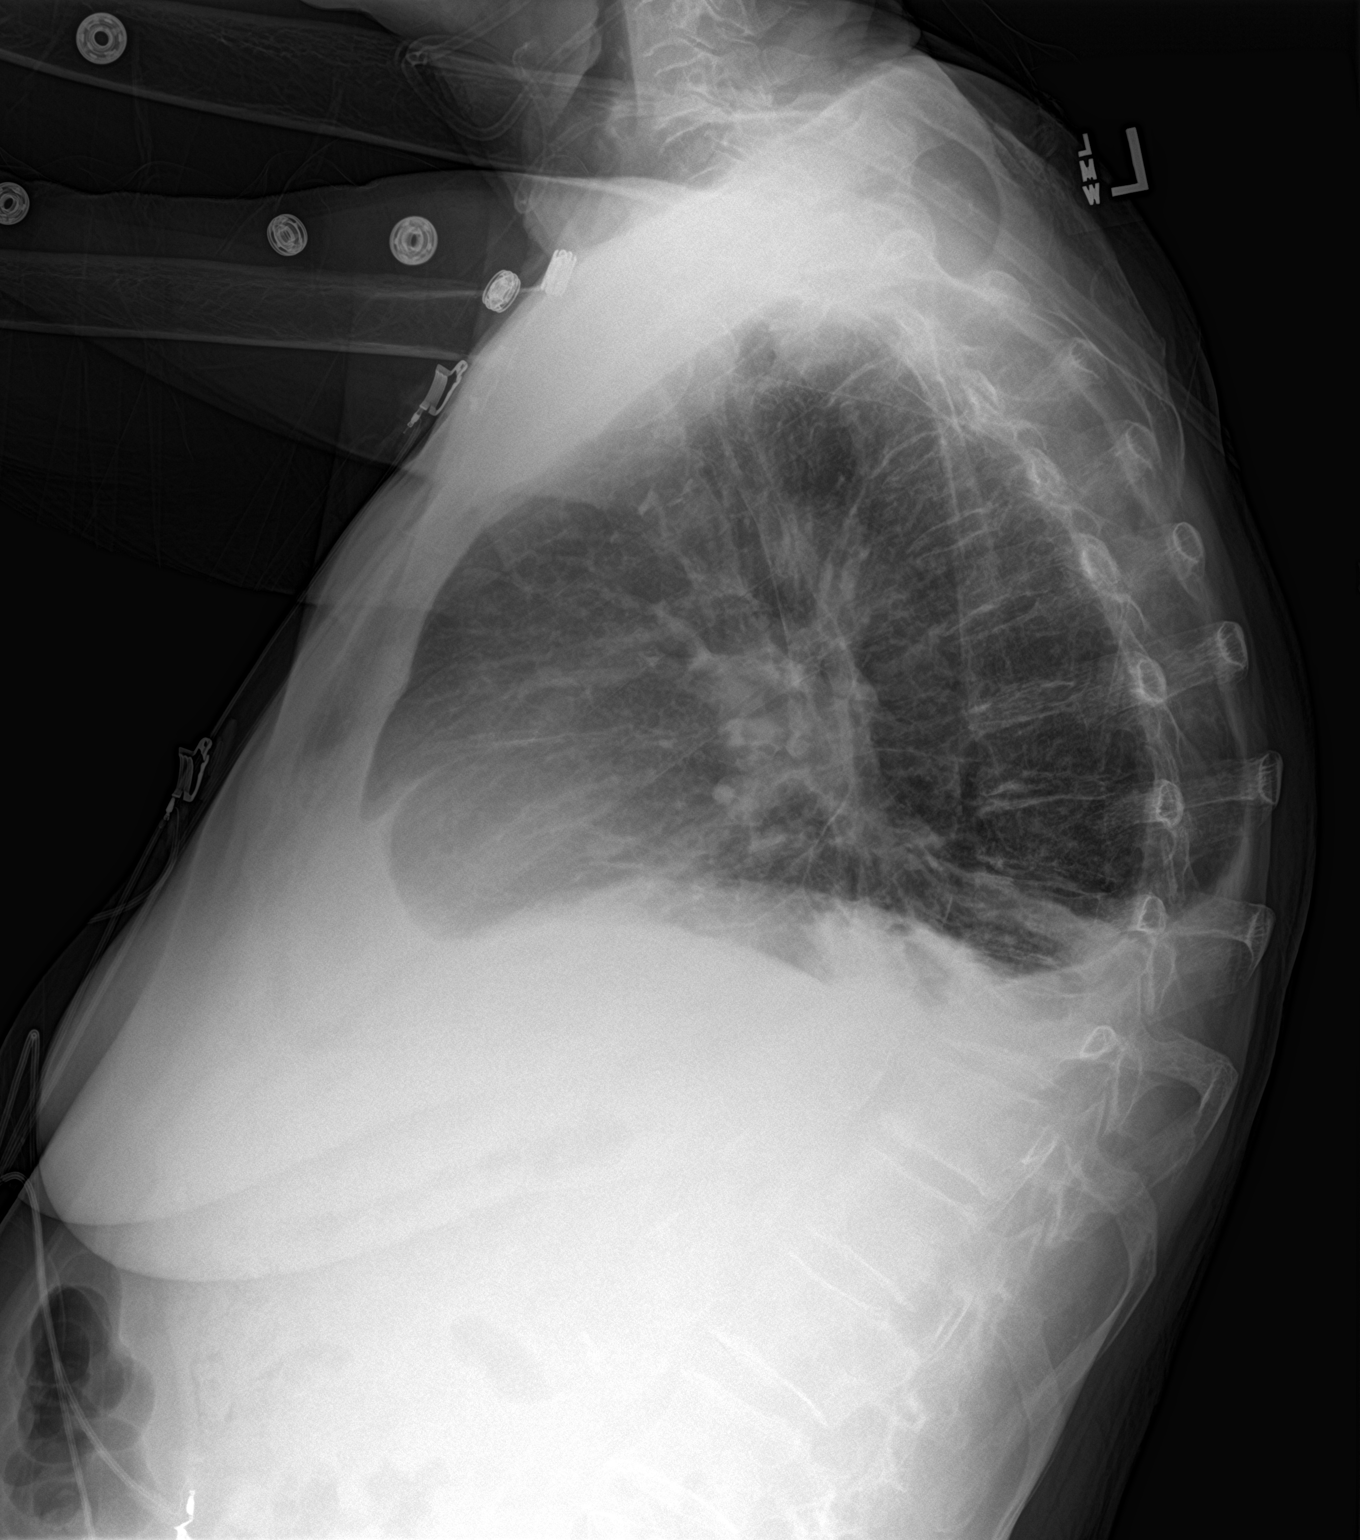

[2 of 2 positions shown; findings below may reference images not displayed]

FINDINGS: Mild bibasilar opacities, likely atelectasis.

Cardiomegaly. Increased interstitial markings raising the
possibility of mild perihilar edema, new.

Suspected small bilateral pleural effusions.  No pneumothorax.
IMPRESSION: Cardiomegaly with possible mild perihilar edema, new. Suspected
small bilateral pleural effusions.

Mild bibasilar opacities, likely atelectasis.

## 2018-11-11 IMAGING — CR DG CHEST 2V
1 series · 2 of 2 positions shown · non-contrast
Comparison: PA and lateral chest x-ray April 01, 2018

CLINICAL DATA: Alcoholic liver cirrhosis, variceal bleeding,
metastatic thyroid malignancy.

EXAM:
CHEST - 2 VIEW

[Series 1: dg chest 2 view · 0.14mm/px · 2 of 2 slices shown]
[im 1/2]
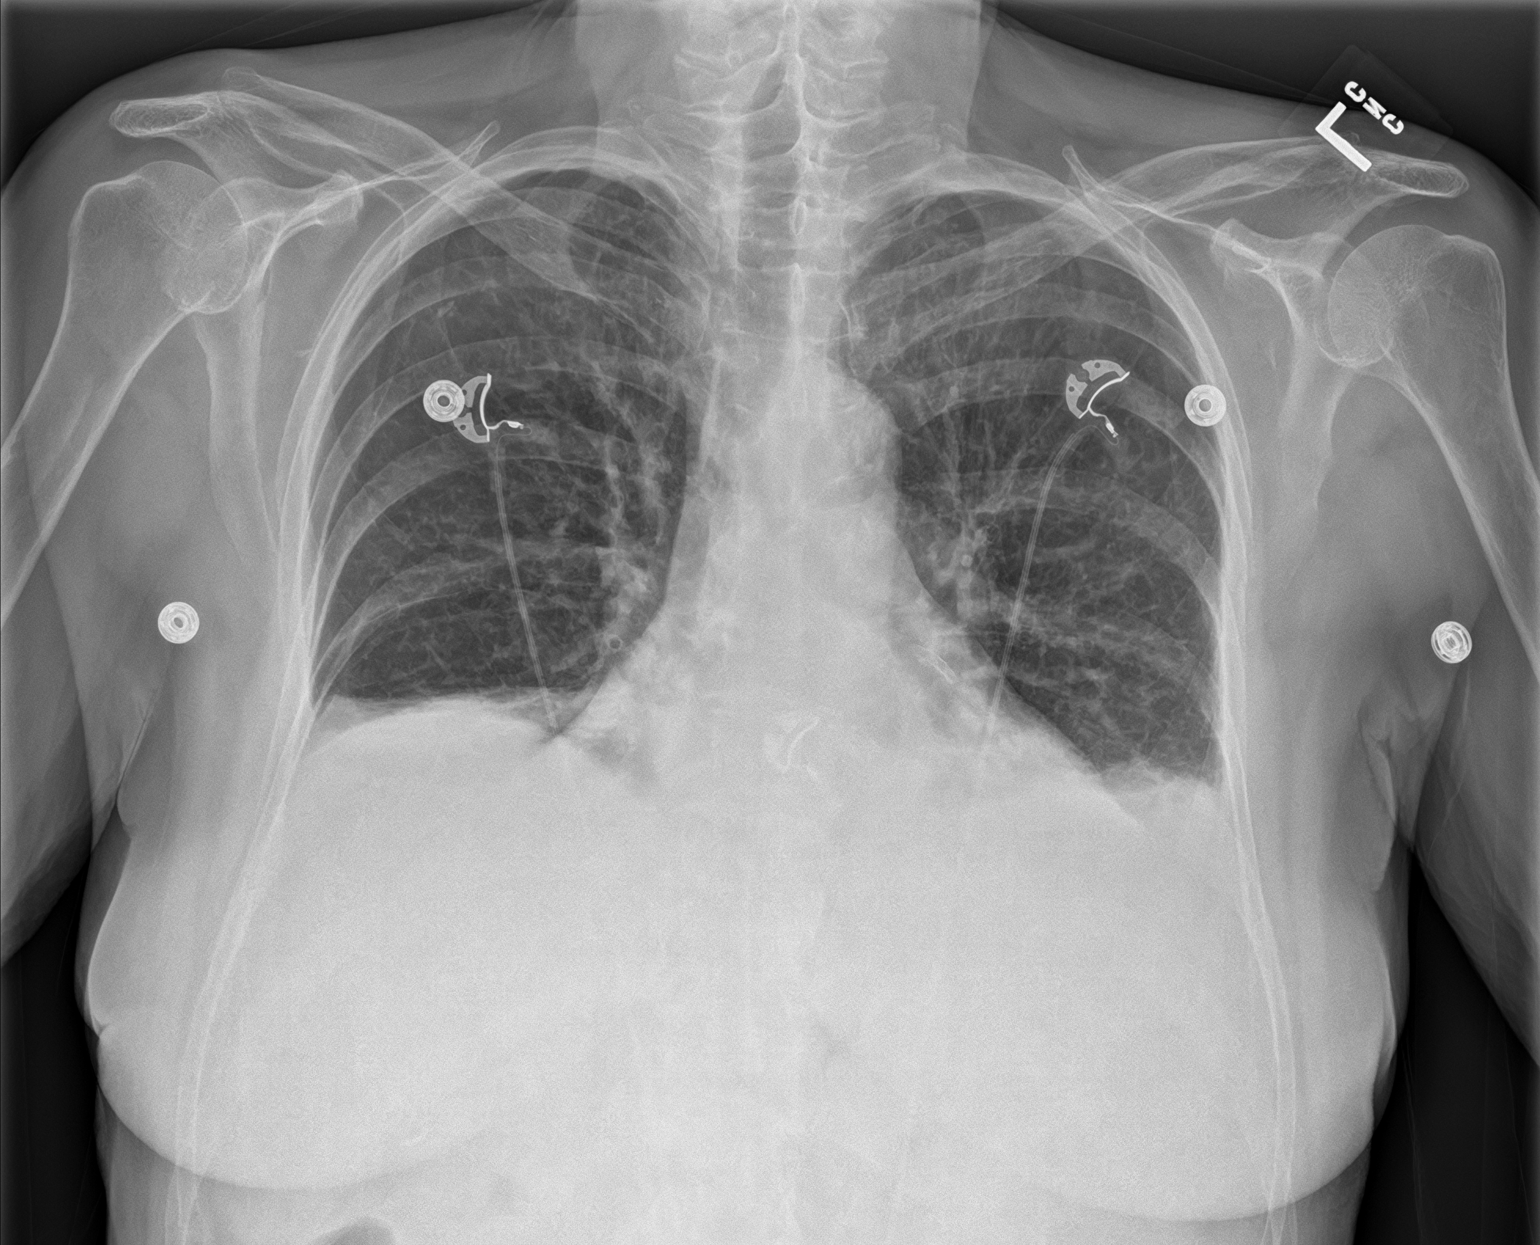
[im 2/2]
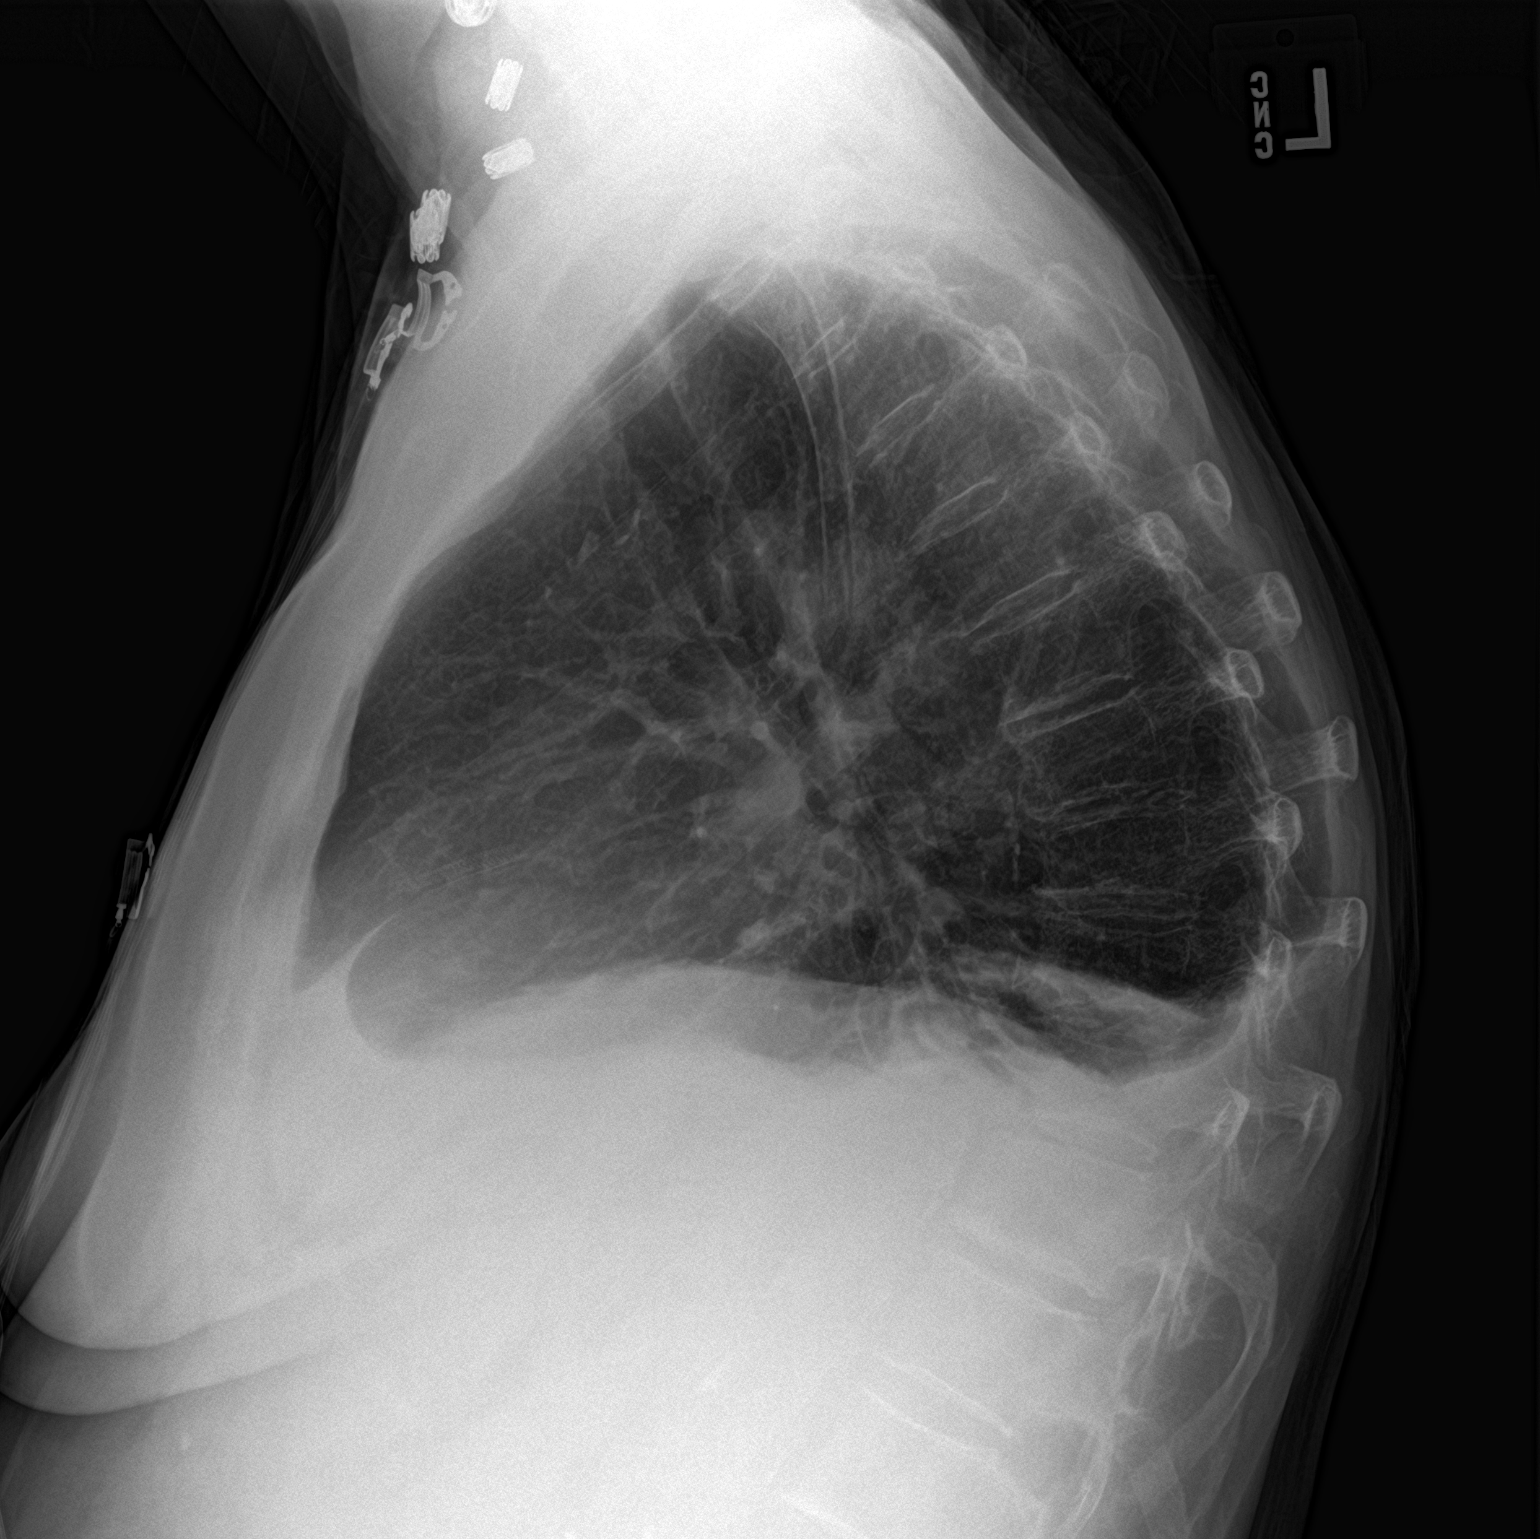

[2 of 2 positions shown; findings below may reference images not displayed]

FINDINGS: The lungs are hypoinflated. There are bibasilar densities similar to
those seen yesterday. The heart is normal in size. The pulmonary
vascularity is not engorged. There is calcification in the wall of
the thoracic aorta. There is prominent thoracic kyphosis.
IMPRESSION: Stable appearance of the chest since yesterday's study. Bibasilar
atelectasis or pneumonia with small bilateral pleural effusions.
Mild central pulmonary vascular prominence.

## 2018-11-23 ENCOUNTER — Other Ambulatory Visit: Payer: Self-pay | Admitting: Psychiatry

## 2019-01-08 ENCOUNTER — Other Ambulatory Visit: Payer: Self-pay | Admitting: Psychiatry

## 2019-01-08 ENCOUNTER — Other Ambulatory Visit: Payer: Self-pay | Admitting: Oncology

## 2019-03-24 ENCOUNTER — Telehealth: Payer: Self-pay | Admitting: *Deleted

## 2019-03-24 NOTE — Telephone Encounter (Signed)
Per Dr Tasia Catchings okay to include MS in the comfort kit

## 2019-03-24 NOTE — Telephone Encounter (Signed)
Hospice called asking if Dr Tasia Catchings wants MS in the comfort Kit ordered yesterday. Please advise

## 2019-03-24 NOTE — Telephone Encounter (Signed)
Hospice informed

## 2019-03-24 NOTE — Telephone Encounter (Signed)
Margaret Romero please recheck phone number for hospice nurse, this number is incorrect.

## 2019-05-13 ENCOUNTER — Other Ambulatory Visit: Payer: Self-pay | Admitting: Family Medicine

## 2019-05-13 DIAGNOSIS — I2510 Atherosclerotic heart disease of native coronary artery without angina pectoris: Secondary | ICD-10-CM

## 2019-07-02 ENCOUNTER — Other Ambulatory Visit: Payer: Self-pay | Admitting: Psychiatry

## 2019-07-14 ENCOUNTER — Other Ambulatory Visit: Payer: Self-pay | Admitting: Oncology

## 2019-07-19 NOTE — Telephone Encounter (Signed)
Patient was on hospice.

## 2019-09-07 ENCOUNTER — Other Ambulatory Visit: Payer: Self-pay | Admitting: Oncology

## 2019-09-09 ENCOUNTER — Other Ambulatory Visit: Payer: Self-pay | Admitting: Oncology

## 2019-10-05 ENCOUNTER — Other Ambulatory Visit: Payer: Self-pay | Admitting: Oncology

## 2019-10-12 ENCOUNTER — Other Ambulatory Visit: Payer: Self-pay | Admitting: Psychiatry

## 2019-10-13 ENCOUNTER — Telehealth: Payer: Self-pay | Admitting: *Deleted

## 2019-10-13 NOTE — Telephone Encounter (Signed)
Patient has fallen 3 times in past 3 weeks and is having pain in her right leg. Requesting order for mobile xray of her hip and leg. Please advise

## 2019-10-14 ENCOUNTER — Other Ambulatory Visit: Payer: Self-pay | Admitting: Hospice and Palliative Medicine

## 2019-10-14 DIAGNOSIS — M25551 Pain in right hip: Secondary | ICD-10-CM

## 2019-10-14 NOTE — Progress Notes (Signed)
Will order imaging of R. Hip/knee due to reported pain after falls at home.

## 2019-10-14 NOTE — Telephone Encounter (Signed)
[facility] ORDER called to [facility] per secure chat with Sharion Dove, NP. She requests that a written order be faxed to [facility] and gave fax # [phone]. Please send order for xray of right hip and knee to them ASAP

## 2019-10-14 NOTE — Telephone Encounter (Signed)
Orders faxed

## 2019-10-15 DIAGNOSIS — R52 Pain, unspecified: Secondary | ICD-10-CM | POA: Diagnosis not present

## 2019-10-15 DIAGNOSIS — R5381 Other malaise: Secondary | ICD-10-CM | POA: Diagnosis not present

## 2019-10-15 DIAGNOSIS — Z7401 Bed confinement status: Secondary | ICD-10-CM | POA: Diagnosis not present

## 2019-10-15 DIAGNOSIS — M2550 Pain in unspecified joint: Secondary | ICD-10-CM | POA: Diagnosis not present

## 2019-10-15 DIAGNOSIS — R0902 Hypoxemia: Secondary | ICD-10-CM | POA: Diagnosis not present

## 2020-01-03 DEATH — deceased
